# Patient Record
Sex: Female | Born: 1935 | Race: White | Hispanic: No | Smoking: Former smoker
Health system: Southern US, Community
[De-identification: ages and names within clinical notes are randomized; demographics above are authoritative.]

## PROBLEM LIST (undated history)

## (undated) DIAGNOSIS — I10 Essential (primary) hypertension: Secondary | ICD-10-CM

## (undated) DIAGNOSIS — F329 Major depressive disorder, single episode, unspecified: Secondary | ICD-10-CM

## (undated) DIAGNOSIS — K219 Gastro-esophageal reflux disease without esophagitis: Secondary | ICD-10-CM

## (undated) DIAGNOSIS — F419 Anxiety disorder, unspecified: Secondary | ICD-10-CM

## (undated) DIAGNOSIS — K5790 Diverticulosis of intestine, part unspecified, without perforation or abscess without bleeding: Secondary | ICD-10-CM

## (undated) DIAGNOSIS — G4730 Sleep apnea, unspecified: Secondary | ICD-10-CM

## (undated) DIAGNOSIS — E079 Disorder of thyroid, unspecified: Secondary | ICD-10-CM

## (undated) DIAGNOSIS — M1990 Unspecified osteoarthritis, unspecified site: Secondary | ICD-10-CM

## (undated) DIAGNOSIS — C801 Malignant (primary) neoplasm, unspecified: Secondary | ICD-10-CM

## (undated) DIAGNOSIS — F32A Depression, unspecified: Secondary | ICD-10-CM

## (undated) DIAGNOSIS — R0602 Shortness of breath: Secondary | ICD-10-CM

## (undated) HISTORY — DX: Unspecified osteoarthritis, unspecified site: M19.90

## (undated) HISTORY — PX: JOINT REPLACEMENT: SHX530

## (undated) HISTORY — PX: ABDOMINAL HYSTERECTOMY: SHX81

## (undated) HISTORY — PX: TUBAL LIGATION: SHX77

## (undated) HISTORY — DX: Disorder of thyroid, unspecified: E07.9

## (undated) HISTORY — DX: Essential (primary) hypertension: I10

---

## 1974-09-25 HISTORY — PX: OTHER SURGICAL HISTORY: SHX169

## 1981-09-25 HISTORY — PX: THYROID SURGERY: SHX805

## 1988-09-25 HISTORY — PX: OTHER SURGICAL HISTORY: SHX169

## 1990-09-25 HISTORY — PX: OTHER SURGICAL HISTORY: SHX169

## 1995-09-26 HISTORY — PX: OTHER SURGICAL HISTORY: SHX169

## 1999-09-26 HISTORY — PX: BREAST SURGERY: SHX581

## 2000-08-13 ENCOUNTER — Encounter: Admission: RE | Admit: 2000-08-13 | Discharge: 2000-11-11 | Payer: Self-pay | Admitting: *Deleted

## 2002-09-25 HISTORY — PX: OTHER SURGICAL HISTORY: SHX169

## 2003-01-29 ENCOUNTER — Encounter: Payer: Self-pay | Admitting: Orthopedic Surgery

## 2003-02-05 ENCOUNTER — Observation Stay (HOSPITAL_COMMUNITY): Admission: RE | Admit: 2003-02-05 | Discharge: 2003-02-06 | Payer: Self-pay | Admitting: Orthopedic Surgery

## 2004-04-21 ENCOUNTER — Encounter: Payer: Self-pay | Admitting: Orthopedic Surgery

## 2004-05-06 ENCOUNTER — Encounter: Payer: Self-pay | Admitting: Orthopedic Surgery

## 2004-05-06 ENCOUNTER — Inpatient Hospital Stay (HOSPITAL_COMMUNITY): Admission: RE | Admit: 2004-05-06 | Discharge: 2004-05-12 | Payer: Self-pay | Admitting: Orthopedic Surgery

## 2004-05-12 ENCOUNTER — Inpatient Hospital Stay: Admission: AD | Admit: 2004-05-12 | Discharge: 2004-05-27 | Payer: Self-pay | Admitting: Internal Medicine

## 2004-08-04 ENCOUNTER — Ambulatory Visit: Payer: Self-pay | Admitting: Orthopedic Surgery

## 2004-11-07 ENCOUNTER — Ambulatory Visit: Payer: Self-pay | Admitting: Orthopedic Surgery

## 2005-01-23 ENCOUNTER — Ambulatory Visit: Payer: Self-pay | Admitting: Orthopedic Surgery

## 2005-05-08 ENCOUNTER — Ambulatory Visit: Payer: Self-pay | Admitting: Orthopedic Surgery

## 2006-04-18 ENCOUNTER — Ambulatory Visit: Payer: Self-pay | Admitting: Orthopedic Surgery

## 2006-07-31 ENCOUNTER — Ambulatory Visit: Payer: Self-pay | Admitting: Gastroenterology

## 2006-08-22 ENCOUNTER — Ambulatory Visit: Payer: Self-pay | Admitting: Gastroenterology

## 2006-08-22 ENCOUNTER — Ambulatory Visit (HOSPITAL_COMMUNITY): Admission: RE | Admit: 2006-08-22 | Discharge: 2006-08-22 | Payer: Self-pay | Admitting: Gastroenterology

## 2006-09-13 ENCOUNTER — Ambulatory Visit: Payer: Self-pay | Admitting: Gastroenterology

## 2007-03-18 ENCOUNTER — Ambulatory Visit: Payer: Self-pay | Admitting: Orthopedic Surgery

## 2007-05-08 ENCOUNTER — Ambulatory Visit: Payer: Self-pay | Admitting: Orthopedic Surgery

## 2007-06-24 ENCOUNTER — Ambulatory Visit: Payer: Self-pay | Admitting: Orthopedic Surgery

## 2007-06-25 ENCOUNTER — Encounter: Payer: Self-pay | Admitting: Orthopedic Surgery

## 2008-05-28 ENCOUNTER — Ambulatory Visit: Payer: Self-pay | Admitting: Orthopedic Surgery

## 2008-05-28 DIAGNOSIS — M25569 Pain in unspecified knee: Secondary | ICD-10-CM | POA: Insufficient documentation

## 2008-05-28 DIAGNOSIS — M4120 Other idiopathic scoliosis, site unspecified: Secondary | ICD-10-CM | POA: Insufficient documentation

## 2008-05-28 DIAGNOSIS — M545 Low back pain, unspecified: Secondary | ICD-10-CM | POA: Insufficient documentation

## 2008-05-28 DIAGNOSIS — Z96659 Presence of unspecified artificial knee joint: Secondary | ICD-10-CM | POA: Insufficient documentation

## 2008-06-11 ENCOUNTER — Encounter: Payer: Self-pay | Admitting: Orthopedic Surgery

## 2008-07-04 ENCOUNTER — Encounter: Payer: Self-pay | Admitting: Orthopedic Surgery

## 2008-07-13 ENCOUNTER — Ambulatory Visit: Payer: Self-pay | Admitting: Orthopedic Surgery

## 2008-07-13 DIAGNOSIS — M5430 Sciatica, unspecified side: Secondary | ICD-10-CM | POA: Insufficient documentation

## 2008-07-27 ENCOUNTER — Ambulatory Visit: Payer: Self-pay | Admitting: Orthopedic Surgery

## 2008-10-07 ENCOUNTER — Ambulatory Visit: Payer: Self-pay | Admitting: Orthopedic Surgery

## 2008-10-09 ENCOUNTER — Telehealth: Payer: Self-pay | Admitting: Orthopedic Surgery

## 2008-10-13 ENCOUNTER — Ambulatory Visit (HOSPITAL_COMMUNITY): Admission: RE | Admit: 2008-10-13 | Discharge: 2008-10-13 | Payer: Self-pay | Admitting: Orthopedic Surgery

## 2008-10-19 ENCOUNTER — Ambulatory Visit: Payer: Self-pay | Admitting: Orthopedic Surgery

## 2008-10-19 DIAGNOSIS — M5137 Other intervertebral disc degeneration, lumbosacral region: Secondary | ICD-10-CM | POA: Insufficient documentation

## 2009-04-05 ENCOUNTER — Ambulatory Visit: Payer: Self-pay | Admitting: Orthopedic Surgery

## 2010-01-10 ENCOUNTER — Ambulatory Visit: Payer: Self-pay | Admitting: Cardiology

## 2010-04-29 ENCOUNTER — Encounter: Payer: Self-pay | Admitting: Orthopedic Surgery

## 2010-05-11 ENCOUNTER — Ambulatory Visit: Payer: Self-pay | Admitting: Orthopedic Surgery

## 2010-05-11 DIAGNOSIS — IMO0002 Reserved for concepts with insufficient information to code with codable children: Secondary | ICD-10-CM | POA: Insufficient documentation

## 2010-05-11 DIAGNOSIS — M1710 Unilateral primary osteoarthritis, unspecified knee: Secondary | ICD-10-CM

## 2010-10-11 ENCOUNTER — Ambulatory Visit
Admission: RE | Admit: 2010-10-11 | Discharge: 2010-10-11 | Payer: Self-pay | Source: Home / Self Care | Attending: Orthopedic Surgery | Admitting: Orthopedic Surgery

## 2010-10-25 NOTE — Letter (Signed)
Summary: Medical record request [facility]   Medical record request [facility]   Imported By: Cammie Sickle 05/17/2010 12:01:04  _____________________________________________________________________  External Attachment:    Type:   Image     Comment:   External Document

## 2010-10-25 NOTE — Assessment & Plan Note (Signed)
Summary: 1 yr xr tka/blue med/medicaid/bsf   Visit Type:  Follow-up  CC:  right knee TKA.  History of Present Illness: This is a 77 year old female who had a RIGHT total knee done in August of 2005 and presents back for her annual followup.  She had a [employer] knee put in  Xrays today.  Medications: updated in meds list. She is also taking One a day vitamins, Calcium , D-3, and Loratadine 10mg .  She complains of pain in her LEFT knee with some some discomfort swelling and tightness.  She feels is swollen.  She's having some giving way episodes and requires the use of a cane.  She would like an injection in the LEFT knee  The RIGHT knee gets a little sore at times but has been functioning well for 6 years.  Allergies: No Known Drug Allergies  Past History:  Past Medical History: Last updated: 07/13/2008 arthritis htn thyroid  Past Surgical History: Last updated: 07/13/2008 tubal ligation broken rt ankle 1976 broken radius ulna 1990 thyroid surgery 1983 rt foot bone spur removed 1992 arthroscopic surgery rt knee 1997 left knee 2004 breast surgery 2001 left/ cancer  Physical Exam  Additional Exam:  this is a well developed well-nourished female in no acute distress she is awake alert nor at x3 her mood and affect are normal.  She ambulates with a cane with a slight waddling gait.  She has increased lumbar lordosis.  Her RIGHT knee has approximately 110 of flexion with full extension.  The knee is stable in the anteroposterior plane as well as the medial lateral plane.  She has good strength in the quadriceps.  Her skin incision is normal with no tenderness.  She has minimal peripheral edema normal temperature and her RIGHT leg and her sensation is normal  The LEFT leg may have a small joint effusion flexion is 105 there is full extension with medial and lateral joint line tenderness.  The ACL and PCL are stable collateral ligaments are normal.  There is normal strength  skin is normal.  Temperature LEFT leg normal.  No edema.   Impression & Recommendations:  Problem # 1:  TOTAL KNEE REPLACEMENT, RIGHT, HX OF (ICD-V43.65) Assessment Comment Only  RIGHT knee  The  alignment was normal, PTF reduced without tilt or subluxation, no evidence of loosening.  IMPRESSION: normal appearance of the implant   Orders: Knee x-ray,  3 views (73710)  Problem # 2:  OSTEOARTHRITIS, KNEE, LEFT (ICD-715.96) Assessment: Deteriorated  LEFT knee injection Verbal consent was obtained. The knee was prepped with alcohol and ethyl chloride. 1 cc of depomedrol 40mg /cc and 4 cc of lidocaine 1% was injected. there were no complications.  Other Orders: Est. Patient Level IV (62694) Joint Aspirate / Injection, Large (20610) Depo- Medrol 40mg  (J1030)  Patient Instructions: 1)  Please schedule a follow-up appointment in 1 year. 2)  xray  Appended Document: 1 yr xr tka/blue med/medicaid/bsf review of systems.  The patient tells me that she's been having severe swelling episodes and went to see the gynecologist and he and he took some blood work and we'll work that up for her.  She is also in the 10th year post lumpectomy for breast cancer and has been cancer free and we'll see her surgeon regarding followup for that.  As far as her back goes she's had minimal to no discomfort in her lumbar spine.

## 2010-10-27 NOTE — Assessment & Plan Note (Signed)
Summary: left knee pain/no xrays [email or web address]   Visit Type:  Follow-up Primary Provider:  Dr. Garner Nash  CC:  left knee pain.  History of Present Illness: I saw [name] in the office today for a followup visit.  She is a 77 years old woman with the complaint of:  Left knee pain, hip and leg pain.  Last injection was in August 2011.  MRI L spine for review from [facility] 10/13/08, we ordered.  Had pain from her left hip to her ankle, used ice, heat, and PCP gave her cortisone injection in hip and this helps.  Has some back pain.  No back surgery in the past, no ESI's.  Takes Ibuprofen 800mg  two times a day and this helps, did take Tylenol Arthritis whe she had flare up did not help.  Pain level today is 0.  Her knee looks great.  excellent range of motion no tenderness pain or swelling has a negative straight leg raise.  Assessment probable bursitis since the injection helped but al involvement with pain along the lateral leg radiating from the hip  Patient will followup as scheduled for annual knee film on the RIGHT knee   Allergies: No Known Drug Allergies   Impression & Recommendations:  Problem # 1:  OSTEOARTHRITIS, KNEE, LEFT (ICD-715.96)  Orders: Est. Patient Level II (60454)  Problem # 2:  SCIATICA (ICD-724.3)  Orders: Est. Patient Level II (09811)  Patient Instructions: 1)  keep appt for august [tka xrays]   Orders Added: 1)  Est. Patient Level II [91478]

## 2011-02-10 NOTE — Op Note (Signed)
[name], [name]                        ACCOUNT NO.:  [id number]   MEDICAL RECORD NO.:  [id number]                   PATIENT TYPE:  OBV   LOCATION:  0443                                 FACILITY:  [facility]   PHYSICIAN:  Marlowe Kays, M.D.               DATE OF BIRTH:  1930/02/08   DATE OF PROCEDURE:  02/05/2003  DATE OF DISCHARGE:                                 OPERATIVE REPORT   PREOPERATIVE DIAGNOSES:  1. Torn lateral and possible torn medial menisci, left knee.  2. Degenerative arthritis, left knee.   POSTOPERATIVE DIAGNOSES:  1. Torn medial and lateral menisci.  2. Osteoarthritis, left knee.   OPERATION:  Left knee arthroscopy with 1) partial and medial lateral  meniscectomy, 2) shaving of the medial femoral condyle, lateral femoral  condyle and lateral tibial plateau.   SURGEON:  Marlowe Kays, M.D.   ASSISTANT:  Nurse.   ANESTHESIA:  General.   PATHOLOGY AND JUSTIFICATION FOR PROCEDURE:  She has had some chronic left  knee pain with MRI demonstrating possible torn lateral meniscus and medial  meniscus, also there is a popliteal cyst present.  She was also felt to  possibly have lateral subluxation of the patella but on subluxation films,  it was tracking normally. At surgery, I found a complex tear of the  posterior curve and horn of the medial meniscus, badly lacerated and  macerated lateral meniscus involving almost its entirety and grade 2-3  chondromalacia out of most of the medial femoral condyle, lateral femoral  condyle and grade 3/4 lateral tibial plateau. She also had some grade 2-3  wear of the patella.   DESCRIPTION OF PROCEDURE:  Satisfactory general anesthesia, Ace wrap to  right leg, pneumatic tourniquet, thigh stabilizer to the left leg which was  prepped with Duraprep from stabilizer to ankle, draped with Duraprep and  draped in a sterile field. I started out with the usual anterior medial and  lateral portals at the joint line and the  superior and medial portal for  inflow but because of problems getting good flow, I ended up with an  accessory superolateral portal as well. First through an anterolateral  portal, I evaluated the medial compartment of the knee joint, she had  partial detachment of a good bit of articular cartilage which was pictured  and smoothed down with a 3.5 shaver. The posterior horn tear of the medial  meniscus was noted and trimmed back with baskets and then shaved down until  smooth with a 3.5 shaver, final pictures being taken. I was unable to get my  scope up into the suprapatellar area from this portal and consequently  reversed portals. Her ACL was intact, she had a badly macerated lateral  meniscus involving the both the anterior, mid and posterior portions. A good  bit of this was resected with scissors and then remaining meniscus trimmed  back with baskets and shaved down until smooth with  a 3.5 shaver, final  pictures being taken. Also I shaved down the lateral femoral condyle and  also debrided up the lateral tibial plateau as well. I likewise tried to get  up in the lateral gutter and suprapatellar area from this portal as well and  was unable to do so, so I used the superomedial portal and used a  superolateral inflow portal to visualize the patella which had some wear but  nothing shaveable. The knee joint was then irrigated until clear and all  fluid possible removed. The two anterior portals were closed with 4-0 nylon.  20 mL of 0.5% Marcaine with adrenaline was then instilled through the in  flap rasp which was removed. The three portals were closed with 4-0 nylon  and I then closed the final portal with 4-0 nylon as well. Betadine Adaptic  dry sterile dressing were applied, tourniquet was released. The patient  tolerated the procedure well and was taken to the recovery room in  satisfactory condition with no known complications.                                                Marlowe Kays, M.D.    JA/MEDQ  D:  02/05/2003  T:  02/05/2003  Job:  [id number]

## 2011-02-10 NOTE — Consult Note (Signed)
[name], [name]            ACCOUNT NO.:  [id number]   MEDICAL RECORD NO.:  [id number]          PATIENT TYPE:  AMB   LOCATION:                                FACILITY:  [facility]   PHYSICIAN:  Kassie Mends, M.D.      DATE OF BIRTH:  1930/07/17   DATE OF CONSULTATION:  07/31/2006  DATE OF DISCHARGE:                                   CONSULTATION   REQUESTING PHYSICIAN:  Dr. Reuel Boom.   REASON FOR CONSULTATION:  Abdominal bloating.   HISTORY OF PRESENT ILLNESS:  [name] is 77 year old Caucasian female  referred through the courtesy of Dr. Reuel Boom with history of significant  abdominal bloating.  She also complains of intermittent diarrhea.  She had  been seen by Dr. Reuel Boom for this previously.  She underwent a CT scan of  abdomen with contrast on July 02, 2006.  She was found to have a small  hiatal hernia.  She was seen to have thickening of the gastric cardia.  She  was also found to have diverticulosis without evidence of diverticulitis,  cysts involving both ovaries and a small spiculated density in the left  breast status post left breast lumpectomy.  Given abnormal findings on CT,  she then underwent EGD by Dr. Reuel Boom on July 06, 2006; she was found to  have a hiatal hernia in the distal esophagus and otherwise normal exam.  She  also underwent pelvic ultrasound for follow-up on the ovarian cyst.  She was  found to have complex cystic lesion in the right ovary.  Overall appears  suggest cluster cyst or one dominant cyst with small septations.  She is  scheduled to have follow-up with Dr. Ralph Dowdy this month regarding this  finding.  She also underwent mammogram which was normal.  She had a CLO-test  which was negative.   She tells me she is taking Prilosec 20 mg daily for heartburn and  indigestion which does seem to work well for the last 9 months.  However,  she is complaining of diarrhea several times a week.  She says diarrhea is  usually worse postprandially.  She  can have severe agency to defecate.  She  also complains of upper abdominal pain.  She has had increased abdominal  girth.  She states she feels that she is 85-months pregnant.  She can have  two to three loose stools a day.  She denies any rectal bleeding, melena or  mucus in her stools.  Denies any problems with constipation.  She denies any  severe abdominal pain; it is mostly a uncomfortable sensation.  She has  severe urgency to defecate, has had episodes of fecal incontinence.  She has  had multiple episodes at least a dozen times where she has been awakened  from sleeping and at that time could not get to the bathroom before she was  incontinent of stool.   PAST MEDICAL HISTORY:  1. Vertigo.  2. Hypertension  3. Osteoarthritis.  4. She has history of hyperlipidemia.  5. Depression.  6. Hypokalemia.  7. Osteopenia.  8. Plantar fasciitis.  9. 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  Status post radiation and tamoxifen.  10.She has had a right knee replacement in 2005.  11.Left knee arthroscopy in 2004.  12.Thyroid nodule removed in 1985.  13.Right foot surgery in 1992.  14.Bell's palsy.  15.Bilateral cataracts in 2005.  16.Tubal ligation in 1970.  17.Right ankle fracture in 1976.  18.Left wrist fracture in 1990.  19.She tells me she had a normal colonoscopy in 2003.   CURRENT MEDICATIONS:  1. Diovan 160/12.5 mg daily.  2. KCl 20 mEq daily.  3. Meclizine 12.5 mg daily.  4. Atenolol 50 mg daily.  5. Sertraline HCl 100 mg daily.  6. Prilosec 20 mg daily.  7. Ibuprofen 200 mg 2 in the morning and 2 at bedtime.  8. Glucosamine 1 g daily.  9. Lorazepam 0.5 mg q.8 h. p.r.n.  10.Actifed p.r.n.   ALLERGIES:  No known drug allergies.   FAMILY HISTORY:  There is no known family history of colorectal carcinoma,  liver or chronic GI problems.  Mother deceased at age 52 secondary to CVA.  Father deceased at age 66 due to old age.  She has  one sister deceased with  metastatic breast carcinoma, one brother deceased secondary to heart  problems and one brother deceased secondary to lung carcinoma, four healthy  living siblings.   SOCIAL HISTORY:  [name] is a widow.  She lives alone.  She has 5 grown  healthy children.  She is retired from a service station.  She has a 20-pack-  year history of tobacco use, quitting 15 years ago.  Denies alcohol or drug  use.   REVIEW OF SYSTEMS:  CONSTITUTIONAL:  Her weight is up 20 pounds.  She does  complain of some hot flashes with diaphoresis.  She denies any fever or  chills.  She does complain of some fatigue.  CARDIOVASCULAR:  Denies any  chest pain or palpitations.  PULMONARY:  Denies any shortness of breath,  dyspnea, cough or hemoptysis.  GASTROINTESTINAL:  See HPI.  GENITOURINARY:  Denies any dysuria, hematuria, increased urinary frequency.   PHYSICAL EXAMINATION:  VITAL SIGNS:  Weight 210 pounds, height 60 inches,  temperature 97.9.  Blood pressure 118/84 and pulse 72.  GENERAL:  [name] is a well-developed, well-nourished elderly female  in no acute distress.  She is accompanied by her daughter today.  HEENT:  Sclerae clear and nonicteric.  Conjunctivae pink.  Oropharynx pink and moist  without lesions.  She has upper and lower dentures intact. NECK:  Supple  without any mass or thyromegaly.  CHEST:  Regular rate and rhythm, normal S1  and S2 without murmurs, rubs, clicks or gallops. LUNGS:  Clear to  auscultation bilaterally.  ABDOMEN:  Protuberant.  It is quite distended.  There is positive bowel  sounds x4.  No bruits auscultated.  Abdomen is soft, nontender without any  palpable mass or hepatosplenomegaly.  No rebound tenderness or guarding.  EXTREMITIES:  Without clubbing.  She does have 1+ lower extremity edema  bilaterally.   IMPRESSION:  [name] is a 77 year old Caucasian female with increased abdominal girth and abdominal bloating.  She has also  noticed episodic  diarrhea.  The diarrhea is quite.  She will have 3 large volume loose watery  stools usually postprandially, but she also has episodes where she is  awakened from sleeping on at least a dozen occasions.  She complains of  severe urgency and fecal incontinence.  She denies any severe abdominal  pain.  Most of her pain is bloating and uncomfortableness.  She has had a  normal EGD.  She has had a CT scan which has showed a right ovarian cyst  which she is going to be seen by Dr. Ralph Dowdy for further evaluation.  She has  a previous history of breast carcinoma.  Given these warning signs and the  fact that her colonoscopy was over 4 years ago, I feel she needs further  evaluation with colonoscopy.  I discussed this with her and her daughter at  great length; entire office visit was 1 hour.  I think given her age been  given her age it is very unlikely but not impossible that she would develop  IBS with no previous history of this.   PLAN:  1. Colonoscopy with Dr. Cira Servant in the near future.  I have discussed this      procedure including risks and benefits which include but are not      limited to bleeding, infection, perforation, drug reaction.  Prior to      leaving, she discussed with [place] that she did not wish to have      colonoscopy done here at [facility].  She would prefer to go      to [facility].  We explained that we at this time are      not doing procedures at [facility].  She is wanting to      have colonoscopy there which is fine.  2. We will be glad to see her pending colonoscopy if she chooses to do      this.  3. She has a follow-up with gynecology consult with Dr. Ralph Dowdy this month      regarding ovarian cyst.  4. Further recommendations pending colonoscopy.   We would like to thank Dr. Reuel Boom for allowing [place] to participate in the care  of [name].      Nicholas Lose, N.P.      Kassie Mends, M.D.   Electronically Signed    KC/MEDQ  D:  08/01/2006  T:  08/01/2006  Job:  [id number]   cc:   Donzetta Sprung  Fax: [phone]

## 2011-02-10 NOTE — Op Note (Signed)
[name], [name]            ACCOUNT NO.:  [id number]   MEDICAL RECORD NO.:  [id number]          PATIENT TYPE:  AMB   LOCATION:  DAY                           FACILITY:  [facility]   PHYSICIAN:  Kassie Mends, M.D.      DATE OF BIRTH:  [birth year]   DATE OF PROCEDURE:  08/22/2006  DATE OF DISCHARGE:                               OPERATIVE REPORT   REFERRING PHYSICIAN:  Donzetta Sprung   PROCEDURE:  Colonoscopy with cold forceps biopsy.   INDICATION FOR EXAM:  [name] is a 77 year old female with  intermittent watery diarrhea and fecal incontinence.  She has a past  medical history of breast cancer with radiation therapy.  She regularly  consumes milk, ice cream, and cheese.  She does have a gallbladder.  She  denies any fever or rectal bleeding.  Her symptoms have been improved  with Activa.   FINDINGS:  1. Normal colon without evidence of polyps, masses, inflammatory      changes, or vascular ectasias.  Random biopsies obtained via cold      forceps to evaluate for microscopic colitis.  2. Normal retroflexed view of the rectum.   RECOMMENDATIONS:  1. Continue Activa.  She has a follow-up appointment to see [name]      in three weeks.  I will call her with the results of her biopsies.  2. She should be on a high-fiber diet.  She is given a hand-out on      high-fiber diet.  3. She should avoid aspirin or anti-inflammatory drugs for the next      three days.   MEDICATIONS:  1. Demerol 125 mg IV.  2. Versed 10 mg IV.   PROCEDURAL TECHNIQUE:  Physical exam was performed, and informed consent  was obtained per the patient after explaining the risks, benefits and  alternatives to the procedure.  Patient was connected to the monitor and  placed in the left lateral decubitus position.  Continuous oxygen was  provided by nasal cannula, and IV medicine administered through an  indwelling cannula.  After administration of sedation, a rectal exam was  performed.  The patient has  poor rectal tone.  The patient's rectum was  intubated, and scope was advanced under direct visualization  to the cecum.  The scope was subsequently removed slowly by carefully  examining the colon texture, anatomy, and integrity of the mucosa on the  way out.  The patient was recovered in endoscopy suite and discharged  home in satisfactory condition.      Kassie Mends, M.D.  Electronically Signed     SM/MEDQ  D:  08/22/2006  T:  08/22/2006  Job:  [id number]   cc:   Donzetta Sprung  Fax: [phone]

## 2011-02-10 NOTE — Group Therapy Note (Signed)
NAME:  [name], [name]                      ACCOUNT NO.:  [id number]   MEDICAL RECORD NO.:  [id number]                   PATIENT TYPE:  ORB   LOCATION:  S154                                 FACILITY:  [facility]   PHYSICIAN:  Hanley Hays. Dechurch, M.D.           DATE OF BIRTH:  07/12/1930   DATE OF PROCEDURE:  DATE OF DISCHARGE:                                   PROGRESS NOTE   HISTORY OF PRESENT ILLNESS:  A 77 year old Caucasian female from [place],  [place] who is status post elective total knee replacement, on May 10, 2004, by Dr. Romeo Apple.  She had an uncomplicated hospital stay.  She did  have some postoperative delirium secondary to drugs but resolved completely  without sequelae.   PAST MEDICAL HISTORY:  1. Hypertension.  2. Breast cancer, status post lumpectomy and radiation therapy on Tamoxifen     (four out of five years).  3. History of depression, stable on Zoloft.  4. Postoperative anemia, hemoglobin 8.5, now on iron.  5. History of recurrent vertigo/labyrinthitis, none in two years.  6. History of gastroesophageal reflux.  7. History of benign nodule of thyroid.  8. Obesity.  9. History of right radius and ulna fracture.  10.      Right ankle surgery.  11.      Right bone spur.   MEDICATIONS:  1. Colace 100 b.i.d.  2. Lovenox 30 b.i.d.  3. Feosol 45 b.i.d.  4. Ativan 0.5 q.8h. p.r.n.  5. Meclizine 12.5 mg q.8h. p.r.n.  6. Protonix 40 mg daily.  7. Potassium 20 mEq b.i.d.  8. Zoloft 100 mg daily.  9. Tamoxifen 20 mg at h.s.  10.      Lortab one p.o. q.6h. p.r.n. pain.  11.      Tenormin 25 mg daily.  12.      Hydrochlorothiazide 12.5 daily.  13.      Avapro 150 daily.   SOCIAL HISTORY:  The patient has a 50 pack year history of smoking, quitting  ten years ago.  No alcohol abuse.  She currently lives with in [place]  independently.  She has been widowed x 20 years.  She has five children, all  live within an hour radius.   REVIEW OF SYSTEMS:  She  is very active with independent with ADLs only  limited by her arthritis.  Pretty much unremarkable except for weight gain  which she has noted to have been worse after discontinuation of her thyroid  supplement in 1988.   FAMILY MEDICAL HISTORY:  Pertinent for heart disease in her mother and  arthritis.   PHYSICAL EXAMINATION:  GENERAL:  Reveals a well-developed, well-nourished,  pleasant lady in no distress who is alert and appropriate with an  unremarkable neurologic exam.  VITAL SIGNS:  Her T-max was 100, currently 98.6, pulse 82, blood pressure is  117/55.  NECK:  Supple.  No JVD, adenopathy, or thyromegaly.  HEENT:  Oropharynx  is moist without lesion.  LUNGS:  Clear to auscultation anterior and posterior.  HEART:  Regular rate and rhythm.  No murmur, gallop or rub is noted.  BREAST:  Deferred.  ABDOMEN:  Obese, soft, and nontender.  EXTREMITIES:  Without clubbing cyanosis.  The right leg is diffusely  swollen.  There is no significant erythema or tenderness.  The incision is  clean and intact.  There is a little bruising on the inferior aspect of the  right leg but no other significant findings.  SKIN:  Reveals no breakdown or lesions.  She has a patchy irritant rash on  the left thigh, left wrist, left abdominal wall which looks like a contact  dermatitis and is resolving, according to the patient.  NEUROLOGIC:  Intact.   ASSESSMENT/PLAN:  80. A 77 year old status post elective total knee repair secondary to severe     osteoarthritis who has done well postoperatively.  Does need to continue     with therapy.  Advised on utilizing her pain medication readily until     pain is resolved.  2. History of hypertension, stable on the current regimen.  No changes.  3. Postoperative anemia.  We will followup a CBC in 1-2 weeks and continue     her iron therapy.   DISPOSITION:  The patient will plan to be discharged to home once she has  reached the end point of her inpatient  therapy.  She is very optimistic and  should do quite well barring any unforeseen circumstances.  Continue the  Lovenox until she is fully ambulatory or four weeks.  As far as her knee is  concerned, there is no evidence of infection or need for further  intervention.  The patient was reassured.      ___________________________________________                                            Hanley Hays. Josefine Class, M.D.   FED/MEDQ  D:  05/14/2004  T:  05/14/2004  Job:  [id number]   cc:   [facility]

## 2011-02-10 NOTE — Procedures (Signed)
NAME:  [name], [name]                      ACCOUNT NO.:  [id number]   MEDICAL RECORD NO.:  [id number]                   PATIENT TYPE:  AMB   LOCATION:  DAY                                  FACILITY:  [facility]   PHYSICIAN:  Edward L. Juanetta Gosling, M.D.             DATE OF BIRTH:  [birth year]   DATE OF PROCEDURE:  DATE OF DISCHARGE:                                EKG INTERPRETATION   TIME AND DATE OF PROCEDURE:  At 10:07 on May 04, 2004.   The rhythm is sinus rhythm with a rate 70.  There are what appear to be PACs  with aberrant conduction.  Slow R wave progression across the precordium and  has had a previous anterior infarction.  Clinical correlation is suggested.  There is little positive voltage inferiorly which could indicate a previous  inferior myocardial infarction and clinical correlation is suggested.  Abnormal electrocardiogram.      ___________________________________________                                            Oneal Deputy. Juanetta Gosling, M.D.   ELH/MEDQ  D:  05/04/2004  T:  05/04/2004  Job:  [id number]

## 2011-02-10 NOTE — H&P (Signed)
NAMESIOMARA, BURKEL NO.:  [id number]   MEDICAL RECORD NO.:  [id number]                  PATIENT TYPE:   LOCATION:                                       FACILITY:   PHYSICIAN:  Vickki Hearing, M.D.           DATE OF BIRTH:  Oct 06, 1929   DATE OF ADMISSION:  DATE OF DISCHARGE:                                HISTORY & PHYSICAL   CHIEF COMPLAINT:  Right knee pain.   HISTORY:  This is a 77 year old female with bilateral knee arthritis who  presents with right greater than left knee pain and an arthroscopy in 1997  by Dr. __________ and then an arthroscopy in 2004 on the left knee by Dr.  Simonne Come.  First knee arthroscopy was on the right knee.   PAST SURGICAL HISTORY:  The patient has a long surgical history including a  thyroidectomy, tubal ligation, surgery on her right ankle, right radius and  ulnar fracture.  She has had breast surgery on the left, bone spur removed  from the top of the right foot.   MEDICATIONS:  She takes potassium, Zoloft 100 mg, Diovan and antacid when  needed, tamoxifen, atenolol, lorazepam, ibuprofen, and aspirin.   FAMILY HISTORY:  Heart disease, arthritis and cancer.   FAMILY PHYSICIAN:  Donzetta Sprung, M.D.   SOCIAL HISTORY:  Widowed, lives alone.  No smoking, no drinking.  Has her  education completed through 12 grades.   PHYSICAL EXAMINATION:  VITAL SIGNS:  Weight is 192, pulse 82, respiratory  rate 20.  GENERAL:  She is well-developed, nourished.  Grooming and hygiene are  normal.  Body habitus large.  There is no swelling, mild varicosities are  seen.  She has normal pulses.  EXTREMITIES:  Warm without edema or tenderness.  Knee motion is only 90  degrees in both knees.  Lateral joint line pain on the right knee with  obvious deformity.  Her knee is stable.  Muscle strength and tone are  normal.  Extensor mechanism is intact and extremities have normal alignment,  range of motion, strength, and stability.  LYMPH NODES:  Neck normal.  NEUROLOGIC: Gait and station antalgic.  Coordination by finger-to-nose is  normal.  Deep tendon reflexes normal.  Sensation normal.  She is alert and  oriented x3.  No depression, anxiety or agitation.  SKIN:  No rashes or scars.   LABORATORY DATA:  Radiographs show valgus arthritis of the right knee.  Left  knee joint spaces are preserved but there is diffuse narrowing.   DIAGNOSIS:  Osteoarthritis, right knee.   PLAN:  Recommend right total knee replacement.  Overall risks and  complications explained.  The patient agrees to go ahead with surgery for  total knee replacement on the right knee.     ___________________________________________  Vickki Hearing, M.D.   SEH/MEDQ  D:  04/23/2004  T:  04/23/2004  Job:  [id number]

## 2011-02-10 NOTE — Op Note (Signed)
NAME:  [name], [name]                      ACCOUNT NO.:  [id number]   MEDICAL RECORD NO.:  [id number]                   PATIENT TYPE:  AMB   LOCATION:  DAY                                  FACILITY:  [facility]   PHYSICIAN:  Vickki Hearing, M.D.           DATE OF BIRTH:  November 09, 1929   DATE OF PROCEDURE:  05/06/2004  DATE OF DISCHARGE:                                 OPERATIVE REPORT   INDICATIONS FOR PROCEDURE:  Knee pain.   HISTORY:  A 77 year old female with persistent and increasing right knee  pain associated with osteoarthritis.   PREOPERATIVE DIAGNOSIS:  Osteoarthritis, right knee.   POSTOPERATIVE DIAGNOSIS:  Osteoarthritis, right knee.   PROCEDURE:  Right total knee arthroplasty.   COMPONENTS:  [employer]ker Scorpio posterior stabilized knee, 8 mm patella  component, 7 tibial component, 7 femoral component, 15-mm polyethylene  insert.   SURGEON:  Vickki Hearing, M.D.   FINDINGS:  Degenerative cartilaginous surfaces of tibia, femur, and patella.   ESTIMATED BLOOD LOSS:  Zero.   TOURNIQUET TIME:  1 hour 30 minutes.   DESCRIPTION OF PROCEDURE:  The patient was identified in the preoperative  holding area.  The medical record was reviewed, including consent and  history.  My initials were placed over the surgical site as indicated by the  patient prior to any sedatives given.  Preoperative antibiotics were given.  The patient was taken to the operating room for spinal anesthetic.   After spinal anesthetic, a Foley catheter was placed.  The right knee was  prepped and draped using sterile technique.  A mandatory time out was taken.  Everyone concurred on the procedure as right total knee on [name].  Schaefer.  Antibiotics were confirmed to be given one hour prior to skin  incision.   The right lower extremity was exsanguinated with a 4-inch Esmarch, and the  tourniquet was inflated to 300 mmHg.   An incision was made over the right knee centered over the  patella.  The  dissection was carried to the subcutaneous tissue and to the extensor  mechanism.  The joint was entered via medial arthrotomy.  A complete  synovectomy was performed.  The cruciates and menisci were removed.  The  medial subperiosteal sleeve was elevated to the midportion of the tibia.  The knee was flexed to 90 degrees.   The tibial guides were set to neutral alignment with a 0-degree posterior  slope.  A 10-mm resection was taken from the medial side.  The tibial  baseplate measured a size 7.  The femoral canal was entered with a three-  eights inch drill bit and decompressed with a suction and fluted guide rod.  A 10-mm distal resection was made, and this was followed up with a +2  resection.  The femur was measured to a size 7 using the Whitesides line and  epicondyle axis to control rotation.  A four-in-one cutting block was used  to make four femoral cuts with 3 degrees of external rotation, and the box  cut was followed.   Trial reduction was performed.  The cuts were acceptable.  A iliotibial band  cross incision was made, as described by [place], and progressive varus was  performed until the ITB was released.  The patella was then remeasured for  thickness.  It measured a size 22.  It was cut to a 14.  The trial was  placed, and the tracking was checked.  No lateral release was needed.   The knee was irrigated.  The bone was dried.  The prosthesis was cemented  into place with a size 7 femur, size 7 tibia, and a size 7 patella.   The trial 15 and 12 inserts were placed.  The 15 foot fit best.   A Hemovac drain was placed.  The knee was closed in layers with #1 Bralon  and 2-0 Vicryl.  A subcutaneous pain pump was placed.  A CryoCuff was placed  over a sterile dressing.  The patient was taken to the recovery room in  stable condition.  Normal postoperative total knee protocol.      ___________________________________________                                             Vickki Hearing, M.D.   SEH/MEDQ  D:  05/06/2004  T:  05/06/2004  Job:  [id number]

## 2011-02-10 NOTE — Discharge Summary (Signed)
NAME:  [name], [name]                      ACCOUNT NO.:  [id number]   MEDICAL RECORD NO.:  [id number]                   PATIENT TYPE:  INP   LOCATION:  A339                                 FACILITY:  [facility]   PHYSICIAN:  Vickki Hearing, M.D.           DATE OF BIRTH:  [birth year]   DATE OF ADMISSION:  05/06/2004  DATE OF DISCHARGE:  05/10/2004                                 DISCHARGE SUMMARY   ADMISSION DIAGNOSES:  Osteoarthritis right knee.   DISCHARGE DIAGNOSES:  Osteoarthritis right knee.   PROCEDURE:  Right total knee replacement.   IMPLANTS USED:  We used a size 7 femur, size 7 patella, 8 mm thickness. Used  a size 7 tibia. Used a 15 mm polyethylene insert. The prosthesis was  cemented and it was an IT sales professional knee system from the [employer]  total knee system. It was done under spinal.   HOSPITAL COURSE:  This patient did well after surgery. She was started on a  PCA, oral pain medicine, and an IQ pain pump. She progressed well in  therapy. Range of motion was 0 to 65 degrees. At the time of discharge, her  CPM had increased to 70. She was walking with standby assistance  approximately 20 feet. Her surgical incision looked very good. She had no  signs of DVT. She was awake and alert. Her last hemoglobin  was 8.5 on the  15th. This was stable. It was also 8.6 on the 14th. She is on iron. Her  chemistry 7 showed her potassium was 3.2. The only problems that we  encountered during the postoperative period was hypokalemia with potassium  of 2.9 on the 14th and low blood pressure's, which was treated with fluid  replacement and holding of blood pressure medications.   CONDITION ON DISCHARGE:  Improved.   DISPOSITION:  To skilled nursing facility.   WOUND INSTRUCTIONS:  On May 16, 2004, the staples can be taken out of the  right knee. Physical and occupational therapy should continue. The patient  is full weight bearing.   DISCHARGE MEDICATIONS:  1. Colace  100 mg b.i.d.  2. Lovenox 30 mg subcutaneous b.i.d. Stop date is June 06, 2004.  3. Feosol 45 mg b.i.d.  4. Ativan 0.5 mg q. 8 hours as needed.  5. Milk of Magnesia 30 cc p.o. q. daily as needed.  6. Meclozine 12.5 mg p.o. q. 8.  7. Protonix 40 mg p.o. q.d. WC.  8. Potassium 20 meq p.o. b.i.d.  9. Zoloft 100 mg p.o. daily.  10.      Tamoxifen 20 mg q.h.s.  11.      Lortab 1 tablet p.o. q. 6 p.r.n. for pain.  12.      Tenormin 50 mg 1/2 tablet daily.  13.      Hydrochlorothiazide 12.5 mg daily.  14.      Avapro 300 mg 1/2 tablet daily.     ___________________________________________  Vickki Hearing, M.D.   SEH/MEDQ  D:  05/10/2004  T:  05/10/2004  Job:  [id number]

## 2011-02-10 NOTE — Group Therapy Note (Signed)
NAME:  BRENLEY, PRIORE                      ACCOUNT NO.:  [id number]   MEDICAL RECORD NO.:  [id number]                   PATIENT TYPE:  ORB   LOCATION:  S154                                 FACILITY:  [facility]   PHYSICIAN:  Hanley Hays. Dechurch, M.D.           DATE OF BIRTH:  July 17, 1930   DATE OF PROCEDURE:  DATE OF DISCHARGE:                                   PROGRESS NOTE   Ms. Koelling's urine culture grew 80,000 colonies of Klebsiella pneumoniae.  Her urinalysis did not reveal any significant pyuria.  However, given her  low grade fevers and recent Foley catheter combined with new hip and single  organism, we will go ahead and treat her with Bactrim DS b.i.d. for 5 days.  She has no urinary symptoms at this time, though some frequency, which has  been chronic.  In any event, she is otherwise doing well.  No other changes  in her regimen at this time.      ___________________________________________                                            Hanley Hays Josefine Class, M.D.   FED/MEDQ  D:  05/17/2004  T:  05/17/2004  Job:  [id number]

## 2011-05-03 ENCOUNTER — Ambulatory Visit: Payer: Self-pay | Admitting: Orthopedic Surgery

## 2011-05-18 ENCOUNTER — Encounter: Payer: Self-pay | Admitting: Orthopedic Surgery

## 2011-05-18 ENCOUNTER — Ambulatory Visit (INDEPENDENT_AMBULATORY_CARE_PROVIDER_SITE_OTHER): Payer: Medicare Other | Admitting: Orthopedic Surgery

## 2011-05-18 DIAGNOSIS — Z96659 Presence of unspecified artificial knee joint: Secondary | ICD-10-CM

## 2011-05-18 NOTE — Progress Notes (Signed)
Chief complaint: Annual followup status post knee replacement HPI:(1) 7 year followup on RIGHT total knee arthroplasty  ROS:(1) No complaints regarding her knee     Physical Exam(6) GENERAL: normal development   CDV: pulses are normal   Skin: normal  Psychiatric: awake, alert and oriented  Neuro: normal sensation  MSK Ambulation is with a cane. 1 ROM = 115 2 motor 5/5 3 stable AP and ML    Imaging: Separately identifiable. X-ray report.  Total knee replacement annual x-ray. Right knee  All 3 components are properly aligned. Overall knee alignment is normal. No signs of loosening.  Impression no complicating findings and his postoperative knee replacement film    Assessment:  Stable TKA    Plan: 1 year annual x-ray

## 2011-10-31 ENCOUNTER — Ambulatory Visit (INDEPENDENT_AMBULATORY_CARE_PROVIDER_SITE_OTHER): Payer: Medicare Other | Admitting: Orthopedic Surgery

## 2011-10-31 ENCOUNTER — Encounter: Payer: Self-pay | Admitting: Orthopedic Surgery

## 2011-10-31 VITALS — BP 104/78 | Ht 59.0 in | Wt 200.0 lb

## 2011-10-31 DIAGNOSIS — IMO0002 Reserved for concepts with insufficient information to code with codable children: Secondary | ICD-10-CM

## 2011-10-31 DIAGNOSIS — M1710 Unilateral primary osteoarthritis, unspecified knee: Secondary | ICD-10-CM

## 2011-10-31 NOTE — Progress Notes (Signed)
Patient ID: [name], female   DOB: 04/20/1930, 79 y.o.   MRN: [id number]  LEFT knee pain.  The patient has continued LEFT knee pain, which is causing her some disability at this time. However, she just lost her grandson and will not schedule surgery at this time.  She does use a cane. She has some vertigo.  Review of systems negative.  Ambulation today without assistive device. Her knee is in varus alignment. She has medial joint line tenderness, 120 of flexion. The knee is otherwise stable. Neurovascular exam is normal. Muscle tone and strength are normal in the LEFT lower extremity.  The last x-ray was in 2009.  She is considering surgery in March. She'll come back at that time for an x-ray in preparation for, surgery. She'll also have preop visit at that time.   Knee  Injection Procedure Note  Pre-operative Diagnosis: left knee oa  Post-operative Diagnosis: same  Indications: pain  Anesthesia: ethyl chloride   Procedure Details   Verbal consent was obtained for the procedure. Time out was completed.The joint was prepped with alcohol, followed by  Ethyl chloride spray and A 20 gauge needle was inserted into the knee via lateral approach; 4ml 1% lidocaine and 1 ml of depomedrol  was then injected into the joint . The needle was removed and the area cleansed and dressed.  Complications:  None; patient tolerated the procedure well.

## 2011-10-31 NOTE — Patient Instructions (Signed)
You have received a steroid shot. 15% of patients experience increased pain at the injection site with in the next 24 hours. This is best treated with ice and tylenol extra strength 2 tabs every 8 hours. If you are still having pain please call the office.    

## 2011-12-19 ENCOUNTER — Ambulatory Visit: Payer: Medicare Other | Admitting: Orthopedic Surgery

## 2012-01-10 ENCOUNTER — Encounter: Payer: Self-pay | Admitting: Orthopedic Surgery

## 2012-01-10 ENCOUNTER — Ambulatory Visit (INDEPENDENT_AMBULATORY_CARE_PROVIDER_SITE_OTHER): Payer: Medicare Other

## 2012-01-10 ENCOUNTER — Other Ambulatory Visit: Payer: Self-pay | Admitting: Orthopedic Surgery

## 2012-01-10 ENCOUNTER — Ambulatory Visit (INDEPENDENT_AMBULATORY_CARE_PROVIDER_SITE_OTHER): Payer: Medicare Other | Admitting: Orthopedic Surgery

## 2012-01-10 VITALS — Ht 59.0 in | Wt 200.0 lb

## 2012-01-10 DIAGNOSIS — M1710 Unilateral primary osteoarthritis, unspecified knee: Secondary | ICD-10-CM

## 2012-01-10 DIAGNOSIS — R52 Pain, unspecified: Secondary | ICD-10-CM

## 2012-01-10 DIAGNOSIS — IMO0002 Reserved for concepts with insufficient information to code with codable children: Secondary | ICD-10-CM

## 2012-01-10 NOTE — Progress Notes (Signed)
Patient ID: [name], female   DOB: April 08, 1930, 78 y.o.   MRN: [id number]   Chief Complaint  Patient presents with  . Follow-up    Recheck left knee with xrays for preop.   The patient has pain in her LEFT knee, catching and. The pain has become unbearable for her. She's had progressive changes in her activities of daily living with difficulty squatting bending, kneeling, eating out of a chair. She is now ready for knee replacement. He understands the risks and benefits of the knee replacement after successfully undergoing RIGHT total knee arthroplasty.    Past Medical History  Diagnosis Date  . Arthritis   . HTN (hypertension)   . Thyroid dysfunction     Past Surgical History  Procedure Date  . Tubal ligation   . Broken right ankle 1976  . Broken radius ulna 1990  . Thyroid surgery 1983  . Rt foot bone spur removed 1992  . Arthroscopic surgery rt knee 1997  . Left knee 2004  . Breast surgery 2001    LEFT/ CANCER   . Joint replacement right knee 2005 [name]    History   Social History  . Marital Status: Widowed    Spouse Name: N/A    Number of Children: N/A  . Years of Education: N/A   Occupational History  . Not on file.   Social History Main Topics  . Smoking status: Never Smoker   . Smokeless tobacco: Not on file  . Alcohol Use: No  . Drug Use: No  . Sexually Active: Not on file   Other Topics Concern  . Not on file   Social History Narrative  . No narrative on file    Review of Systems  Constitutional: Negative.   HENT: Negative.   Eyes: Negative.   Respiratory: Negative.   Cardiovascular: Negative.   Gastrointestinal: Negative.   Genitourinary: Negative.   Musculoskeletal: Positive for back pain.  Skin: Negative.   Neurological: Negative.   Endo/Heme/Allergies: Negative.   Psychiatric/Behavioral: Negative.     Physical Exam(12) GENERAL: normal development  [place] normal. Hygiene, normal. Body habitus, endomorphic.  CDV:  pulses are normal   Skin: normal  Lymph: nodes were not palpable/normal  Psychiatric: awake, alert and oriented  Neuro: normal sensation  Upper extremity exam  Inspection and palpation revealed no abnormalities in the upper extremities.  Range of motion is full without contracture.  Motor exam is normal with grade 5 strength.  The joints are fully reduced without subluxation.  There is no atrophy or tremor and muscle tone is normal.  All joints are stable.  The LEFT knee flexes 95. She has valgus deformity. Lateral joint line tenderness. Small joint effusion. Strength and muscle tone are normal. Collateral ligaments are stable. No flexion contracture was detected. RIGHT knee flexion, 105. Normal strength, stability, and appearance  X rays see report Valgus osteoarthritis, LEFT knee  Assessment: oa left knee     Plan: left tka

## 2012-01-10 NOTE — Patient Instructions (Signed)
Total Knee Replacement In total knee replacement, the damaged knee is replaced with an artificial knee joint (prosthesis). The purpose of this surgery is to reduce pain and improve your range of motion. Regaining a near normal range of motion of your knee in the first 3 to 6 weeks after surgery is critical. Generally, these artificial joints last a minimum of 10 years. By that time, about 1 in 10 patients will need another surgery to repair the loose prosthesis. LET YOUR CAREGIVER KNOW ABOUT:    Allergies to food or medicine.   Medicines taken, including vitamins, herbs, eyedrops, over-the-counter medicines, and creams.   Use of steroids (by mouth or creams).   Previous problems with anesthetics or numbing medicines.   History of bleeding problems or blood clots.   Previous surgery.   Other health problems, including diabetes and kidney problems.   Possibility of pregnancy, if this applies.  RISKS AND COMPLICATIONS    Knee pain.   Loss of range of motion of the knee (stiffness) or instability.   Loosening of the prosthesis.   Infection.  BEFORE THE PROCEDURE    If you smoke, quit.   You may need a replacement or addition of blood (transfusion) during this procedure. You may want to donate your own blood for storage several weeks before the procedure. This way, your own blood can be stored and given to you if needed. Talk to your surgeon about this option.   Do not eat or drink anything for as long as directed by your caregiver before the procedure.   You may bathe and brush your teeth before the procedure. Do not swallow the water when brushing your teeth.   Scrub the area to be operated on for 5 minutes in the morning before the procedure. Use special soap if you are directed to do so by your caregiver.   Take your regular medicines with a small sip of water unless otherwise instructed. Your caregiver will let you know if there are medicines that need to be stopped and for  how long.   You should be present 60 minutes before your procedure or as directed by your caregiver.  PROCEDURE   Before surgery, an intravenous (IV) access for giving fluids will be started. You will be given medicines and/or gas to make you sleep (anesthetic). You may be given medicines in your back with a needle to make you numb from the waist down. Your surgeon will take out any damaged cartilage and bone. He or she will then put in new metal, plastic, or ceramic joint surfaces to restore alignment and function to your knee. AFTER THE PROCEDURE   You will be taken to the recovery area where a nurse will watch and check your progress. You may have a long, narrow tube (catheter) in your bladder after surgery. The catheter helps you empty your bladder (pass your urine). You may have drainage tubes coming out from under the dressing. These tubes attach to a device that removes blood or fluids that gather after surgery. Once you are awake, stable, and taking fluids well, you will be returned to your room. You will receive physical therapy as prescribed by your caregiver. If you do not have help at home, you may need to go to an extended care facility for a few weeks. If you are sent home with a continuous passive motion (CPM) machine, use it as instructed. Document Released: 12/18/2000 Document Revised: 08/31/2011 Document Reviewed: 07/14/2009 [facility] Patient Information 2012 [place], [place].Preparing  for Knee Replacement Recovery from knee replacement surgery can be made easier and more comfortable by taking steps to be prepared before surgery. This includes:  Arranging for others to help you.   Preparing your home.   Making sure your body is prepared by doing a pre-operative exam and being as healthy as you can.   Doing exercises before your surgery as told by your caregiver.  Also, you can ease any concerns about your financial responsibilities by calling your insurance company after you decide  to have surgery. In addition to your surgery and hospital stay, you will want to ask about your coverage for medical equipment, rehab facilities, and home care. ARRANGING FOR HELP   You will be getting stronger and more mobile every day. However, in the first couple weeks after surgery, it is unlikely you will be able to do all your daily activities as easily as before your surgery. You will tire easily and still have limited movement in your leg. Follow these guidelines to best arrange for the help you may need after your surgery:  Arrange for someone to drive you home from the hospital. Your surgeon will be able to tell you how many days you can expect to be in the hospital.   Cancel all work, care-giving, and volunteer responsibilities for at least 4 to 6 weeks after your surgery.   If you live alone, arrange for someone to care for your home and pets for the first 4 to 6 weeks.   Select someone with whom you feel comfortable to be with you day and night for the first week. This person will help you with your exercises and personal care, like bathing and using the toilet.   Arrange for drivers to bring you to and from your doctor and therapy appointments, as well as to the grocery store and other places you need to go, for at least 4 to 6 weeks.   Select 2 or 3 rehab facilities where you would be comfortable recovering just in case you are not able to go directly home to recover.  PREPARING YOUR HOME  Remove all clutter from your floors.   To see if you will be able to move in these spaces with a wheeled walker, hold your hands out about 6 inches from your sides. Then walk from your bed to the bathroom. Walk from your resting spot to your kitchen and bathroom. If you do not hit anything with your hands, you probably have enough room.   Remove throw rugs.   Move the items you use often to shelves and drawers that are at countertop height.  Move items in your bathroom, kitchen, and bedroom.     Prepare a few meals that you can freeze and reheat later.   Do not plan on recovering in bed.  It is better for your health to sit upright. You may wish to use a recliner with a small table nearby. Keep the items you use most frequently on that table. These may include the TV remote, a cordless phone, a book or laptop computer, water glass, and any other items of your choice.   Consider adding grab bars in the shower and near the toilet.   While you are in the hospital, you will learn about equipment helpful for your recovery. Some of the equipment includes raised toilet seats, tub benches, and shower benches. Often, your hospital care team will help you decide what you need and can direct you about where  to buy these items. You may not need all of these items, and they are not often returnable, so it is not recommended that you buy them before going to the hospital.  PREPARING YOUR BODY  Complete a pre-operative exam. This will ensure that your body is healthy enough to safely complete this surgery. Be certain to bring a complete list of all your medicines and supplements (herbs and vitamins). You may be advised to have additional tests to ensure your safety.   Complete elective dental care and routine cleanings before the surgery. Germs from anywhere in your body, including those in your mouth, can travel to your new joint and infect it.  It is important not to have any dental work performed for at least 3 months after your surgery. After surgery, be sure to tell your dentist about your joint replacement.   Maintain a healthy diet. Unless advised by your surgeon, do not drastically change your diet before your surgery.   Quit smoking. Get help from your caregiver if you need it.   The day before your surgery, follow your surgeon's directions for showering, eating and drinking. These directions are for your safety.  Marland Kitchen

## 2012-01-11 ENCOUNTER — Other Ambulatory Visit: Payer: Self-pay | Admitting: *Deleted

## 2012-01-11 ENCOUNTER — Telehealth: Payer: Self-pay | Admitting: Orthopedic Surgery

## 2012-01-11 NOTE — Telephone Encounter (Signed)
Contacted insurer [employer], ph# [phone], regarding in-patient surgery scheduled 01/22/12 at [facility]; CPT [phone], ICD9 codes 715.96, 715.16.  Per Meryl Crutch, advised to fax clinicals to fax# [phone], to attention of nurse reviewer.  Done. Included patient's Member ID# [id number], which is reference # until further response received from nurse.

## 2012-01-16 NOTE — Telephone Encounter (Signed)
01/16/12 Called back to [facility] to check on status of auth request faxed on 01/11/12. Left voice message at [phone] at care management pre-authorization department.

## 2012-01-17 ENCOUNTER — Encounter (HOSPITAL_COMMUNITY): Payer: Self-pay | Admitting: Pharmacy Technician

## 2012-01-17 ENCOUNTER — Encounter (HOSPITAL_COMMUNITY): Payer: Self-pay

## 2012-01-17 ENCOUNTER — Encounter (HOSPITAL_COMMUNITY)
Admission: RE | Admit: 2012-01-17 | Discharge: 2012-01-17 | Disposition: A | Discharge status: Home / Self Care | Payer: Medicare Other | Source: Ambulatory Visit | Attending: Orthopedic Surgery | Admitting: Orthopedic Surgery

## 2012-01-17 HISTORY — DX: Major depressive disorder, single episode, unspecified: F32.9

## 2012-01-17 HISTORY — DX: Sleep apnea, unspecified: G47.30

## 2012-01-17 HISTORY — DX: Anxiety disorder, unspecified: F41.9

## 2012-01-17 HISTORY — DX: Shortness of breath: R06.02

## 2012-01-17 HISTORY — DX: Depression, unspecified: F32.A

## 2012-01-17 HISTORY — DX: Diverticulosis of intestine, part unspecified, without perforation or abscess without bleeding: K57.90

## 2012-01-17 HISTORY — DX: Gastro-esophageal reflux disease without esophagitis: K21.9

## 2012-01-17 HISTORY — DX: Malignant (primary) neoplasm, unspecified: C80.1

## 2012-01-17 LAB — DIFFERENTIAL
Basophils Absolute: 0 10*3/uL (ref 0.0–0.1)
Basophils Relative: 0 % (ref 0–1)
Monocytes Relative: 11 % (ref 3–12)
Neutro Abs: 3.2 10*3/uL (ref 1.7–7.7)
Neutrophils Relative %: 51 % (ref 43–77)

## 2012-01-17 LAB — CBC
Hemoglobin: 12.8 g/dL (ref 12.0–15.0)
MCHC: 33.2 g/dL (ref 30.0–36.0)
Platelets: 251 10*3/uL (ref 150–400)
RDW: 12.6 % (ref 11.5–15.5)

## 2012-01-17 LAB — BASIC METABOLIC PANEL
BUN: 13 mg/dL (ref 6–23)
Calcium: 9.7 mg/dL (ref 8.4–10.5)
GFR calc Af Amer: >90 mL/min (ref >90)
GFR calc non Af Amer: 83 mL/min — ABNORMAL LOW (ref >90)
Potassium: 4.1 mEq/L (ref 3.5–5.1)

## 2012-01-17 LAB — ABO/RH: ABO/RH(D): A POS

## 2012-01-17 LAB — PROTIME-INR
INR: 0.98 (ref 0.00–1.49)
Prothrombin Time: 13.2 seconds (ref 11.6–15.2)

## 2012-01-17 LAB — PREPARE RBC (CROSSMATCH)

## 2012-01-17 NOTE — Telephone Encounter (Signed)
01/17/12 Received authorization, per Jonathon Bellows, nurse reviewer at [facility]:  Auth# [id number], for date of surgery 01/22/12 and "good for 7 days."

## 2012-01-17 NOTE — Patient Instructions (Signed)
20 [name]  01/17/2012   Your procedure is scheduled on: April 29  Report to [facility] at 0900 AM.  Call this number if you have problems the morning of surgery: [phone]   Remember:   Do not eat food:After Midnight.  May have clear liquids:until Midnight .  Clear liquids include soda, tea, black coffee, apple or grape juice, broth.  Take these medicines the morning of surgery with A SIP OF WATER: atenolol, ativan, meclizine, prilosec, zoloft, diovan   Do not wear jewelry, make-up or nail polish.  Do not wear lotions, powders, or perfumes. You may wear deodorant.  Do not shave 48 hours prior to surgery.  Do not bring valuables to the hospital.  Contacts, dentures or bridgework may not be worn into surgery.  Leave suitcase in the car. After surgery it may be brought to your room.  For patients admitted to the hospital, checkout time is 11:00 AM the day of discharge.   Patients discharged the day of surgery will not be allowed to drive home.  Name and phone number of your driver:  Special Instructions: CHG Shower Use Special Wash: 1/2 bottle night before surgery and 1/2 bottle morning of surgery.   Please read over the following fact sheets that you were given: Pain Booklet, Total Joint Packet, MRSA Information, Surgical Site Infection Prevention, Anesthesia Post-op Instructions and Care and Recovery After Surgery   PATIENT INSTRUCTIONS POST-ANESTHESIA  IMMEDIATELY FOLLOWING SURGERY:  Do not drive or operate machinery for the first twenty four hours after surgery.  Do not make any important decisions for twenty four hours after surgery or while taking narcotic pain medications or sedatives.  If you develop intractable nausea and vomiting or a severe headache please notify your doctor immediately.  FOLLOW-UP:  Please make an appointment with your surgeon as instructed. You do not need to follow up with anesthesia unless specifically instructed to do so.  WOUND CARE  INSTRUCTIONS (if applicable):  Keep a dry clean dressing on the anesthesia/puncture wound site if there is drainage.  Once the wound has quit draining you may leave it open to air.  Generally you should leave the bandage intact for twenty four hours unless there is drainage.  If the epidural site drains for more than 36-48 hours please call the anesthesia department.  QUESTIONS?:  Please feel free to call your physician or the hospital operator if you have any questions, and they will be happy to assist you.     [facility] Anesthesia Department [address] [place] [phone]    Total Knee Replacement In total knee replacement, the damaged knee is replaced with an artificial knee joint (prosthesis). The purpose of this surgery is to reduce pain and improve your range of motion. Regaining a near normal range of motion of your knee in the first 3 to 6 weeks after surgery is critical. Generally, these artificial joints last a minimum of 10 years. By that time, about 1 in 10 patients will need another surgery to repair the loose prosthesis. LET YOUR CAREGIVER KNOW ABOUT:   Allergies to food or medicine.   Medicines taken, including vitamins, herbs, eyedrops, over-the-counter medicines, and creams.   Use of steroids (by mouth or creams).   Previous problems with anesthetics or numbing medicines.   History of bleeding problems or blood clots.   Previous surgery.   Other health problems, including diabetes and kidney problems.   Possibility of pregnancy, if this applies.  RISKS AND  COMPLICATIONS   Knee pain.   Loss of range of motion of the knee (stiffness) or instability.   Loosening of the prosthesis.   Infection.  BEFORE THE PROCEDURE   If you smoke, quit.   You may need a replacement or addition of blood (transfusion) during this procedure. You may want to donate your own blood for storage several weeks before the procedure. This way, your own blood can be  stored and given to you if needed. Talk to your surgeon about this option.   Do not eat or drink anything for as long as directed by your caregiver before the procedure.   You may bathe and brush your teeth before the procedure. Do not swallow the water when brushing your teeth.   Scrub the area to be operated on for 5 minutes in the morning before the procedure. Use special soap if you are directed to do so by your caregiver.   Take your regular medicines with a small sip of water unless otherwise instructed. Your caregiver will let you know if there are medicines that need to be stopped and for how long.   You should be present 60 minutes before your procedure or as directed by your caregiver.  PROCEDURE  Before surgery, an intravenous (IV) access for giving fluids will be started. You will be given medicines and/or gas to make you sleep (anesthetic). You may be given medicines in your back with a needle to make you numb from the waist down. Your surgeon will take out any damaged cartilage and bone. He or she will then put in new metal, plastic, or ceramic joint surfaces to restore alignment and function to your knee. AFTER THE PROCEDURE  You will be taken to the recovery area where a nurse will watch and check your progress. You may have a long, narrow tube (catheter) in your bladder after surgery. The catheter helps you empty your bladder (pass your urine). You may have drainage tubes coming out from under the dressing. These tubes attach to a device that removes blood or fluids that gather after surgery. Once you are awake, stable, and taking fluids well, you will be returned to your room. You will receive physical therapy as prescribed by your caregiver. If you do not have help at home, you may need to go to an extended care facility for a few weeks. If you are sent home with a continuous passive motion (CPM) machine, use it as instructed. Document Released: 12/18/2000 Document Revised:  08/31/2011 Document Reviewed: 07/14/2009 [facility] Patient Information 2012 [place], [place].

## 2012-01-17 NOTE — Progress Notes (Signed)
01/17/12 1138  OBSTRUCTIVE SLEEP APNEA  Have you ever been diagnosed with sleep apnea through a sleep study? No  Do you snore loudly (loud enough to be heard through closed doors)?  0  Do you often feel tired, fatigued, or sleepy during the daytime? 1  Has anyone observed you stop breathing during your sleep? 0  Do you have, or are you being treated for high blood pressure? 1  BMI more than 35 kg/m2? 1  Age over 76 years old? 1  Neck circumference greater than 40 cm/18 inches? 0  Gender: 0  Obstructive Sleep Apnea Score 4   Score 4 or greater  Results sent to PCP

## 2012-01-20 NOTE — H&P (Signed)
  Chief Complaint   Patient presents with   .  Follow-up     Recheck left knee with xrays for preop.   The patient has pain in her LEFT knee, catching and. The pain has become unbearable for her. She's had progressive changes in her activities of daily living with difficulty squatting bending, kneeling, eating out of a chair. She is now ready for knee replacement. He understands the risks and benefits of the knee replacement after successfully undergoing RIGHT total knee arthroplasty.  Past Medical History   Diagnosis  Date   .  Arthritis    .  HTN (hypertension)    .  Thyroid dysfunction     Past Surgical History   Procedure  Date   .  Tubal ligation    .  Broken right ankle  1976   .  Broken radius ulna  1990   .  Thyroid surgery  1983   .  Rt foot bone spur removed  1992   .  Arthroscopic surgery rt knee  1997   .  Left knee  2004   .  Breast surgery  2001     LEFT/ CANCER   .  Joint replacement  right knee 2005 Carrieanne Kleen    History    Social History   .  Marital Status:  Widowed     Spouse Name:  N/A     Number of Children:  N/A   .  Years of Education:  N/A    Occupational History   .  Not on file.    Social History Main Topics   .  Smoking status:  Never Smoker   .  Smokeless tobacco:  Not on file   .  Alcohol Use:  No   .  Drug Use:  No   .  Sexually Active:  Not on file    Other Topics  Concern   .  Not on file    Social History Narrative   .  No narrative on file   Review of Systems  Constitutional: Negative.  HENT: Negative.  Eyes: Negative.  Respiratory: Negative.  Cardiovascular: Negative.  Gastrointestinal: Negative.  Genitourinary: Negative.  Musculoskeletal: Positive for back pain.  Skin: Negative.  Neurological: Negative.  Endo/Heme/Allergies: Negative.  Psychiatric/Behavioral: Negative.  Physical Exam GENERAL: normal development [place] normal. Hygiene, normal. Body habitus, endomorphic.  CDV: pulses are normal  Skin: normal  Lymph:  nodes were not palpable/normal  Psychiatric: awake, alert and oriented  Neuro: normal sensation   Upper extremity exam  Inspection and palpation revealed no abnormalities in the upper extremities. Range of motion is full without contracture.  Motor exam is normal with grade 5 strength.  The joints are fully reduced without subluxation.  There is no atrophy or tremor and muscle tone is normal. All joints are stable.   The LEFT knee flexes 95. She has valgus deformity. Lateral joint line tenderness. Small joint effusion. Strength and muscle tone are normal. Collateral ligaments are stable. No flexion contracture was detected.   RIGHT knee flexion, 105. Normal strength, stability, and appearance   X rays see report Valgus osteoarthritis, LEFT knee   Assessment: oa left knee  Plan: left tka

## 2012-01-22 ENCOUNTER — Encounter (HOSPITAL_COMMUNITY): Payer: Self-pay | Admitting: Anesthesiology

## 2012-01-22 ENCOUNTER — Encounter (HOSPITAL_COMMUNITY)
Admission: RE | Disposition: A | Discharge status: Skilled Nursing Facility | Payer: Self-pay | Source: Ambulatory Visit | Attending: Orthopedic Surgery

## 2012-01-22 ENCOUNTER — Encounter (HOSPITAL_COMMUNITY): Payer: Self-pay | Admitting: *Deleted

## 2012-01-22 ENCOUNTER — Inpatient Hospital Stay (HOSPITAL_COMMUNITY): Payer: Medicare Other

## 2012-01-22 ENCOUNTER — Inpatient Hospital Stay (HOSPITAL_COMMUNITY): Payer: Medicare Other | Admitting: Anesthesiology

## 2012-01-22 ENCOUNTER — Inpatient Hospital Stay (HOSPITAL_COMMUNITY)
Admission: RE | Admit: 2012-01-22 | Discharge: 2012-01-25 | DRG: 470 | Disposition: A | Discharge status: Skilled Nursing Facility | Payer: Medicare Other | Source: Ambulatory Visit | Attending: Orthopedic Surgery | Admitting: Orthopedic Surgery

## 2012-01-22 DIAGNOSIS — IMO0002 Reserved for concepts with insufficient information to code with codable children: Secondary | ICD-10-CM

## 2012-01-22 DIAGNOSIS — Z96659 Presence of unspecified artificial knee joint: Secondary | ICD-10-CM

## 2012-01-22 DIAGNOSIS — E079 Disorder of thyroid, unspecified: Secondary | ICD-10-CM | POA: Diagnosis present

## 2012-01-22 DIAGNOSIS — I10 Essential (primary) hypertension: Secondary | ICD-10-CM | POA: Diagnosis present

## 2012-01-22 DIAGNOSIS — M179 Osteoarthritis of knee, unspecified: Secondary | ICD-10-CM

## 2012-01-22 DIAGNOSIS — M1710 Unilateral primary osteoarthritis, unspecified knee: Principal | ICD-10-CM | POA: Diagnosis present

## 2012-01-22 HISTORY — PX: TOTAL KNEE ARTHROPLASTY: SHX125

## 2012-01-22 SURGERY — ARTHROPLASTY, KNEE, TOTAL
Anesthesia: Spinal | Site: Knee | Laterality: Left | Wound class: Clean

## 2012-01-22 MED ORDER — PANTOPRAZOLE SODIUM 40 MG PO TBEC
40.0000 mg | DELAYED_RELEASE_TABLET | Freq: Every day | ORAL | Status: DC
  Administered 2012-01-22 – 2012-01-25 (×4): 40 mg via ORAL
  Filled 2012-01-22 (×4): qty 1

## 2012-01-22 MED ORDER — BUPIVACAINE-EPINEPHRINE PF 0.5-1:200000 % IJ SOLN
INTRAMUSCULAR | Status: AC
  Filled 2012-01-22: qty 20

## 2012-01-22 MED ORDER — LORATADINE 10 MG PO TABS
10.0000 mg | ORAL_TABLET | Freq: Every day | ORAL | Status: DC
  Administered 2012-01-22 – 2012-01-25 (×4): 10 mg via ORAL
  Filled 2012-01-22 (×4): qty 1

## 2012-01-22 MED ORDER — METHOCARBAMOL 100 MG/ML IJ SOLN
500.0000 mg | Freq: Four times a day (QID) | INTRAVENOUS | Status: DC
  Administered 2012-01-22 – 2012-01-25 (×11): 500 mg via INTRAVENOUS
  Filled 2012-01-22 (×16): qty 5

## 2012-01-22 MED ORDER — SODIUM CHLORIDE 0.9 % IV SOLN
INTRAVENOUS | Status: DC
  Administered 2012-01-22 – 2012-01-23 (×4): via INTRAVENOUS

## 2012-01-22 MED ORDER — FENTANYL CITRATE 0.05 MG/ML IJ SOLN: 25.0000 ug | INTRAMUSCULAR | Status: DC | PRN

## 2012-01-22 MED ORDER — BUPIVACAINE HCL (PF) 0.25 % IJ SOLN
INTRAMUSCULAR | Status: AC
  Filled 2012-01-22: qty 60

## 2012-01-22 MED ORDER — ACETAMINOPHEN 650 MG RE SUPP: 650.0000 mg | Freq: Four times a day (QID) | RECTAL | Status: DC | PRN

## 2012-01-22 MED ORDER — ACETAMINOPHEN 10 MG/ML IV SOLN
1000.0000 mg | Freq: Once | INTRAVENOUS | Status: AC
  Administered 2012-01-22: 1000 mg via INTRAVENOUS

## 2012-01-22 MED ORDER — ONDANSETRON HCL 4 MG/2ML IJ SOLN: 4.0000 mg | Freq: Four times a day (QID) | INTRAMUSCULAR | Status: DC | PRN

## 2012-01-22 MED ORDER — METOCLOPRAMIDE HCL 10 MG PO TABS: 5.0000 mg | ORAL_TABLET | Freq: Three times a day (TID) | ORAL | Status: DC | PRN

## 2012-01-22 MED ORDER — FENTANYL CITRATE 0.05 MG/ML IJ SOLN
INTRAMUSCULAR | Status: AC
  Filled 2012-01-22: qty 2

## 2012-01-22 MED ORDER — METHOCARBAMOL 100 MG/ML IJ SOLN
500.0000 mg | Freq: Once | INTRAMUSCULAR | Status: DC
  Administered 2012-01-22: 500 mg via INTRAVENOUS
  Filled 2012-01-22: qty 5

## 2012-01-22 MED ORDER — TRAMADOL HCL 50 MG PO TABS
50.0000 mg | ORAL_TABLET | Freq: Four times a day (QID) | ORAL | Status: DC
  Administered 2012-01-22 – 2012-01-25 (×6): 50 mg via ORAL
  Filled 2012-01-22 (×2): qty 1

## 2012-01-22 MED ORDER — VALSARTAN-HYDROCHLOROTHIAZIDE 160-12.5 MG PO TABS: 1.0000 | ORAL_TABLET | Freq: Every morning | ORAL | Status: DC

## 2012-01-22 MED ORDER — PHENOL 1.4 % MT LIQD: 1.0000 | OROMUCOSAL | Status: DC | PRN

## 2012-01-22 MED ORDER — SERTRALINE HCL 50 MG PO TABS
100.0000 mg | ORAL_TABLET | Freq: Every morning | ORAL | Status: DC
  Administered 2012-01-23 – 2012-01-25 (×3): 100 mg via ORAL
  Filled 2012-01-22: qty 2
  Filled 2012-01-22: qty 1
  Filled 2012-01-22: qty 2

## 2012-01-22 MED ORDER — IRBESARTAN 150 MG PO TABS
150.0000 mg | ORAL_TABLET | Freq: Every day | ORAL | Status: DC
  Administered 2012-01-23 – 2012-01-25 (×3): 150 mg via ORAL
  Filled 2012-01-22 (×3): qty 1

## 2012-01-22 MED ORDER — MIDAZOLAM HCL 2 MG/2ML IJ SOLN
INTRAMUSCULAR | Status: AC
  Filled 2012-01-22: qty 2

## 2012-01-22 MED ORDER — ONDANSETRON HCL 4 MG/2ML IJ SOLN: 4.0000 mg | Freq: Once | INTRAMUSCULAR | Status: DC | PRN

## 2012-01-22 MED ORDER — DIPHENHYDRAMINE HCL 12.5 MG/5ML PO ELIX: 12.5000 mg | ORAL_SOLUTION | ORAL | Status: DC | PRN

## 2012-01-22 MED ORDER — HYDROCHLOROTHIAZIDE 12.5 MG PO CAPS
12.5000 mg | ORAL_CAPSULE | Freq: Every day | ORAL | Status: DC
  Administered 2012-01-23 – 2012-01-25 (×3): 12.5 mg via ORAL
  Filled 2012-01-22 (×3): qty 1

## 2012-01-22 MED ORDER — PROPOFOL 10 MG/ML IV BOLUS
INTRAVENOUS | Status: DC | PRN
  Administered 2012-01-22 (×2): 10 mg via INTRAVENOUS
  Administered 2012-01-22 (×2): 20 mg via INTRAVENOUS
  Administered 2012-01-22: 10 mg via INTRAVENOUS

## 2012-01-22 MED ORDER — EPHEDRINE SULFATE 50 MG/ML IJ SOLN
INTRAMUSCULAR | Status: DC | PRN
  Administered 2012-01-22 (×3): 10 mg via INTRAVENOUS

## 2012-01-22 MED ORDER — VITAMIN D 1000 UNITS PO TABS
2000.0000 [IU] | ORAL_TABLET | Freq: Every morning | ORAL | Status: DC
  Administered 2012-01-23 – 2012-01-24 (×2): 2000 [IU] via ORAL
  Filled 2012-01-22: qty 1
  Filled 2012-01-22 (×2): qty 2

## 2012-01-22 MED ORDER — MENTHOL 3 MG MT LOZG: 1.0000 | LOZENGE | OROMUCOSAL | Status: DC | PRN

## 2012-01-22 MED ORDER — LACTATED RINGERS IV SOLN
INTRAVENOUS | Status: DC
  Administered 2012-01-22: 1000 mL via INTRAVENOUS
  Administered 2012-01-22: 12:00:00 via INTRAVENOUS

## 2012-01-22 MED ORDER — DOCUSATE SODIUM 100 MG PO CAPS
100.0000 mg | ORAL_CAPSULE | Freq: Two times a day (BID) | ORAL | Status: DC
  Administered 2012-01-22 – 2012-01-25 (×7): 100 mg via ORAL
  Filled 2012-01-22 (×7): qty 1

## 2012-01-22 MED ORDER — ONDANSETRON HCL 4 MG/2ML IJ SOLN
INTRAMUSCULAR | Status: AC
  Administered 2012-01-22: 4 mg via INTRAVENOUS
  Filled 2012-01-22: qty 2

## 2012-01-22 MED ORDER — BUPIVACAINE ON-Q PAIN PUMP (FOR ORDER SET NO CHG)
INJECTION | Status: DC
  Filled 2012-01-22: qty 1

## 2012-01-22 MED ORDER — MECLIZINE HCL 12.5 MG PO TABS
12.5000 mg | ORAL_TABLET | Freq: Every morning | ORAL | Status: DC
  Administered 2012-01-23 – 2012-01-25 (×3): 12.5 mg via ORAL
  Filled 2012-01-22 (×3): qty 1

## 2012-01-22 MED ORDER — TRAMADOL HCL 50 MG PO TABS
ORAL_TABLET | ORAL | Status: AC
  Administered 2012-01-22: 50 mg via ORAL
  Filled 2012-01-22: qty 1

## 2012-01-22 MED ORDER — TRAMADOL HCL 50 MG PO TABS
50.0000 mg | ORAL_TABLET | Freq: Four times a day (QID) | ORAL | Status: DC
  Administered 2012-01-22 – 2012-01-24 (×8): 50 mg via ORAL
  Filled 2012-01-22 (×9): qty 1

## 2012-01-22 MED ORDER — POTASSIUM CHLORIDE CRYS ER 20 MEQ PO TBCR
20.0000 meq | EXTENDED_RELEASE_TABLET | Freq: Every morning | ORAL | Status: DC
Start: 2012-01-23 — End: 2012-01-23

## 2012-01-22 MED ORDER — MIDAZOLAM HCL 2 MG/2ML IJ SOLN
1.0000 mg | INTRAMUSCULAR | Status: DC | PRN
  Administered 2012-01-22: 2 mg via INTRAVENOUS

## 2012-01-22 MED ORDER — CHLORHEXIDINE GLUCONATE 4 % EX LIQD
60.0000 mL | Freq: Once | CUTANEOUS | Status: DC
  Filled 2012-01-22: qty 60

## 2012-01-22 MED ORDER — SENNOSIDES-DOCUSATE SODIUM 8.6-50 MG PO TABS
1.0000 | ORAL_TABLET | Freq: Every evening | ORAL | Status: DC | PRN
  Administered 2012-01-23: 1 via ORAL
  Filled 2012-01-22: qty 1

## 2012-01-22 MED ORDER — SODIUM CHLORIDE 0.9 % IR SOLN
Status: DC | PRN
  Administered 2012-01-22: 1000 mL

## 2012-01-22 MED ORDER — CEFAZOLIN SODIUM-DEXTROSE 2-3 GM-% IV SOLR
2.0000 g | Freq: Four times a day (QID) | INTRAVENOUS | Status: AC
  Administered 2012-01-22 – 2012-01-23 (×3): 2 g via INTRAVENOUS
  Filled 2012-01-22 (×3): qty 50

## 2012-01-22 MED ORDER — BISACODYL 5 MG PO TBEC: 5.0000 mg | DELAYED_RELEASE_TABLET | Freq: Every day | ORAL | Status: DC | PRN

## 2012-01-22 MED ORDER — ACETAMINOPHEN 10 MG/ML IV SOLN
1000.0000 mg | Freq: Four times a day (QID) | INTRAVENOUS | Status: AC
  Administered 2012-01-22 – 2012-01-23 (×3): 1000 mg via INTRAVENOUS
  Filled 2012-01-22 (×3): qty 100

## 2012-01-22 MED ORDER — ONDANSETRON HCL 4 MG PO TABS: 4.0000 mg | ORAL_TABLET | Freq: Four times a day (QID) | ORAL | Status: DC | PRN

## 2012-01-22 MED ORDER — FENTANYL CITRATE 0.05 MG/ML IJ SOLN
INTRAMUSCULAR | Status: DC | PRN
  Administered 2012-01-22: 50 ug via INTRAVENOUS

## 2012-01-22 MED ORDER — PROPOFOL 10 MG/ML IV EMUL
INTRAVENOUS | Status: AC
  Filled 2012-01-22: qty 20

## 2012-01-22 MED ORDER — SENNA 8.6 MG PO TABS
1.0000 | ORAL_TABLET | Freq: Two times a day (BID) | ORAL | Status: DC
  Administered 2012-01-22 – 2012-01-25 (×6): 8.6 mg via ORAL
  Filled 2012-01-22 (×6): qty 1

## 2012-01-22 MED ORDER — EPHEDRINE SULFATE 50 MG/ML IJ SOLN
INTRAMUSCULAR | Status: AC
  Filled 2012-01-22: qty 1

## 2012-01-22 MED ORDER — LIDOCAINE HCL (PF) 1 % IJ SOLN
INTRAMUSCULAR | Status: AC
  Filled 2012-01-22: qty 5

## 2012-01-22 MED ORDER — CEFAZOLIN SODIUM-DEXTROSE 2-3 GM-% IV SOLR
INTRAVENOUS | Status: AC
  Filled 2012-01-22: qty 50

## 2012-01-22 MED ORDER — CELECOXIB 100 MG PO CAPS
200.0000 mg | ORAL_CAPSULE | Freq: Every day | ORAL | Status: DC
  Administered 2012-01-23 – 2012-01-25 (×3): 200 mg via ORAL
  Filled 2012-01-22 (×4): qty 2

## 2012-01-22 MED ORDER — MIDAZOLAM HCL 5 MG/5ML IJ SOLN
INTRAMUSCULAR | Status: DC | PRN
  Administered 2012-01-22 (×2): 2 mg via INTRAVENOUS

## 2012-01-22 MED ORDER — CEFAZOLIN SODIUM-DEXTROSE 2-3 GM-% IV SOLR: 2.0000 g | INTRAVENOUS | Status: DC

## 2012-01-22 MED ORDER — BUPIVACAINE HCL (PF) 0.25 % IJ SOLN
INTRAMUSCULAR | Status: AC
  Filled 2012-01-22: qty 210

## 2012-01-22 MED ORDER — BUPIVACAINE IN DEXTROSE 0.75-8.25 % IT SOLN
INTRATHECAL | Status: AC
  Filled 2012-01-22: qty 2

## 2012-01-22 MED ORDER — ACETAMINOPHEN 10 MG/ML IV SOLN
INTRAVENOUS | Status: AC
  Filled 2012-01-22: qty 100

## 2012-01-22 MED ORDER — SODIUM CHLORIDE 0.9 % IR SOLN
Status: DC | PRN
  Administered 2012-01-22: 3000 mL

## 2012-01-22 MED ORDER — MIDAZOLAM HCL 2 MG/2ML IJ SOLN
INTRAMUSCULAR | Status: AC
  Administered 2012-01-22: 2 mg via INTRAVENOUS
  Filled 2012-01-22: qty 2

## 2012-01-22 MED ORDER — ATENOLOL 25 MG PO TABS
50.0000 mg | ORAL_TABLET | Freq: Every evening | ORAL | Status: DC
  Administered 2012-01-22 – 2012-01-24 (×3): 50 mg via ORAL
  Filled 2012-01-22: qty 2
  Filled 2012-01-22 (×3): qty 1

## 2012-01-22 MED ORDER — ADULT MULTIVITAMIN W/MINERALS CH
1.0000 | ORAL_TABLET | Freq: Every morning | ORAL | Status: DC
  Administered 2012-01-23 – 2012-01-25 (×3): 1 via ORAL
  Filled 2012-01-22 (×3): qty 1

## 2012-01-22 MED ORDER — CELECOXIB 100 MG PO CAPS
400.0000 mg | ORAL_CAPSULE | Freq: Once | ORAL | Status: AC
  Administered 2012-01-22: 400 mg via ORAL

## 2012-01-22 MED ORDER — LORAZEPAM 0.5 MG PO TABS: 0.5000 mg | ORAL_TABLET | Freq: Three times a day (TID) | ORAL | Status: DC | PRN

## 2012-01-22 MED ORDER — PROPOFOL 10 MG/ML IV EMUL
INTRAVENOUS | Status: DC | PRN
  Administered 2012-01-22: 20 ug/kg/min via INTRAVENOUS
  Administered 2012-01-22: 11:00:00 via INTRAVENOUS

## 2012-01-22 MED ORDER — CELECOXIB 100 MG PO CAPS
ORAL_CAPSULE | ORAL | Status: AC
  Administered 2012-01-22: 400 mg via ORAL
  Filled 2012-01-22: qty 4

## 2012-01-22 MED ORDER — ACETAMINOPHEN 325 MG PO TABS: 650.0000 mg | ORAL_TABLET | Freq: Four times a day (QID) | ORAL | Status: DC | PRN

## 2012-01-22 MED ORDER — BUPIVACAINE-EPINEPHRINE (PF) 0.5% -1:200000 IJ SOLN
INTRAMUSCULAR | Status: DC | PRN
  Administered 2012-01-22: 60 mL

## 2012-01-22 MED ORDER — OCUVITE PO TABS
1.0000 | ORAL_TABLET | Freq: Every morning | ORAL | Status: DC
  Administered 2012-01-23 – 2012-01-25 (×3): 1 via ORAL
  Filled 2012-01-22 (×4): qty 1

## 2012-01-22 MED ORDER — MAGNESIUM CITRATE PO SOLN: 1.0000 | Freq: Once | ORAL | Status: AC | PRN

## 2012-01-22 MED ORDER — METHOCARBAMOL 500 MG PO TABS
ORAL_TABLET | ORAL | Status: AC
  Filled 2012-01-22: qty 1

## 2012-01-22 MED ORDER — BUPIVACAINE 0.25 % ON-Q PUMP SINGLE CATH 300ML
INJECTION | Status: DC | PRN
  Administered 2012-01-22: 270 mL

## 2012-01-22 MED ORDER — ALUM & MAG HYDROXIDE-SIMETH 200-200-20 MG/5ML PO SUSP: 30.0000 mL | ORAL | Status: DC | PRN

## 2012-01-22 MED ORDER — CEFAZOLIN SODIUM 1-5 GM-% IV SOLN
INTRAVENOUS | Status: DC | PRN
  Administered 2012-01-22: 2 g via INTRAVENOUS

## 2012-01-22 MED ORDER — ONDANSETRON HCL 4 MG/2ML IJ SOLN
4.0000 mg | Freq: Once | INTRAMUSCULAR | Status: AC
  Administered 2012-01-22: 4 mg via INTRAVENOUS

## 2012-01-22 MED ORDER — METOCLOPRAMIDE HCL 5 MG/ML IJ SOLN: 5.0000 mg | Freq: Three times a day (TID) | INTRAMUSCULAR | Status: DC | PRN

## 2012-01-22 SURGICAL SUPPLY — 71 items
BAG HAMPER (MISCELLANEOUS) ×2 IMPLANT
BANDAGE ELASTIC 4 VELCRO NS (GAUZE/BANDAGES/DRESSINGS) ×2 IMPLANT
BANDAGE ELASTIC 6 VELCRO NS (GAUZE/BANDAGES/DRESSINGS) ×4 IMPLANT
BANDAGE ESMARK 6X9 LF (GAUZE/BANDAGES/DRESSINGS) ×1 IMPLANT
BIT DRILL 3.2X128 (BIT) IMPLANT
BLADE HEX COATED 2.75 (ELECTRODE) ×2 IMPLANT
BLADE SAG 18X100X1.27 (BLADE) ×2 IMPLANT
BLADE SAGITTAL 25.0X1.27X90 (BLADE) ×2 IMPLANT
BLADE SAW SAG 90X13X1.27 (BLADE) ×2 IMPLANT
BNDG ESMARK 6X9 LF (GAUZE/BANDAGES/DRESSINGS) ×2
BOWL SMART MIX CTS (DISPOSABLE) IMPLANT
CATH KIT ON Q 2.5IN SLV (PAIN MANAGEMENT) ×2 IMPLANT
CEMENT HV SMART SET (Cement) ×4 IMPLANT
CLOTH BEACON ORANGE TIMEOUT ST (SAFETY) ×2 IMPLANT
COOLER CRYO CUFF IC AND MOTOR (MISCELLANEOUS) IMPLANT
COVER PROBE W GEL 5X96 (DRAPES) ×2 IMPLANT
COVER SURGICAL LIGHT HANDLE (MISCELLANEOUS) ×4 IMPLANT
CUFF CRYO KNEE LG 20X31 COOLER (ORTHOPEDIC SUPPLIES) IMPLANT
CUFF CRYO KNEE18X23 MED (MISCELLANEOUS) IMPLANT
CUFF TOURNIQUET SINGLE 34IN LL (TOURNIQUET CUFF) IMPLANT
CUFF TOURNIQUET SINGLE 44IN (TOURNIQUET CUFF) ×2 IMPLANT
DECANTER SPIKE VIAL GLASS SM (MISCELLANEOUS) ×22 IMPLANT
DRAIN TROCAR  MED 1/8 (DRAIN) ×2 IMPLANT
DRAPE BACK TABLE (DRAPES) ×2 IMPLANT
DRAPE EXTREMITY T 121X128X90 (DRAPE) ×2 IMPLANT
DRAPE U-SHAPE 47X51 STRL (DRAPES) ×2 IMPLANT
DRSG MEPILEX BORDER 4X12 (GAUZE/BANDAGES/DRESSINGS) ×2 IMPLANT
DURAPREP 26ML APPLICATOR (WOUND CARE) ×4 IMPLANT
ELECT REM PT RETURN 9FT ADLT (ELECTROSURGICAL) ×2
ELECTRODE REM PT RTRN 9FT ADLT (ELECTROSURGICAL) ×1 IMPLANT
FACESHIELD LNG OPTICON STERILE (SAFETY) ×2 IMPLANT
GLOVE ECLIPSE 6.5 STRL STRAW (GLOVE) ×4 IMPLANT
GLOVE INDICATOR 7.0 STRL GRN (GLOVE) ×8 IMPLANT
GLOVE INDICATOR 8.0 STRL GRN (GLOVE) ×2 IMPLANT
GLOVE OPTIFIT SS 8.0 STRL (GLOVE) ×2 IMPLANT
GLOVE SKINSENSE NS SZ8.0 LF (GLOVE) ×2
GLOVE SKINSENSE STRL SZ8.0 LF (GLOVE) ×2 IMPLANT
GLOVE SS BIOGEL STRL SZ 8 (GLOVE) ×2 IMPLANT
GLOVE SS N UNI LF 8.5 STRL (GLOVE) ×2 IMPLANT
GLOVE SUPERSENSE BIOGEL SZ 8 (GLOVE) ×2
GOWN STRL REIN XL XLG (GOWN DISPOSABLE) ×8 IMPLANT
HANDPIECE INTERPULSE COAX TIP (DISPOSABLE) ×1
HOOD W/PEELAWAY (MISCELLANEOUS) ×8 IMPLANT
INST SET MAJOR BONE (KITS) ×2 IMPLANT
IV NS IRRIG 3000ML ARTHROMATIC (IV SOLUTION) ×2 IMPLANT
KIT BLADEGUARD II DBL (SET/KITS/TRAYS/PACK) ×2 IMPLANT
KIT ROOM TURNOVER APOR (KITS) ×2 IMPLANT
MANIFOLD NEPTUNE II (INSTRUMENTS) ×2 IMPLANT
MARKER SKIN DUAL TIP RULER LAB (MISCELLANEOUS) ×2 IMPLANT
NEEDLE HYPO 21X1.5 SAFETY (NEEDLE) ×2 IMPLANT
NS IRRIG 1000ML POUR BTL (IV SOLUTION) ×2 IMPLANT
PACK TOTAL JOINT (CUSTOM PROCEDURE TRAY) ×2 IMPLANT
PAD ARMBOARD 7.5X6 YLW CONV (MISCELLANEOUS) ×2 IMPLANT
PAD DANNIFLEX CPM (ORTHOPEDIC SUPPLIES) ×2 IMPLANT
PIN TROCAR 3 INCH (PIN) ×2 IMPLANT
PUMP ON Q 270MLX5ML/HR (PAIN MANAGEMENT) ×2 IMPLANT
SET BASIN LINEN APH (SET/KITS/TRAYS/PACK) ×2 IMPLANT
SET HNDPC FAN SPRY TIP SCT (DISPOSABLE) ×1 IMPLANT
SPONGE GAUZE 4X4 12PLY (GAUZE/BANDAGES/DRESSINGS) IMPLANT
SPONGE LAP 18X18 X RAY DECT (DISPOSABLE) ×2 IMPLANT
STAPLER VISISTAT 35W (STAPLE) ×2 IMPLANT
SUT BRALON NAB BRD #1 30IN (SUTURE) ×4 IMPLANT
SUT MON AB 0 CT1 (SUTURE) ×4 IMPLANT
SUT MON AB 2-0 CT1 36 (SUTURE) IMPLANT
SYR 30ML LL (SYRINGE) ×2 IMPLANT
SYR BULB IRRIGATION 50ML (SYRINGE) ×2 IMPLANT
TOWEL OR 17X26 4PK STRL BLUE (TOWEL DISPOSABLE) ×2 IMPLANT
TOWER CARTRIDGE SMART MIX (DISPOSABLE) ×2 IMPLANT
TRAY FOLEY CATH 14FR (SET/KITS/TRAYS/PACK) ×2 IMPLANT
WATER STERILE IRR 1000ML POUR (IV SOLUTION) ×8 IMPLANT
YANKAUER SUCT 12FT TUBE ARGYLE (SUCTIONS) ×2 IMPLANT

## 2012-01-22 NOTE — Anesthesia Postprocedure Evaluation (Addendum)
  Anesthesia Post-op Note  Patient: [name]  Procedure(s) Performed: Procedure(s) (LRB): TOTAL KNEE ARTHROPLASTY (Left)  Patient Location: PACU  Anesthesia Type: Spinal  Level of Consciousness: awake, alert  and oriented  Airway and Oxygen Therapy: Patient Spontanous Breathing and Patient connected to nasal cannula oxygen  Post-op Pain: none  Post-op Assessment: Post-op Vital signs reviewed, Patient's Cardiovascular Status Stable, Respiratory Function Stable, Patent Airway and No signs of Nausea or vomiting  Post-op Vital Signs: Reviewed and stable  Complications: No apparent anesthesia complications  01/23/12  Patient doing well.  VSS  No apparent anesthesia complications.

## 2012-01-22 NOTE — Progress Notes (Signed)
CPM placed on left leg with settings at 0-60.

## 2012-01-22 NOTE — Progress Notes (Signed)
CPM removed from pt after seeing Dr Romeo Apple. To be started in am.

## 2012-01-22 NOTE — Brief Op Note (Signed)
01/22/2012  12:53 PM  PATIENT:  [name]  78 y.o. female  PRE-OPERATIVE DIAGNOSIS:  osteoarthritis left knee  POST-OPERATIVE DIAGNOSIS:  osteoarthritis left knee  PROCEDURE:  Procedure(s) (LRB): TOTAL KNEE ARTHROPLASTY (Left)  SURGEON:  Surgeon(s) and Role:    * Vickki Hearing, MD - Primary  PHYSICIAN ASSISTANT:   ASSISTANTS: wayne mcfatter and betty ashley    ANESTHESIA:   spinal  EBL:  Total I/O In: 1000 [I.V.:1000] Out: 280 [Urine:230; Blood:50]  BLOOD ADMINISTERED:none  DRAINS: (1) Hemovact drain(s) in the joint with  Suction Open   LOCAL MEDICATIONS USED:  MARCAINE   , Amount: 60 ml and OTHER with epi  SPECIMEN:  No Specimen  DISPOSITION OF SPECIMEN:  N/A  COUNTS:  YES  TOURNIQUET:   Total Tourniquet Time Documented: Thigh (Left) - 95 minutes  DICTATION: .Reubin Milan Dictation  PLAN OF CARE: Admit to inpatient   PATIENT DISPOSITION:  PACU - hemodynamically stable.   Delay start of Pharmacological VTE agent (>24hrs) due to surgical blood loss or risk of bleeding: yes

## 2012-01-22 NOTE — Interval H&P Note (Signed)
History and Physical Interval Note:  01/22/2012 9:57 AM  [name]  has presented today for surgery, with the diagnosis of osteoarthritis left knee  The various methods of treatment have been discussed with the patient and family. After consideration of risks, benefits and other options for treatment, the patient has consented to  Procedure(s) (LRB): TOTAL KNEE ARTHROPLASTY (Left) as a surgical intervention .  The patients' history has been reviewed, patient examined, no change in status, stable for surgery.  I have reviewed the patients' chart and labs.  Questions were answered to the patient's satisfaction.     Fuller Canada

## 2012-01-22 NOTE — Anesthesia Preprocedure Evaluation (Signed)
Anesthesia Evaluation  Patient identified by MRN, date of birth, ID band Patient awake    Reviewed: Allergy & Precautions, H&P , NPO status , Patient's Chart, lab work & pertinent test results  Airway Mallampati: III      Dental  (+) Edentulous Upper and Edentulous Lower   Pulmonary shortness of breath and with exertion, asthma , sleep apnea ,  breath sounds clear to auscultation        Cardiovascular hypertension, Rhythm:Regular Rate:Normal     Neuro/Psych PSYCHIATRIC DISORDERS Anxiety Depression  Neuromuscular disease    GI/Hepatic GERD-  Medicated and Controlled,  Endo/Other  Hypothyroidism Morbid obesity  Renal/GU      Musculoskeletal   Abdominal   Peds  Hematology   Anesthesia Other Findings   Reproductive/Obstetrics                           Anesthesia Physical Anesthesia Plan  ASA: III  Anesthesia Plan: Spinal   Post-op Pain Management:    Induction:   Airway Management Planned: Nasal Cannula  Additional Equipment:   Intra-op Plan:   Post-operative Plan:   Informed Consent: I have reviewed the patients History and Physical, chart, labs and discussed the procedure including the risks, benefits and alternatives for the proposed anesthesia with the patient or authorized representative who has indicated his/her understanding and acceptance.     Plan Discussed with:   Anesthesia Plan Comments:         Anesthesia Quick Evaluation

## 2012-01-22 NOTE — Progress Notes (Signed)
Both heels off bed using eggcrate strips.  Foot pump applied bilateraly.

## 2012-01-22 NOTE — Op Note (Signed)
Preop diagnosis osteoarthritis left knee Postop diagnosis same Procedure left total knee arthroplasty Surgeon Romeo Apple Assisted by wayne mcfattter and betty ashley  Anesthesia spinal Findings valgus moderate oa severe all 3 compartments involved  Tourniquet time  90 minutes, pressure 300 mm of mercury  Indications for procedure disabling knee pain, failure to control pain with nonoperative measures  Details of procedure:  In the preop area the patient's left  knee was marked and countersigned by the surgeon, the chart was updated, consent was signed  The patient was taken to the operating room for spinal anesthetic followed by administering 2 g of Ancef based on weight of >80 kg  A Foley catheter was inserted sterilely, then the operative extremity  was prepped and draped sterilely  The timeout was completed  The limb was then exsanguinated with a six-inch Esmarch with the knee in flexion and the tourniquet was elevated to 300 mm of mercury. A midline incision was made, the subcutaneous tissues were divided down to the extensor mechanism. A medial arthrotomy was performed, the patella was everted,  the fat pad was resected. The medial and  lateral menisci were resected. The medial soft tissue sleeve was elevated to the mid coronal plane. The anterior cruciate ligament and PCL were resected. Osteophytes were removed. The distal femur anterior surface was skeletonized with sharp dissection.  A three-eighths inch drill bit was used to enter the femoral canal which was suctioned and irrigated until the fluid was clear;  an intramedullary rod was placed in the femur with a 5 , left , 10mm  setting, the block was pinned in place; then a 10mm distal femoral resection was performed. The cut was checked for flatness.  The sizing guide was then placed on the femur, the femur measured a size 2; the block was pinned in external rotation 3 using the epicondyles as reference; a  4-in-1 cutting block was  placed and the 4 cuts were made with retractors protecting the collateral ligaments. The posterior osteophytes were removed with a curved osteotome. Residual PCL tissue was resected; residual meniscal tissue was resected.  The external tibial alignment instrument was set for tibial resection. The guide was   placed referencing the medial side which was the worn side and the stylus was set at 2 mm resection. Anterior slope was built in to match the patients anatomy; a neutral varus valgus cut was set using the medial third of the tibial tubercle as reference along with the medial portion of the lateral tibial spine. Theguide was stabilized with pins.  A saw was used to resect the anterior tibia. The tibia was sized to a size 2.5 base plate.  We then placed spacer blocks using a  10 and a 12.5 spacer block, which did not fit ;  the knee was balanced  in extension and was balanced in flexion with the 10 spacer block   The box cut was then done using the box cutting guide. We then turned our attention to the patella.  The patella measured 23 mm we set the guide to leave 16 mm of patella.  After resection of the patella,  It was remeasured, and measured 15 mm. The size was 32. We then drilled the 3 peg holes.  We then did a trial reduction. The trial reduction was excellent with full extension, balanced in extension, balance in flexion. Passive flexion equalled 110, patella normal tracking.   We then punched the tibia per technique.   The bone was then irrigated  and dried while cement was mixed on the back table. The implants were checked for accuracy and then cemented in place; excess cement was removed; the cement was allowed to cure. The wound was then irrigated with copious amounts of saline, the posterior capsule was injected with 30 cc of Marcaine with epinephrine followed by 30 cc in the soft tissue. The 10mm insert was placed. Range of motion matched trial reduction  The capsule was closed with #1  Bralon in interrupted and running fashion and then the joint was injected with 30 cc of Marcaine with epinephrine.  The subcutaneous pain pump was placed.  The subcutaneous tissues were closed with  0 Monocryl and 2-0 Monocryl in running fashion  Staples were used to reapproximate the skin edges.  Sterile dressings were applied. A radiograph was obtained. The patient was then taken to the recovery room in stable condition.  Routine postop plan for knee replacement.

## 2012-01-22 NOTE — Anesthesia Procedure Notes (Signed)
Spinal  Patient location during procedure: OR Start time: 01/22/2012 10:45 AM Staffing CRNA/Resident: Glynn Octave E Other anesthesia staff: Laurene Footman Preanesthetic Checklist Completed: patient identified, site marked, surgical consent, pre-op evaluation, timeout performed, IV checked, risks and benefits discussed and monitors and equipment checked Spinal Block Patient position: left lateral decubitus Prep: Betadine Patient monitoring: heart rate, cardiac monitor, continuous pulse ox and blood pressure Approach: left paramedian Location: L3-4 Injection technique: single-shot Needle Needle type: Spinocan  Needle gauge: 22 G Needle length: 9 cm Assessment Sensory level: T8 Additional Notes  ATTEMPTS:2 TRAY ZO:[id number] TRAY EXPIRATION DATE:07/2012 CRNA attempted SAB paramedian and midline approach without success.  Dr, Jayme Cloud called in, SAB placed with , 22G quincke.  Level T8

## 2012-01-22 NOTE — Transfer of Care (Signed)
Immediate Anesthesia Transfer of Care Note  Patient: [name]  Procedure(s) Performed: Procedure(s) (LRB): TOTAL KNEE ARTHROPLASTY (Left)  Patient Location: PACU  Anesthesia Type: Spinal  Level of Consciousness: awake, alert  and oriented  Airway & Oxygen Therapy: Patient Spontanous Breathing and Patient connected to nasal cannula oxygen  Post-op Assessment: Report given to PACU RN  Post vital signs: Reviewed and stable  Complications: No apparent anesthesia complications

## 2012-01-23 LAB — BASIC METABOLIC PANEL
CO2: 27 mEq/L (ref 19–32)
Chloride: 98 mEq/L (ref 96–112)
Creatinine, Ser: 0.55 mg/dL (ref 0.50–1.10)
Glucose, Bld: 135 mg/dL — ABNORMAL HIGH (ref 70–99)

## 2012-01-23 LAB — CBC
MCH: 32.2 pg (ref 26.0–34.0)
MCHC: 33.7 g/dL (ref 30.0–36.0)
MCV: 95.7 fL (ref 78.0–100.0)
Platelets: 227 10*3/uL (ref 150–400)

## 2012-01-23 MED ORDER — HYDROMORPHONE HCL PF 1 MG/ML IJ SOLN
0.5000 mg | INTRAMUSCULAR | Status: DC | PRN
  Administered 2012-01-23: 0.5 mg via INTRAVENOUS
  Filled 2012-01-23: qty 1

## 2012-01-23 MED ORDER — ASPIRIN 81 MG PO TBEC: 325.0000 mg | DELAYED_RELEASE_TABLET | Freq: Two times a day (BID) | ORAL | Status: DC

## 2012-01-23 MED ORDER — HYDROCODONE-ACETAMINOPHEN 5-325 MG PO TABS
1.0000 | ORAL_TABLET | ORAL | Status: DC
  Administered 2012-01-23 – 2012-01-25 (×11): 1 via ORAL
  Filled 2012-01-23 (×11): qty 1

## 2012-01-23 MED ORDER — POTASSIUM CHLORIDE CRYS ER 20 MEQ PO TBCR
40.0000 meq | EXTENDED_RELEASE_TABLET | Freq: Two times a day (BID) | ORAL | Status: DC
  Administered 2012-01-23 – 2012-01-25 (×5): 40 meq via ORAL
  Filled 2012-01-23 (×5): qty 2

## 2012-01-23 MED ORDER — ASPIRIN EC 325 MG PO TBEC
325.0000 mg | DELAYED_RELEASE_TABLET | Freq: Two times a day (BID) | ORAL | Status: DC
  Administered 2012-01-23 – 2012-01-25 (×5): 325 mg via ORAL
  Filled 2012-01-23 (×5): qty 1

## 2012-01-23 NOTE — Progress Notes (Signed)
Clinical Social Work Department CLINICAL SOCIAL WORK PLACEMENT NOTE 01/23/2012  Patient:  [name], [name]  Account Number:  [id number] Admit date:  01/22/2012  Clinical Social Worker:  Robin Searing  Date/time:  01/23/2012 11:46 AM  Clinical Social Work is seeking post-discharge placement for this patient at the following level of care:   SKILLED NURSING   (*CSW will update this form in Epic as items are completed)   01/23/2012  Patient/family provided with [facility] Department of Clinical Social Work's list of facilities offering this level of care within the geographic area requested by the patient (or if unable, by the patient's family).  01/23/2012  Patient/family informed of their freedom to choose among providers that offer the needed level of care, that participate in Medicare, Medicaid or managed care program needed by the patient, have an available bed and are willing to accept the patient.  01/23/2012  Patient/family informed of [facility]' ownership interest in [facility], as well as of the fact that they are under no obligation to receive care at this facility.  PASARR submitted to EDS on 01/23/2012 PASARR number received from EDS on   FL2 transmitted to all facilities in geographic area requested by pt/family on  01/23/2012 FL2 transmitted to all facilities within larger geographic area on   Patient informed that his/her managed care company has contracts with or will negotiate with  certain facilities, including the following:   [facility] Medicare-     Patient/family informed of bed offers received:   Patient chooses bed at  Physician recommends and patient chooses bed at    Patient to be transferred to  on   Patient to be transferred to facility by   The following physician request were entered in Epic:   Additional Comments: Will begin Insurance auth for SNF with PT eval completed-  Reece Levy, MSW, [place] [phone]

## 2012-01-23 NOTE — Progress Notes (Signed)
Utilization Review Complete  

## 2012-01-23 NOTE — Addendum Note (Signed)
Addendum  created 01/23/12 1626 by Moshe Salisbury, CRNA   Modules edited:Notes Section

## 2012-01-23 NOTE — Progress Notes (Signed)
Clinical Social Work Department BRIEF PSYCHOSOCIAL ASSESSMENT 01/23/2012  Patient:  [name], [name]     Account Number:  [id number]     Admit date:  01/22/2012  Clinical Social Worker:  Robin Searing  Date/Time:  01/23/2012 11:40 AM  Referred by:  Physician  Date Referred:  01/23/2012 Referred for  SNF Placement   Other Referral:   Interview type:  Patient Other interview type:   Daughter, Sedalia Muta- [phone], contacted via phone    PSYCHOSOCIAL DATA Living Status:  ALONE Admitted from facility:   Level of care:   Primary support name:  Diane Primary support relationship to patient:  FAMILY Degree of support available:   minimal    CURRENT CONCERNS Current Concerns  Post-Acute Placement   Other Concerns:    SOCIAL WORK ASSESSMENT / PLAN Patient requesting SNF at d/c- has been to [facility] in past. Discussed [facility] Medicare authorization and copay with her and daughter   Assessment/plan status:  Other - See comment Other assessment/ plan:   Will complete FL2 and Pasarr for SNF search   Information/referral to community resources:    PATIENT'S/FAMILY'S RESPONSE TO PLAN OF CARE: Daughter and patient agreeable to plans-     Reece Levy, MSW, [employer] [phone]

## 2012-01-23 NOTE — Progress Notes (Signed)
Subjective: 1 Day Post-Op Procedure(s) (LRB): TOTAL KNEE ARTHROPLASTY (Left) Patient reports pain as severe.    Objective: Vital signs in last 24 hours: Temp:  [97.3 F (36.3 C)-98.4 F (36.9 C)] 98.1 F (36.7 C) (04/30 0541) Pulse Rate:  [75-87] 79  (04/30 0541) Resp:  [11-22] 20  (04/30 0541) BP: (105-155)/(33-79) 119/69 mmHg (04/30 0541) SpO2:  [90 %-100 %] 90 % (04/30 0541) Weight:  [199 lb 15.8 oz (90.714 kg)] 199 lb 15.8 oz (90.714 kg) (04/29 1500)  Intake/Output from previous day: 04/29 0701 - 04/30 0700 In: 1250 [P.O.:50; I.V.:1100; IV Piggyback:100] Out: 2260 [Urine:1630; Drains:580; Blood:50] Intake/Output this shift:     Basename 01/23/12 0548  HGB 10.4*    Basename 01/23/12 0548  WBC 8.9  RBC 3.23*  HCT 30.9*  PLT 227    Basename 01/23/12 0548  NA 134*  K 3.3*  CL 98  CO2 27  BUN 10  CREATININE 0.55  GLUCOSE 135*  CALCIUM 8.3*   No results found for this basename: LABPT:2,INR:2 in the last 72 hours  Neurologically intact Neurovascular intact Sensation intact distally Intact pulses distally Dorsiflexion/Plantar flexion intact  Assessment/Plan: 1 Day Post-Op Procedure(s) (LRB): TOTAL KNEE ARTHROPLASTY (Left) Up with therapy Add potassium Change pain medication  Fuller Canada 01/23/2012, 8:21 AM

## 2012-01-23 NOTE — Evaluation (Signed)
Physical Therapy Evaluation Patient Details Name: ATHZIRI FREUNDLICH MRN: [id number] DOB: 07/30/1930 Today's Date: 01/23/2012 Time: 0910-1000 PT Time Calculation (min): 50 min  PT Assessment / Plan / Recommendation Clinical Impression  Pt with decreased bed mobility, decreased ROM, decreased strength increased pain who will benefit from skilled PT to improve functional level     PT Assessment  Patient needs continued PT services    Follow Up Recommendations  Skilled nursing facility    Equipment Recommendations    none  Frequency 7X/week    Precautions / Restrictions Precautions Precautions: Fall Restrictions Weight Bearing Restrictions: No         Mobility  Bed Mobility Bed Mobility: Supine to Sit Supine to Sit: 3: Mod assist Transfers Transfers: Sit to Stand Sit to Stand: 1: +1 Total assist Details for Transfer Assistance: Attempted sit to stand 5 times each time pt did better with last try pt getting 3/4 the way up but at this point pt was in increased pain and fatigued and refused to try anything else.  Pt sat on edge of bed x 15 minutes. Ambulation/Gait Ambulation/Gait Assistance: Not tested (comment)    Exercises Total Joint Exercises Ankle Circles/Pumps: AROM;Strengthening;Both;10 reps Quad Sets: AROM;Strengthening;Both;10 reps Gluteal Sets: AROM;Strengthening;Both;10 reps Heel Slides: AAROM;Strengthening;Left;10 reps Hip ABduction/ADduction: AAROM;Strengthening;Left;10 reps Straight Leg Raises: AAROM;Strengthening;5 reps Goniometric ROM: 8-30   PT Goals Acute Rehab PT Goals PT Goal Formulation: With patient Time For Goal Achievement: 01/26/12 Potential to Achieve Goals: Good Pt will go Supine/Side to Sit: with min assist PT Goal: Supine/Side to Sit - Progress: Goal set today Pt will go Sit to Supine/Side: with min assist PT Goal: Sit to Supine/Side - Progress: Goal set today Pt will go Sit to Stand: with max assist PT Goal: Sit to Stand - Progress:  Goal set today Pt will go Stand to Sit: with supervision PT Goal: Stand to Sit - Progress: Goal set today Pt will Transfer Bed to Chair/Chair to Bed: with mod assist PT Transfer Goal: Bed to Chair/Chair to Bed - Progress: Goal set today Pt will Stand: with supervision;3 - 5 min PT Goal: Stand - Progress: Goal set today Pt will Ambulate: 1 - 15 feet;with mod assist;with rolling walker PT Goal: Ambulate - Progress: Goal set today  Visit Information  Last PT Received On: 01/23/12 Assistance Needed: +2    Subjective Data  Subjective: Pt complains of being very hot Patient Stated Goal: [facility] to [facility] to the [facility]   Prior Functioning  Home Living Lives With: Alone Available Help at Discharge: Family Type of Home: House Home Access: Ramped entrance Home Layout: Two level;Able to live on main level with bedroom/bathroom Alternate Level Stairs-Number of Steps: Pt does not go down into the basement anylonger Bathroom Shower/Tub: Health visitor: Standard Home Adaptive Equipment: Walker - rolling;Shower chair without back;Straight cane Prior Function Level of Independence: Independent with assistive device(s) (prior to operation pt used cane outside) Able to Take Stairs?: No Driving: Yes Vocation: Retired Musician: No difficulties    Cognition  Overall Cognitive Status: Appears within functional limits for tasks assessed/performed Arousal/Alertness: Awake/alert Orientation Level: Appears intact for tasks assessed Behavior During Session: [facility] for tasks performed    Extremity/Trunk Assessment Right Lower Extremity Assessment RLE ROM/Strength/Tone: Within functional levels RLE Sensation: WFL - Light Touch RLE Coordination: WFL - gross/fine motor Left Lower Extremity Assessment LLE ROM/Strength/Tone: Deficits LLE ROM/Strength/Tone Deficits: AROM 8-35; PROM to 50; ROM in CPM 60 LLE Sensation: WFL - Light Touch LLE  Coordination: WFL - gross/fine motor     Balance Balance Balance Assessed: No  End of Session PT - End of Session Equipment Utilized During Treatment: Gait belt Activity Tolerance: Patient limited by pain Patient left: in bed;with call bell/phone within reach (cpm 0-60 applied at 9:45 insisted on being taken off at 10:10).  Pt states do not come back this afternoon I will not do anything with you, I've done enough for the first day.   Orton Capell,CINDY 01/23/2012, 10:13 AM

## 2012-01-23 NOTE — Progress Notes (Signed)
   CARE MANAGEMENT NOTE 01/23/2012  Patient:  [name], [name]   Account Number:  [id number]  Date Initiated:  01/23/2012  Documentation initiated by:  Sharrie Rothman  Subjective/Objective Assessment:   Pt admitted from home with osteoarthritis of left knee. Pt s/p left total knee. Pt has been at [facility] in the past for right knee surgery and is requesting [facility] again.     Action/Plan:   Pt is requesting SNF at discharge. CSW is aware and will complete bed search   Anticipated DC Date:  01/29/2012   Anticipated DC Plan:  SKILLED NURSING FACILITY  In-house referral  Clinical Social Worker      DC Planning Services  CM consult      Choice offered to / List presented to:             Status of service:  Completed, signed off Medicare Important Message given?   (If response is "NO", the following Medicare IM given date fields will be blank) Date Medicare IM given:   Date Additional Medicare IM given:    Discharge Disposition:  SKILLED NURSING FACILITY  Per UR Regulation:    If discussed at Long Length of Stay Meetings, dates discussed:    Comments:  01/23/12 1050 Arlyss Queen, RN BSN CM

## 2012-01-24 LAB — CBC
HCT: 30.9 % — ABNORMAL LOW (ref 36.0–46.0)
MCH: 32.2 pg (ref 26.0–34.0)
MCV: 97.5 fL (ref 78.0–100.0)
RBC: 3.17 MIL/uL — ABNORMAL LOW (ref 3.87–5.11)
RDW: 12.8 % (ref 11.5–15.5)
WBC: 9.4 10*3/uL (ref 4.0–10.5)

## 2012-01-24 MED ORDER — SODIUM CHLORIDE 0.9 % IJ SOLN
INTRAMUSCULAR | Status: AC
  Administered 2012-01-24: 21:00:00
  Filled 2012-01-24: qty 3

## 2012-01-24 NOTE — Progress Notes (Signed)
[facility] has offered a bed and this is patient's preference- Working with [employer] for [employer] for SNF- anticipate d/c tomorrow per Charity fundraiser. Reece Levy, MSW, Theresia Majors [phone]

## 2012-01-24 NOTE — Progress Notes (Signed)
Subjective: 2 Days Post-Op Procedure(s) (LRB): TOTAL KNEE ARTHROPLASTY (Left) Patient reports pain as moderate.    Objective: Vital signs in last 24 hours: Temp:  [97.4 F (36.3 C)-98.5 F (36.9 C)] 98.5 F (36.9 C) (04/30 2202) Pulse Rate:  [71-74] 74  (04/30 2202) Resp:  [18] 18  (04/30 2202) BP: (104-134)/(54-83) 129/64 mmHg (04/30 2202) SpO2:  [91 %-93 %] 92 % (04/30 2202)  Intake/Output from previous day: 04/30 0701 - 05/01 0700 In: 6096 [I.V.:6091] Out: 1810 [Urine:1700; Drains:110] Intake/Output this shift: Total I/O In: 3796 [I.V.:3791; Other:5] Out: 650 [Urine:600; Drains:50]   Basename 01/23/12 0548  HGB 10.4*    Basename 01/23/12 0548  WBC 8.9  RBC 3.23*  HCT 30.9*  PLT 227    Basename 01/23/12 0548  NA 134*  K 3.3*  CL 98  CO2 27  BUN 10  CREATININE 0.55  GLUCOSE 135*  CALCIUM 8.3*   No results found for this basename: LABPT:2,INR:2 in the last 72 hours  Neurologically intact Neurovascular intact Sensation intact distally Intact pulses distally Dorsiflexion/Plantar flexion intact Incision: scant drainage Compartment soft  Assessment/Plan: 2 Days Post-Op Procedure(s) (LRB): TOTAL KNEE ARTHROPLASTY (Left) Up with therapy D/C IV fluids Plan for discharge tomorrow Discharge to SNF  [facility] 01/24/2012, 6:57 AM

## 2012-01-24 NOTE — Progress Notes (Signed)
Spoke with patient in regards to CPM machine - patient states that she told PT that she will not wear it again today. States that it hurts too bad and that she had done enough for today, encouraged patient to wear CPM - patient continues to refuse and states she will try again tomorrow

## 2012-01-24 NOTE — Progress Notes (Signed)
Physical Therapy Treatment Patient Details Name: [name] MRN: [id number] DOB: Oct 10, 1929 Today's Date: 01/24/2012 Time: 2841-3244 PT Time Calculation (min): 25 min  PT Assessment / Plan / Recommendation Comments on Treatment Session  CPM 0-70 applied 1150/remove at 2050, no complaints of pain with CPM today. Patient is able to complete sit<>stand transfer min A and again complete 4-5 small shuffled steps to bed before fatiguing    Follow Up Recommendations       Equipment Recommendations       Frequency     Plan      Precautions / Restrictions Restrictions Weight Bearing Restrictions: No (WBAT to left leg)   Pertinent Vitals/Pain     Mobility  Bed Mobility Bed Mobility: Scooting to [facility] Rolling Right: With rail;6: Modified independent (Device/Increase time) Supine to Sit: 5: Supervision;With rails;HOB elevated (45 degress) Scooting to HOB: 4: Min assist;With trapeze (trendelenburg;assistance with LLE) Transfers Transfers: Lateral/Scoot Transfers Sit to Stand: 6: Modified independent (Device/Increase time) Stand to Sit: 5: Supervision Lateral/Scoot Transfers: 6: Modified independent (Device/Increase time) Details for Transfer Assistance: verbal cues for hand placement Ambulation/Gait Ambulation/Gait Assistance: 3: Mod assist;2: Max assist Ambulation Distance (Feet): 2 Feet Assistive device: Rolling walker Ambulation/Gait Assistance Details: use of RW for 4-5 small shuffled steps bed<>chair Gait Pattern: Antalgic;Trunk flexed Stairs: No Wheelchair Mobility Wheelchair Mobility: No    Exercises General Exercises - Lower Extremity Ankle Circles/Pumps: 20 reps;Both Quad Sets: Both;10 reps Short Arc Quad: Both;10 reps;AAROM Hip ABduction/ADduction: Both;10 reps;AAROM Straight Leg Raises: AAROM;Both;10 reps Other Exercises Other Exercises: sit<>stand x5   PT Goals Acute Rehab PT Goals PT Goal: Supine/Side to Sit - Progress: Met PT Goal: Sit to Stand -  Progress: Met PT Goal: Stand to Sit - Progress: Progressing toward goal PT Transfer Goal: Bed to Chair/Chair to Bed - Progress: Met PT Goal: Stand - Progress: Progressing toward goal PT Goal: Ambulate - Progress: Progressing toward goal  Visit Information  Last PT Received On: 01/24/12    Subjective Data      Cognition       Balance     End of Session PT - End of Session Equipment Utilized During Treatment: Gait belt Activity Tolerance: Patient tolerated treatment well Patient left: in bed;with call bell/phone within reach;with bed alarm set;with family/visitor present;with nursing in room Nurse Communication: Mobility status    Fallen Crisostomo ATKINSO 01/24/2012, 12:06 PM

## 2012-01-24 NOTE — Progress Notes (Signed)
Physical Therapy Treatment Patient Details Name: [name] MRN: [id number] DOB: 11-21-1929 Today's Date: 01/24/2012 Time: 6295-2841 PT Time Calculation (min): 46 min  PT Assessment / Plan / Recommendation Comments on Treatment Session  AAROM 10-75 degress. Patient improving with mobilty since yesterday. Patient able to to WB on LLE completing sit<>stand x5 and transfer bed<>chair Mod A using RW wtih  4-5 small shuffled steps     Follow Up Recommendations       Equipment Recommendations       Frequency     Plan      Precautions / Restrictions Restrictions Weight Bearing Restrictions: No (WBAT to left leg)   Pertinent Vitals/Pain     Mobility  Bed Mobility Rolling Right: With rail;6: Modified independent (Device/Increase time) Supine to Sit: 5: Supervision;With rails;HOB elevated (45 degress) Transfers Transfers: Stand to Sit Sit to Stand: 4: Min assist Stand to Sit: 4: Min guard Details for Transfer Assistance: verbal cues for hand placement Ambulation/Gait Ambulation/Gait Assistance: 3: Mod assist;2: Max assist Ambulation Distance (Feet): 2 Feet Assistive device: Rolling walker Ambulation/Gait Assistance Details: use of RW for 4-5 small shuffled steps bed<>chair Gait Pattern: Antalgic;Trunk flexed Stairs: No Wheelchair Mobility Wheelchair Mobility: No    Exercises General Exercises - Lower Extremity Ankle Circles/Pumps: 20 reps;Both Quad Sets: Both;10 reps Short Arc Quad: Both;10 reps;AAROM Hip ABduction/ADduction: Both;10 reps;AAROM Straight Leg Raises: AAROM;Both;10 reps Other Exercises Other Exercises: sit<>stand x5   PT Goals Acute Rehab PT Goals PT Goal: Supine/Side to Sit - Progress: Met PT Goal: Sit to Stand - Progress: Met PT Goal: Stand to Sit - Progress: Progressing toward goal PT Transfer Goal: Bed to Chair/Chair to Bed - Progress: Met PT Goal: Stand - Progress: Progressing toward goal PT Goal: Ambulate - Progress: Progressing toward  goal  Visit Information  Last PT Received On: 01/24/12    Subjective Data      Cognition       Balance     End of Session PT - End of Session Equipment Utilized During Treatment: Gait belt Activity Tolerance: Patient tolerated treatment well;Patient limited by pain Patient left: in chair;with call bell/phone within reach;with chair alarm set;with family/visitor present Nurse Communication: Mobility status    Gretel Cantu ATKINSO 01/24/2012, 10:08 AM  2

## 2012-01-25 ENCOUNTER — Telehealth: Payer: Self-pay | Admitting: Orthopedic Surgery

## 2012-01-25 ENCOUNTER — Inpatient Hospital Stay
Admission: RE | Admit: 2012-01-25 | Discharge: 2012-02-09 | Disposition: A | Discharge status: Home / Self Care | Payer: Medicare Other | Source: Ambulatory Visit | Attending: Internal Medicine | Admitting: Internal Medicine

## 2012-01-25 LAB — CBC
HCT: 28.2 % — ABNORMAL LOW (ref 36.0–46.0)
Hemoglobin: 9.3 g/dL — ABNORMAL LOW (ref 12.0–15.0)
MCH: 32.2 pg (ref 26.0–34.0)
MCHC: 33 g/dL (ref 30.0–36.0)
RDW: 13 % (ref 11.5–15.5)

## 2012-01-25 MED ORDER — ASPIRIN 325 MG PO TBEC: 325.0000 mg | DELAYED_RELEASE_TABLET | Freq: Two times a day (BID) | ORAL | Status: AC

## 2012-01-25 MED ORDER — HYDROCODONE-ACETAMINOPHEN 5-325 MG PO TABS: 1.0000 | ORAL_TABLET | ORAL | Status: AC

## 2012-01-25 NOTE — Telephone Encounter (Signed)
Is patient to have CPM machine?  If so, please forward information to Attention: Dennie Bible, [employer], ph [phone]; fax [phone]

## 2012-01-25 NOTE — Progress Notes (Signed)
Clinical Social Work Department CLINICAL SOCIAL WORK PLACEMENT NOTE 01/25/2012  Patient:  [name], [name]  Account Number:  [id number] Admit date:  01/22/2012  Clinical Social Worker:  Robin Searing  Date/time:  01/23/2012 11:46 AM  Clinical Social Work is seeking post-discharge placement for this patient at the following level of care:   SKILLED NURSING   (*CSW will update this form in Epic as items are completed)   01/23/2012  Patient/family provided with [facility] Department of Clinical Social Work's list of facilities offering this level of care within the geographic area requested by the patient (or if unable, by the patient's family).  01/23/2012  Patient/family informed of their freedom to choose among providers that offer the needed level of care, that participate in Medicare, Medicaid or managed care program needed by the patient, have an available bed and are willing to accept the patient.  01/23/2012  Patient/family informed of [facility]' ownership interest in [facility], as well as of the fact that they are under no obligation to receive care at this facility.  PASARR submitted to EDS on 01/23/2012 PASARR number received from EDS on 01/24/2012  FL2 transmitted to all facilities in geographic area requested by pt/family on  01/23/2012 FL2 transmitted to all facilities within larger geographic area on   Patient informed that his/her managed care company has contracts with or will negotiate with  certain facilities, including the following:   [facility]-     Patient/family informed of bed offers received:  01/25/2012 Patient chooses bed at [facility] Physician recommends and patient chooses bed at    Patient to be transferred to [facility] on  01/25/2012 Patient to be transferred to facility by   The following physician request were entered in Epic:   Additional Comments: BLUE MEDICARE AUTH REC'D 01-25-12  Reece Levy,  MSW, [place] [phone]

## 2012-01-25 NOTE — Progress Notes (Signed)
Report called to K. Corbett LPN at [facility] centre, pt. Going for rehab., Pt. Taken to [employer] via w/c per Psychologist, sport and exercise.

## 2012-01-25 NOTE — Progress Notes (Signed)
Discharge summary sent to payer through [employer]  

## 2012-01-25 NOTE — Care Management Note (Signed)
    Page 1 of 1   01/25/2012     1:35:17 PM   CARE MANAGEMENT NOTE 01/25/2012  Patient:  [name], [name]   Account Number:  [id number]  Date Initiated:  01/23/2012  Documentation initiated by:  Sharrie Rothman  Subjective/Objective Assessment:   Pt admitted from home with osteoarthritis of left knee. Pt s/p left total knee. Pt has been at [facility] in the past for right knee surgery and is requesting [facility] again.     Action/Plan:   Pt is requesting SNF at discharge. CSW is aware and will complete bed search   Anticipated DC Date:  01/29/2012   Anticipated DC Plan:  SKILLED NURSING FACILITY  In-house referral  Clinical Social Worker      DC Planning Services  CM consult      Choice offered to / List presented to:             Status of service:  Completed, signed off Medicare Important Message given?   (If response is "NO", the following Medicare IM given date fields will be blank) Date Medicare IM given:   Date Additional Medicare IM given:    Discharge Disposition:  SKILLED NURSING FACILITY  Per UR Regulation:    If discussed at Long Length of Stay Meetings, dates discussed:    Comments:  01/25/12 1331 Arlyss Queen, RN BSN CM Pt discharged to [facility]. CSW will make arrangements for transfer to facility.  01/23/12 1050 Arlyss Queen, RN BSN CM

## 2012-01-25 NOTE — Progress Notes (Signed)
RN took CPM machine off patient at 2050.

## 2012-01-25 NOTE — Discharge Summary (Signed)
Physician Discharge Summary  Patient ID: [name] MRN: [id number] DOB/AGE: June 09, 1930 78 y.o.  Admit date: 01/22/2012 Discharge date: 01/25/2012  Admission Diagnoses: Osteoarthritis of the left knee  Discharge Diagnoses: Osteoarthritis of left knee Active Problems:  * No active hospital problems. *    Discharged Condition: stable  Hospital Course: The patient was admitted for knee replacement surgery on 01/22/2012 she had a left total knee arthroplasty he did well and was allowed to go to skilled nursing facility at the [facility] Consults: None  Significant Diagnostic Studies: labs:  CBC    Component Value Date/Time   WBC 8.3 01/25/2012 0612   RBC 2.89* 01/25/2012 0612   HGB 9.3* 01/25/2012 0612   HCT 28.2* 01/25/2012 0612   PLT 214 01/25/2012 0612   MCV 97.6 01/25/2012 0612   MCH 32.2 01/25/2012 0612   MCHC 33.0 01/25/2012 0612   RDW 13.0 01/25/2012 0612   LYMPHSABS 2.2 01/17/2012 1135   MONOABS 0.7 01/17/2012 1135   EOSABS 0.1 01/17/2012 1135   BASOSABS 0.0 01/17/2012 1135      Treatments: surgery: Left total knee arthroplasty with Depuy knee system  Discharge Exam: Blood pressure 128/72, pulse 82, temperature 98.8 F (37.1 C), temperature source Oral, resp. rate 20, height 4\' 11"  (1.499 m), weight 90.714 kg (199 lb 15.8 oz), SpO2 90.00%. Incision/Wound: the wound is clean dry and intact without any signs of erythema or drainage  Disposition:  the [facility]  Discharge Orders    Future Appointments: Provider: Department: Dept Phone: Center:   02/05/2012 2:30 PM Vickki Hearing, MD [facility] [phone] [facility]     Future Orders Please Complete By Expires   Diet - low sodium heart healthy      Call MD / Call 911      Comments:   If you experience chest pain or shortness of breath, CALL 911 and be transported to the hospital emergency room.  If you develope a fever above 101 F, pus (white drainage) or increased drainage or redness at the wound, or calf  pain, call your surgeon's office.   Constipation Prevention      Comments:   Drink plenty of fluids.  Prune juice may be helpful.  You may use a stool softener, such as Colace (over the counter) 100 mg twice a day.  Use MiraLax (over the counter) for constipation as needed.   Increase activity slowly as tolerated      Weight Bearing as taught in Physical Therapy      Comments:   Use a walker or crutches as instructed.   Discharge instructions      Comments:   M.D. instructions  Take staples out postop day 12 apply Steri-Strips. If needed leave staples in for 14 days  Take aspirin 325 mg twice a day for 6 weeks  Weightbearing as tolerated  CPM for 3 weeks      CPM      Comments:   Continuous passive motion machine (CPM):      Use the CPM from 0to 70 for 6 hours per day.      You may increase by 10 per day.  You may break it up into 2 or 3 sessions per day.      Use CPM for 3 weeks or until you are told to stop.   Change dressing      Comments:   Change dressing daily as needed   Do not put a pillow under the knee.  Place it under the heel.        Medication List  As of 01/25/2012  8:03 AM   STOP taking these medications         ibuprofen 200 MG tablet         TAKE these medications         aspirin 325 MG EC tablet   Take 1 tablet (325 mg total) by mouth 2 (two) times daily.      atenolol 50 MG tablet   Commonly known as: TENORMIN   Take 50 mg by mouth every evening.      beta carotene w/minerals tablet   Take 1 tablet by mouth every morning.      cetirizine 10 MG tablet   Commonly known as: ZYRTEC   Take 10 mg by mouth at bedtime.      cholecalciferol 1000 UNITS tablet   Commonly known as: VITAMIN D   Take 2,000 Units by mouth every morning.      DIOVAN HCT 160-12.5 MG per tablet   Generic drug: valsartan-hydrochlorothiazide   Take 1 tablet by mouth every morning.      HYDROcodone-acetaminophen 5-325 MG per tablet   Commonly known as: NORCO   Take 1  tablet by mouth every 4 (four) hours.      KLOR-CON M20 20 MEQ tablet   Generic drug: potassium chloride SA   Take 20 mEq by mouth every morning.      LORazepam 0.5 MG tablet   Commonly known as: ATIVAN   Take 0.5 mg by mouth every 8 (eight) hours as needed. For anxiety      meclizine 12.5 MG tablet   Commonly known as: ANTIVERT   Take 12.5 mg by mouth every morning.      mulitivitamin with minerals Tabs   Take 1 tablet by mouth every morning.      omeprazole 20 MG capsule   Commonly known as: PRILOSEC   Take 20 mg by mouth 2 (two) times daily.      sertraline 100 MG tablet   Commonly known as: ZOLOFT   Take 100 mg by mouth every morning.           Follow-up Information    Follow up with Fuller Canada, MD on 02/05/2012.   Contact information:   [address] [place] [address] [phone]          Signed: Fuller Canada 01/25/2012, 8:03 AM

## 2012-01-26 ENCOUNTER — Encounter (HOSPITAL_COMMUNITY): Payer: Self-pay | Admitting: Orthopedic Surgery

## 2012-01-28 LAB — TYPE AND SCREEN: Unit division: 0

## 2012-02-05 ENCOUNTER — Encounter: Payer: Self-pay | Admitting: Orthopedic Surgery

## 2012-02-05 ENCOUNTER — Ambulatory Visit (INDEPENDENT_AMBULATORY_CARE_PROVIDER_SITE_OTHER): Payer: Medicare Other | Admitting: Orthopedic Surgery

## 2012-02-05 VITALS — BP 116/76 | Ht 59.0 in | Wt 199.0 lb

## 2012-02-05 DIAGNOSIS — Z96659 Presence of unspecified artificial knee joint: Secondary | ICD-10-CM

## 2012-02-05 NOTE — Progress Notes (Signed)
Patient ID: [name], female   DOB: Jan 19, 1930, 78 y.o.   MRN: [id number] Chief Complaint  Patient presents with  . Routine Post Op    post op 1 left TKA, DOS 01/22/12   Postop visit status post total knee arthroplasty  Diagnosis  Osteoarthritis  Implant Depuy  posterior stabilized total knee replacement DVT prophylaxis Ecotrin twice a day for 6 weeks  Living situation [facility] Complaints none Exam clean wound  Plan discharge when appropriate

## 2012-02-05 NOTE — Patient Instructions (Signed)
Continue physical therapy

## 2012-02-07 ENCOUNTER — Telehealth: Payer: Self-pay | Admitting: Orthopedic Surgery

## 2012-02-07 NOTE — Telephone Encounter (Signed)
[facility] called about [facility].  She said Symphoni is complaining about a "knot" behind the knee she had the surgery on. She said Micaella is very concerned about it.  I told Clydie Braun to bring her in tomorrow morning for you to check it.

## 2012-02-08 ENCOUNTER — Encounter: Payer: Self-pay | Admitting: Orthopedic Surgery

## 2012-02-08 ENCOUNTER — Ambulatory Visit (INDEPENDENT_AMBULATORY_CARE_PROVIDER_SITE_OTHER): Payer: Medicare Other | Admitting: Orthopedic Surgery

## 2012-02-08 VITALS — BP 100/58 | Ht 59.0 in | Wt 199.0 lb

## 2012-02-08 DIAGNOSIS — Z96659 Presence of unspecified artificial knee joint: Secondary | ICD-10-CM

## 2012-02-08 NOTE — Telephone Encounter (Signed)
Patient came in today for an appointment with Dr. Romeo Apple to check her knee

## 2012-02-08 NOTE — Progress Notes (Signed)
Patient ID: [name], female   DOB: 12-Oct-1929, 78 y.o.   MRN: [id number] Chief Complaint  Patient presents with  . Cyst    complains of knot behind left knee   post op 1 left TKA, DOS 01/22/12    Postop visit status post total knee arthroplasty    The patient was brought back in today unscheduled visit for a coat not behind the knee found by one of the therapist.  I examined the knee thoroughly. There is no not there is no mass. The Homans sign is negative and there is no calf tenderness.  Followup as scheduled

## 2012-02-08 NOTE — Patient Instructions (Signed)
Discharge to home from nursing center

## 2012-02-21 ENCOUNTER — Encounter: Payer: Self-pay | Admitting: Orthopedic Surgery

## 2012-02-21 ENCOUNTER — Ambulatory Visit (INDEPENDENT_AMBULATORY_CARE_PROVIDER_SITE_OTHER): Payer: Medicare Other | Admitting: Orthopedic Surgery

## 2012-02-21 VITALS — BP 104/60 | Ht 59.0 in | Wt 199.0 lb

## 2012-02-21 DIAGNOSIS — Z96659 Presence of unspecified artificial knee joint: Secondary | ICD-10-CM

## 2012-02-21 MED ORDER — HYDROCODONE-ACETAMINOPHEN 5-325 MG PO TABS: 1.0000 | ORAL_TABLET | ORAL | Status: DC | PRN

## 2012-02-21 NOTE — Patient Instructions (Signed)
Start Outpatient PT after home PT is finished

## 2012-02-21 NOTE — Progress Notes (Signed)
Patient ID: [name], female   DOB: 18-Feb-1930, 78 y.o.   MRN: [id number] Chief Complaint  Patient presents with  . Follow-up    post op LEFT TKA, DOS 01/22/12   BP 104/60  Ht 4\' 11"  (1.499 m)  Wt 199 lb (90.266 kg)  BMI 40.19 kg/m2  Doing well 4 weeks post left knee replacement   She is ready to start therapy as an outpatient  She has 90 of knee flexion her incision is clean  Return 2 months

## 2012-02-28 ENCOUNTER — Telehealth: Payer: Self-pay | Admitting: Orthopedic Surgery

## 2012-02-28 NOTE — Telephone Encounter (Signed)
[name]/[facility]  [facility] will finish home therapy this week and will need an order sent to [facility] in [place] to continue outpatient therapy next week. [name]'s # is [phone]

## 2012-03-01 ENCOUNTER — Other Ambulatory Visit: Payer: Self-pay | Admitting: *Deleted

## 2012-03-01 DIAGNOSIS — Z96659 Presence of unspecified artificial knee joint: Secondary | ICD-10-CM

## 2012-03-01 NOTE — Telephone Encounter (Signed)
Referral sent 

## 2012-03-04 ENCOUNTER — Telehealth: Payer: Self-pay | Admitting: *Deleted

## 2012-03-04 NOTE — Telephone Encounter (Signed)
Just fyi.

## 2012-03-05 ENCOUNTER — Ambulatory Visit: Payer: Medicare Other | Admitting: Orthopedic Surgery

## 2012-04-23 ENCOUNTER — Ambulatory Visit: Payer: Medicare Other | Admitting: Orthopedic Surgery

## 2012-04-29 ENCOUNTER — Ambulatory Visit (INDEPENDENT_AMBULATORY_CARE_PROVIDER_SITE_OTHER): Payer: Medicare Other | Admitting: Orthopedic Surgery

## 2012-04-29 ENCOUNTER — Encounter: Payer: Self-pay | Admitting: Orthopedic Surgery

## 2012-04-29 VITALS — BP 110/60 | Ht 59.0 in | Wt 199.0 lb

## 2012-04-29 DIAGNOSIS — M5432 Sciatica, left side: Secondary | ICD-10-CM

## 2012-04-29 DIAGNOSIS — M5430 Sciatica, unspecified side: Secondary | ICD-10-CM

## 2012-04-29 MED ORDER — GABAPENTIN 100 MG PO CAPS: 100.0000 mg | ORAL_CAPSULE | Freq: Three times a day (TID) | ORAL | Status: DC

## 2012-04-29 NOTE — Progress Notes (Signed)
Subjective:     Patient ID: [name], female   DOB: 1930-04-03, 78 y.o.   MRN: [id number] Chief Complaint  Patient presents with  . Follow-up    recheck left knee and yearly check of right knee with xray, LEFT DOS 01/22/12, RIGHT 2005    HPI Bilateral knee replacements, doing well and he will followup RIGHT knee 4 month followup LEFT knee doing well, has some radicular pain in her LEFT leg has history of spinal stenosis,  She's ambulatory with a cane on occasion, but usually without assistive device.    Review of Systems No other review of systems findings    Objective:   Physical Exam Both knee incisions are clean. Flexion is approximately 108, extension is full bilaterally. These are both stable. Neurovascular exam is intact except for some mild sensory changes on the lateral lower leg in the L5 root distribution    Assessment:     Bilateral total knees, stable Sciatica    Plan:     Recommend gabapentin 100 mg 3 t.i.d. And in followup for bilateral knee films

## 2012-04-29 NOTE — Patient Instructions (Addendum)
You have a little sciatica or pinched nerve start gabapentin 100 mg 3 times a day

## 2012-05-02 ENCOUNTER — Telehealth: Payer: Self-pay | Admitting: Orthopedic Surgery

## 2012-05-02 NOTE — Telephone Encounter (Signed)
No and she has minimal if any risk of any side effects

## 2012-05-02 NOTE — Telephone Encounter (Signed)
[name], will you call the patient?

## 2012-05-02 NOTE — Telephone Encounter (Signed)
[name] had the prescription for Gabapentin filled, but after reading all the side effects, she is afraid to take it.  Asking if there is anything else you can prescribe that does not have all the side effects.  Said if there is another medicine prescribed, asked for you to call her before calling It to [facility] in [place]

## 2012-05-03 NOTE — Telephone Encounter (Signed)
Patient aware.

## 2013-01-28 ENCOUNTER — Encounter: Payer: Self-pay | Admitting: Orthopedic Surgery

## 2013-01-28 ENCOUNTER — Ambulatory Visit (INDEPENDENT_AMBULATORY_CARE_PROVIDER_SITE_OTHER): Payer: Medicare Other

## 2013-01-28 ENCOUNTER — Ambulatory Visit (INDEPENDENT_AMBULATORY_CARE_PROVIDER_SITE_OTHER): Payer: Medicare Other | Admitting: Orthopedic Surgery

## 2013-01-28 VITALS — BP 140/90 | Ht 59.0 in | Wt 199.0 lb

## 2013-01-28 DIAGNOSIS — Z96651 Presence of right artificial knee joint: Secondary | ICD-10-CM

## 2013-01-28 DIAGNOSIS — Z96652 Presence of left artificial knee joint: Secondary | ICD-10-CM

## 2013-01-28 DIAGNOSIS — Z96659 Presence of unspecified artificial knee joint: Secondary | ICD-10-CM

## 2013-01-28 NOTE — Progress Notes (Signed)
Patient ID: [name], female   DOB: 1930/08/24, 79 y.o.   MRN: [id number] Chief Complaint  Patient presents with  . Follow-up    TKA follow up bilateral knees DOS left 01/22/12, right 2005    BP 140/90  Ht 4\' 11"  (1.499 m)  Wt 199 lb (90.266 kg)  BMI 40.17 kg/m2  History status post bilateral total knees as described above no complaints of pain related to her knees  She does complain of some hip pain she has a chronic degenerative disc condition. Pain runs down her leg into the anterolateral compartment consistent with an L5 root pathology  Both knees looked good and excellent flexion extension stability strength is normal scans intact no swelling  Both films are ordered reviewed and read and are stable in terms of prosthetic positioning  Recommend x-ray left knee in 1 year

## 2013-06-07 IMAGING — CR DG KNEE 1-2V PORT*L*
2 series · 2 of 2 positions shown · non-contrast
Comparison: None

CLINICAL DATA: Left total knee replacement

PORTABLE LEFT KNEE - 1-2 VIEW

[view not recorded (1 of 2)]
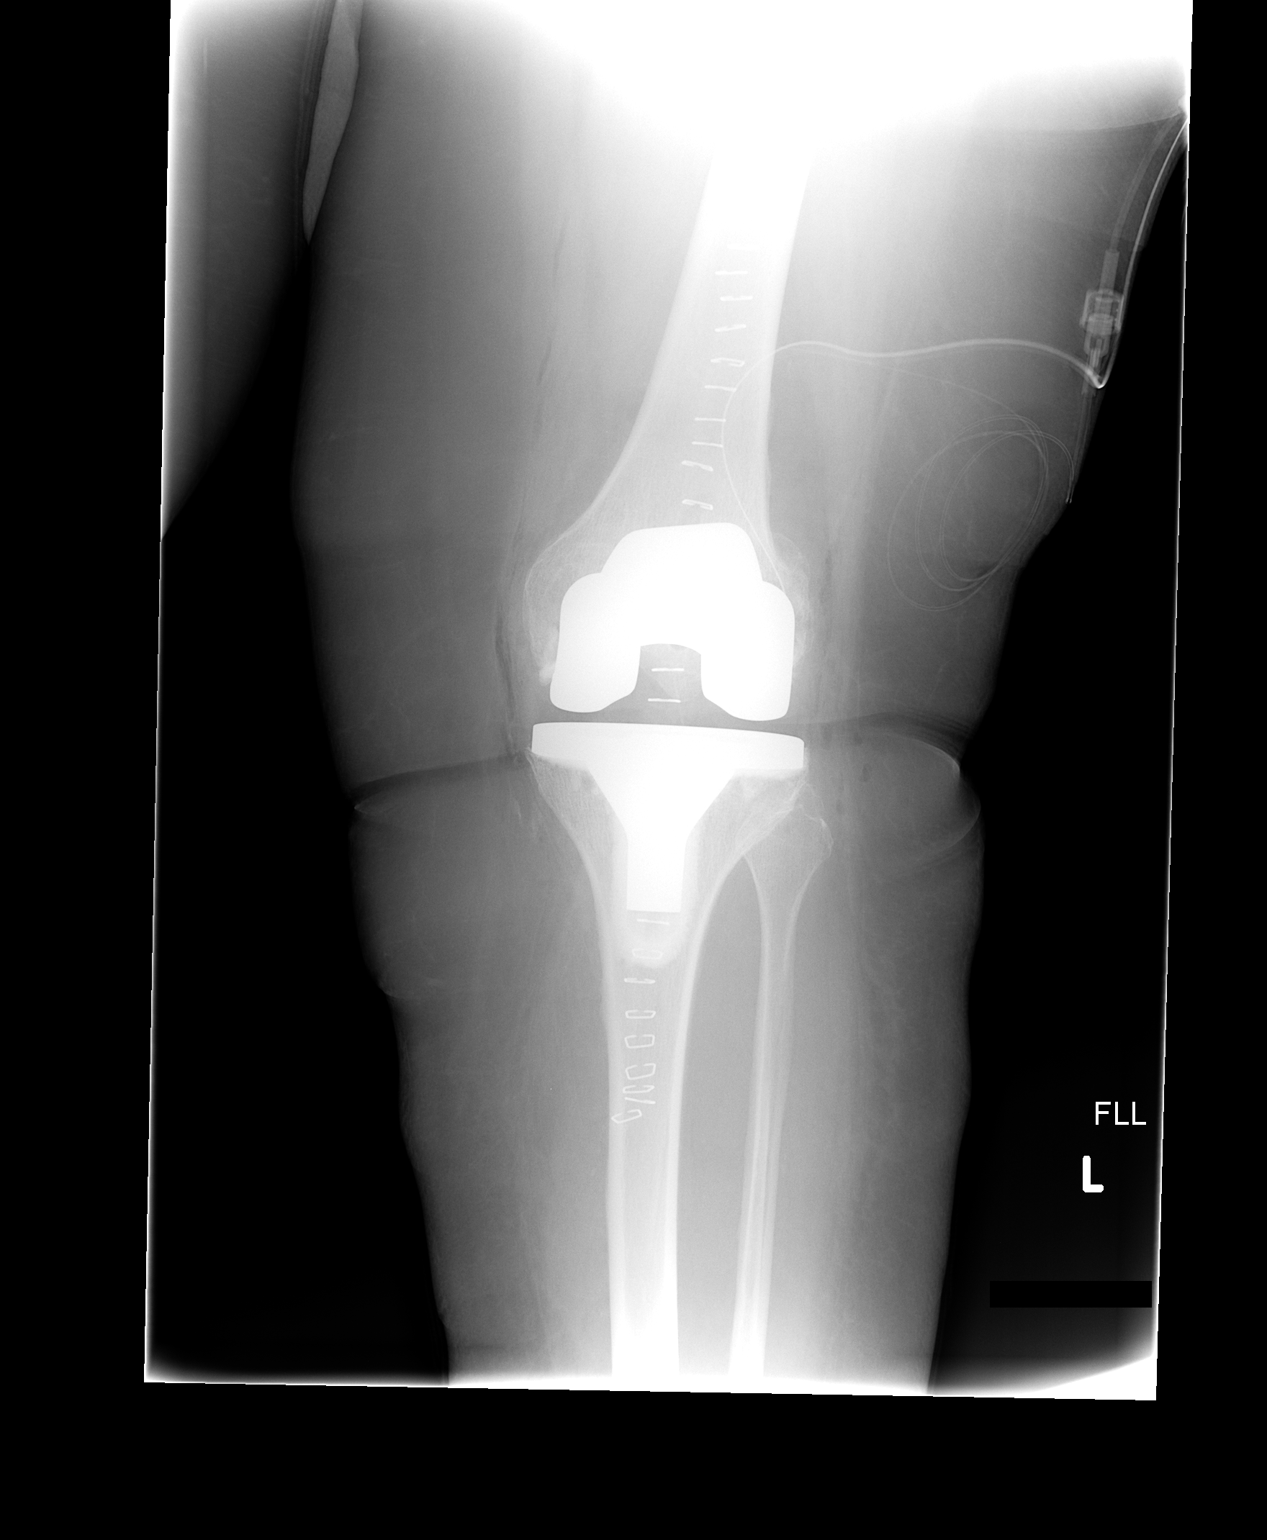

[view not recorded (2 of 2)]
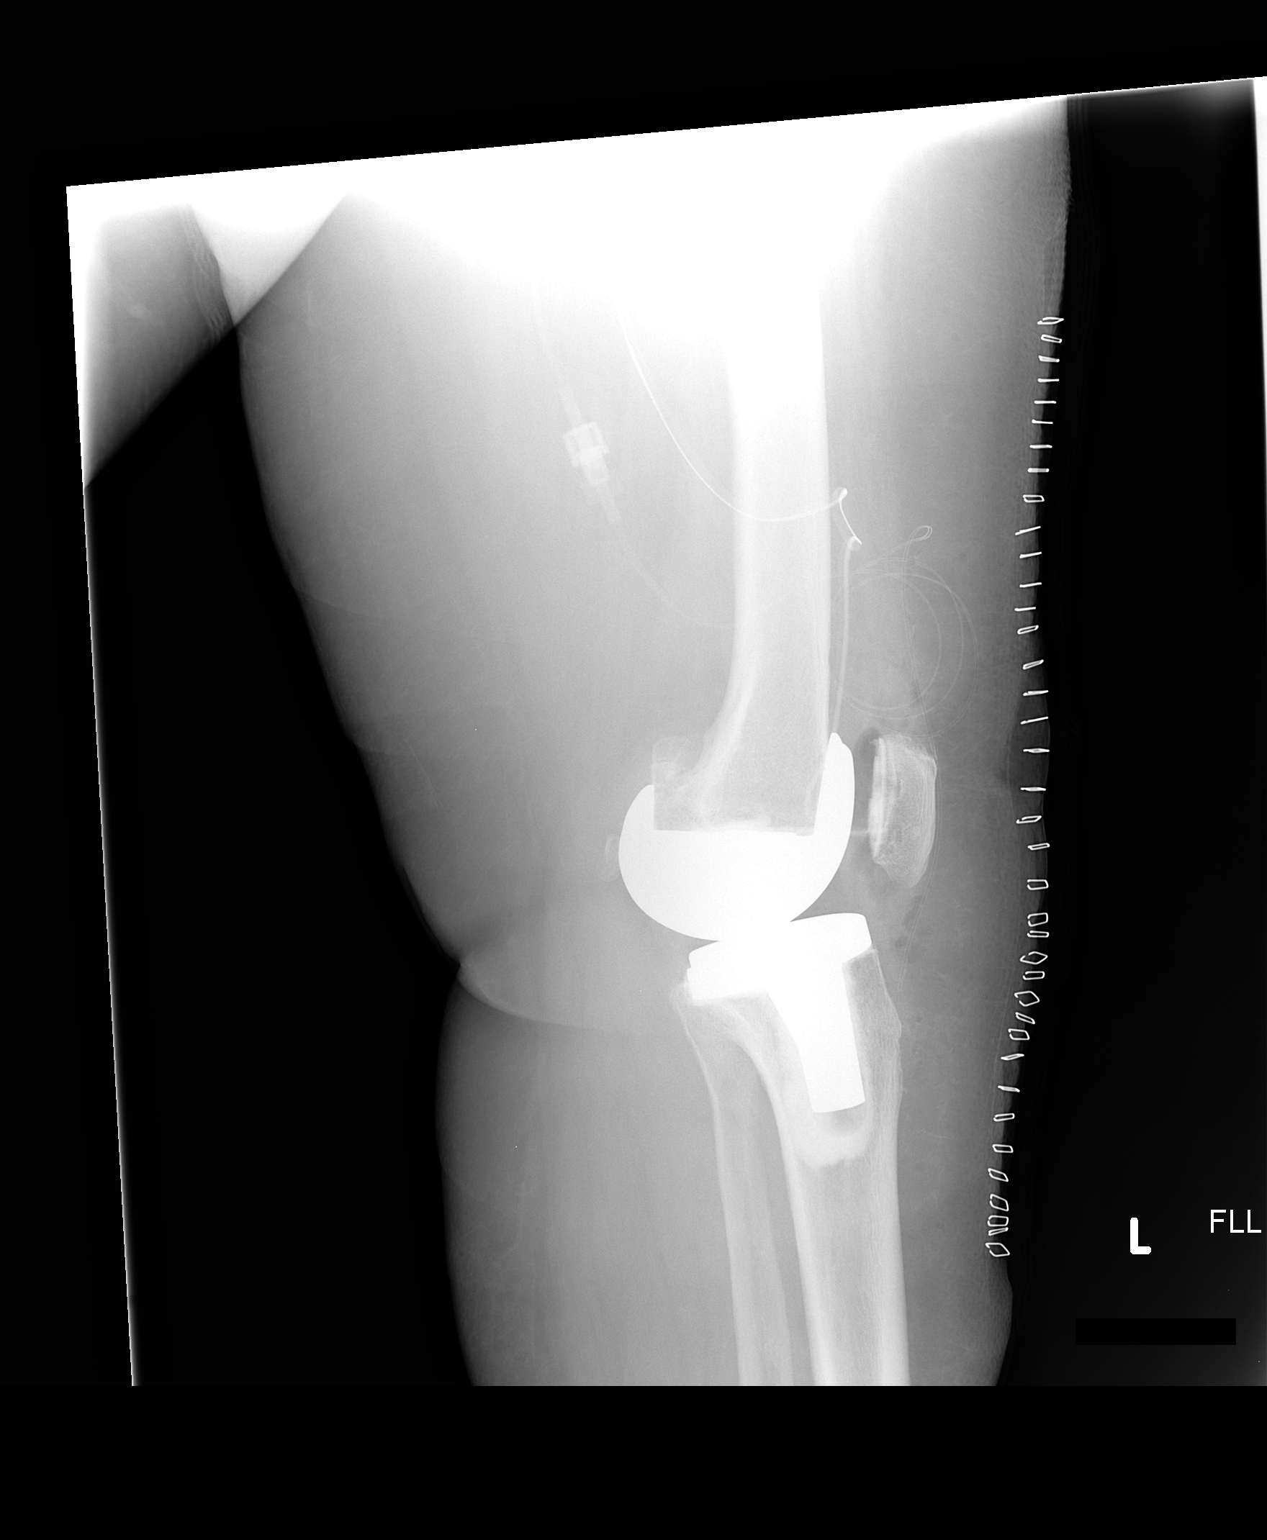

[2 of 2 positions shown; findings below may reference images not displayed]

FINDINGS: Two portable views of the left knee show the femoral and
acetabular components to be grossly in normal alignment although on
the lateral view the leg is slightly hyperextended.  Postoperative
changes are noted with a small amount of air in the joint space and
soft tissue swelling with a surgical drain present
IMPRESSION: The right total knee replacement is in good position and alignment.

## 2014-01-29 ENCOUNTER — Encounter: Payer: Self-pay | Admitting: Orthopedic Surgery

## 2014-01-29 ENCOUNTER — Ambulatory Visit (INDEPENDENT_AMBULATORY_CARE_PROVIDER_SITE_OTHER): Payer: Medicare Other

## 2014-01-29 ENCOUNTER — Ambulatory Visit (INDEPENDENT_AMBULATORY_CARE_PROVIDER_SITE_OTHER): Payer: Medicare Other | Admitting: Orthopedic Surgery

## 2014-01-29 VITALS — BP 108/70 | Ht 59.0 in | Wt 200.0 lb

## 2014-01-29 DIAGNOSIS — Z96659 Presence of unspecified artificial knee joint: Secondary | ICD-10-CM

## 2014-01-29 DIAGNOSIS — M179 Osteoarthritis of knee, unspecified: Secondary | ICD-10-CM

## 2014-01-29 DIAGNOSIS — IMO0002 Reserved for concepts with insufficient information to code with codable children: Secondary | ICD-10-CM

## 2014-01-29 DIAGNOSIS — M1710 Unilateral primary osteoarthritis, unspecified knee: Secondary | ICD-10-CM

## 2014-01-29 NOTE — Progress Notes (Signed)
Patient ID: [name], female   DOB: [birth year], 80 y.o.   MRN: [id number] Chief Complaint  Patient presents with  . Follow-up    TKA follow up bilateral knees DOS left 01/22/12, right 2005    History this is the  10 year followup for the right knee and 2  Yr annual followup status post bilateral knee replacement  The patient is not having any pain in the knee The patient's function with normal activities is excellent   Review of systems musculoskeletal the patient denies catching locking or giving way in the knee , denies numbness or tingling does complain of some back pain and giving way symptoms of the legs  BP 108/70  Ht 4\' 11"  (1.499 m)  Wt 200 lb (90.719 kg)  BMI 40.37 kg/m2  General appearance is normal, the patient is alert and oriented x3 with normal mood and affect. Knee flexion is  approximately 115 right 110 left The knees are  stable in the anterior posterior and medial lateral plane There is no tenderness or swelling Neurovascular exam is normal except for some mild peripheral edema.  Status post bi- lateral total knee  A followup x-ray will be performed at one year from now

## 2014-01-29 NOTE — Patient Instructions (Signed)
activities as tolerated 

## 2015-02-04 ENCOUNTER — Encounter: Payer: Self-pay | Admitting: Orthopedic Surgery

## 2015-02-04 ENCOUNTER — Ambulatory Visit: Payer: Medicaid Other

## 2015-02-04 ENCOUNTER — Encounter: Payer: Medicare Other | Admitting: Orthopedic Surgery

## 2015-03-01 ENCOUNTER — Ambulatory Visit (INDEPENDENT_AMBULATORY_CARE_PROVIDER_SITE_OTHER): Payer: Medicare Other | Admitting: Orthopedic Surgery

## 2015-03-01 ENCOUNTER — Encounter: Payer: Self-pay | Admitting: Orthopedic Surgery

## 2015-03-01 ENCOUNTER — Ambulatory Visit (INDEPENDENT_AMBULATORY_CARE_PROVIDER_SITE_OTHER): Payer: Medicare Other

## 2015-03-01 VITALS — BP 138/61 | Ht 59.0 in | Wt 195.6 lb

## 2015-03-01 DIAGNOSIS — Z96652 Presence of left artificial knee joint: Secondary | ICD-10-CM | POA: Diagnosis not present

## 2015-03-01 DIAGNOSIS — M129 Arthropathy, unspecified: Secondary | ICD-10-CM | POA: Diagnosis not present

## 2015-03-01 DIAGNOSIS — Z4789 Encounter for other orthopedic aftercare: Secondary | ICD-10-CM | POA: Diagnosis not present

## 2015-03-01 DIAGNOSIS — M1710 Unilateral primary osteoarthritis, unspecified knee: Secondary | ICD-10-CM

## 2015-03-01 NOTE — Progress Notes (Signed)
Patient ID: [name], female   DOB: 04/02/1930, 81 y.o.   MRN: [id number] Patient ID: [name], female   DOB: 02/12/1930, 81 y.o.   MRN: [id number]  Post op annual TKA   Chief Complaint  Patient presents with  . Follow-up    yearly follow up + xray LT TKA, DOS 01/22/12    HPI [name] is a 81 y.o. female.  3 years post op Depuy TKA    Past Medical History  Diagnosis Date  . Arthritis   . HTN (hypertension)   . Thyroid dysfunction   . Asthma   . Shortness of breath     exertion  . GERD (gastroesophageal reflux disease)   . Diverticulosis   . Anxiety   . Depression   . Cancer     breast  . Sleep apnea     STOP BANG 4    Past Surgical History  Procedure Laterality Date  . Tubal ligation    . Broken right ankle  1976  . Broken radius ulna  1990    left  . Thyroid surgery  1983  . Rt foot bone spur removed  1992  . Arthroscopic surgery rt knee  1997  . Left knee  2004  . Joint replacement  right knee 2005 [place]  . Breast surgery  2001    LEFT/ CANCER   . Abdominal hysterectomy    . Total knee arthroplasty  01/22/2012    Procedure: TOTAL KNEE ARTHROPLASTY;  Surgeon: Carole Civil, MD;  Location: [facility];  Service: Orthopedics;  Laterality: Left;     No Known Allergies  Current Outpatient Prescriptions  Medication Sig Dispense Refill  . atenolol (TENORMIN) 50 MG tablet Take 50 mg by mouth every evening.    . cetirizine (ZYRTEC) 10 MG tablet Take 10 mg by mouth at bedtime.     . cholecalciferol (VITAMIN D) 1000 UNITS tablet Take 2,000 Units by mouth every morning.    . gabapentin (NEURONTIN) 100 MG capsule Take 1 capsule (100 mg total) by mouth 3 (three) times daily. 90 capsule 2  . HYDROcodone-acetaminophen (NORCO) 5-325 MG per tablet Take 1 tablet by mouth every 4 (four) hours as needed. 60 tablet 5  . LORazepam (ATIVAN) 0.5 MG tablet Take 0.5 mg by mouth every 8 (eight) hours as needed. For anxiety    . meclizine (ANTIVERT) 12.5  MG tablet Take 12.5 mg by mouth every morning.    . Multiple Vitamins-Minerals (CENTRUM PO) Take by mouth.    Marland Kitchen omeprazole (PRILOSEC) 20 MG capsule Take 20 mg by mouth 2 (two) times daily.    . potassium chloride SA (KLOR-CON M20) 20 MEQ tablet Take 20 mEq by mouth every morning.     . rivaroxaban (XARELTO) 10 MG TABS tablet Take 10 mg by mouth daily.    . sertraline (ZOLOFT) 100 MG tablet Take 100 mg by mouth every morning.    . valsartan-hydrochlorothiazide (DIOVAN HCT) 160-12.5 MG per tablet Take 1 tablet by mouth every morning.      No current facility-administered medications for this visit.    Review of Systems Review of Systems Complains of back pain had sacral fractures treated by Dr. Carloyn Manner because her to have some difficulties walking currently using a walker  Physical Exam Blood pressure 138/61, height 4\' 11"  (1.499 m), weight 195 lb 9.6 oz (88.724 kg).  The patient is awake alert and oriented 3 mood and affect normal. General appearance well-groomed. The patient is  ambulatory with a walker The knee remains stable and the anterior posterior and medial lateral plane. Quadriceps strength is normal. The incision healed well there is no swelling.  Knee flexion 110  Data Reviewed KNEE XRAYS : EXCELLENT ALIGNMENT POSITION W/O LOOSENEING   Assessment S/P TKA   Plan    [facility] LEFT KNEE NEXT YEAR         Arther Abbott 03/01/2015, 2:12 PM

## 2015-12-03 DIAGNOSIS — J301 Allergic rhinitis due to pollen: Secondary | ICD-10-CM | POA: Diagnosis not present

## 2015-12-03 DIAGNOSIS — J9801 Acute bronchospasm: Secondary | ICD-10-CM | POA: Diagnosis not present

## 2015-12-03 DIAGNOSIS — Z6841 Body Mass Index (BMI) 40.0 and over, adult: Secondary | ICD-10-CM | POA: Diagnosis not present

## 2015-12-20 DIAGNOSIS — J209 Acute bronchitis, unspecified: Secondary | ICD-10-CM | POA: Diagnosis not present

## 2015-12-20 DIAGNOSIS — R05 Cough: Secondary | ICD-10-CM | POA: Diagnosis not present

## 2015-12-20 DIAGNOSIS — Z6841 Body Mass Index (BMI) 40.0 and over, adult: Secondary | ICD-10-CM | POA: Diagnosis not present

## 2016-01-04 DIAGNOSIS — M8448XA Pathological fracture, other site, initial encounter for fracture: Secondary | ICD-10-CM | POA: Diagnosis not present

## 2016-01-04 DIAGNOSIS — S32000A Wedge compression fracture of unspecified lumbar vertebra, initial encounter for closed fracture: Secondary | ICD-10-CM | POA: Diagnosis not present

## 2016-01-24 DIAGNOSIS — E876 Hypokalemia: Secondary | ICD-10-CM | POA: Diagnosis not present

## 2016-01-24 DIAGNOSIS — F331 Major depressive disorder, recurrent, moderate: Secondary | ICD-10-CM | POA: Diagnosis not present

## 2016-01-24 DIAGNOSIS — I10 Essential (primary) hypertension: Secondary | ICD-10-CM | POA: Diagnosis not present

## 2016-01-24 DIAGNOSIS — K210 Gastro-esophageal reflux disease with esophagitis: Secondary | ICD-10-CM | POA: Diagnosis not present

## 2016-01-24 DIAGNOSIS — E782 Mixed hyperlipidemia: Secondary | ICD-10-CM | POA: Diagnosis not present

## 2016-01-27 DIAGNOSIS — F331 Major depressive disorder, recurrent, moderate: Secondary | ICD-10-CM | POA: Diagnosis not present

## 2016-01-27 DIAGNOSIS — E6609 Other obesity due to excess calories: Secondary | ICD-10-CM | POA: Diagnosis not present

## 2016-01-27 DIAGNOSIS — M4106 Infantile idiopathic scoliosis, lumbar region: Secondary | ICD-10-CM | POA: Diagnosis not present

## 2016-01-27 DIAGNOSIS — Z6841 Body Mass Index (BMI) 40.0 and over, adult: Secondary | ICD-10-CM | POA: Diagnosis not present

## 2016-01-27 DIAGNOSIS — E876 Hypokalemia: Secondary | ICD-10-CM | POA: Diagnosis not present

## 2016-01-27 DIAGNOSIS — J4530 Mild persistent asthma, uncomplicated: Secondary | ICD-10-CM | POA: Diagnosis not present

## 2016-01-27 DIAGNOSIS — Z1212 Encounter for screening for malignant neoplasm of rectum: Secondary | ICD-10-CM | POA: Diagnosis not present

## 2016-01-27 DIAGNOSIS — E782 Mixed hyperlipidemia: Secondary | ICD-10-CM | POA: Diagnosis not present

## 2016-03-02 ENCOUNTER — Ambulatory Visit: Payer: Medicare Other | Admitting: Orthopedic Surgery

## 2016-03-13 ENCOUNTER — Ambulatory Visit (INDEPENDENT_AMBULATORY_CARE_PROVIDER_SITE_OTHER): Payer: Medicare Other

## 2016-03-13 ENCOUNTER — Ambulatory Visit (INDEPENDENT_AMBULATORY_CARE_PROVIDER_SITE_OTHER): Payer: Medicare Other | Admitting: Orthopedic Surgery

## 2016-03-13 DIAGNOSIS — M129 Arthropathy, unspecified: Secondary | ICD-10-CM

## 2016-03-13 DIAGNOSIS — M1710 Unilateral primary osteoarthritis, unspecified knee: Secondary | ICD-10-CM

## 2016-03-13 DIAGNOSIS — Z96652 Presence of left artificial knee joint: Secondary | ICD-10-CM

## 2016-03-13 NOTE — Progress Notes (Signed)
Patient ID: [name], female   DOB: [birth year], 82 y.o.   MRN: [id number]  Post op annual TKA   Chief Complaint  Patient presents with  . Follow-up    Yearly recheck on on left TKA, DOS 01-22-12.    HPI [name] is a 82 y.o. female.  Presents for one-year follow-up regarding her left total knee with no complaints   Past Medical History  Diagnosis Date  . Arthritis   . HTN (hypertension)   . Thyroid dysfunction   . Asthma   . Shortness of breath     exertion  . GERD (gastroesophageal reflux disease)   . Diverticulosis   . Anxiety   . Depression   . Cancer     breast  . Sleep apnea     STOP BANG 4    Past Surgical History  Procedure Laterality Date  . Tubal ligation    . Broken right ankle  1976  . Broken radius ulna  1990    left  . Thyroid surgery  1983  . Rt foot bone spur removed  1992  . Arthroscopic surgery rt knee  1997  . Left knee  2004  . Joint replacement  right knee 2005 [place]  . Breast surgery  2001    LEFT/ CANCER   . Abdominal hysterectomy    . Total knee arthroplasty  01/22/2012    Procedure: TOTAL KNEE ARTHROPLASTY;  Surgeon: Carole Civil, MD;  Location: [facility];  Service: Orthopedics;  Laterality: Left;     No Known Allergies  Current Outpatient Prescriptions  Medication Sig Dispense Refill  . atenolol (TENORMIN) 50 MG tablet Take 50 mg by mouth every evening.    . cetirizine (ZYRTEC) 10 MG tablet Take 10 mg by mouth at bedtime.     . cholecalciferol (VITAMIN D) 1000 UNITS tablet Take 2,000 Units by mouth every morning.    . gabapentin (NEURONTIN) 100 MG capsule Take 1 capsule (100 mg total) by mouth 3 (three) times daily. 90 capsule 2  . HYDROcodone-acetaminophen (NORCO) 5-325 MG per tablet Take 1 tablet by mouth every 4 (four) hours as needed. 60 tablet 5  . LORazepam (ATIVAN) 0.5 MG tablet Take 0.5 mg by mouth every 8 (eight) hours as needed. For anxiety    . meclizine (ANTIVERT) 12.5 MG tablet Take 12.5 mg by  mouth every morning.    . Multiple Vitamins-Minerals (CENTRUM PO) Take by mouth.    Marland Kitchen omeprazole (PRILOSEC) 20 MG capsule Take 20 mg by mouth 2 (two) times daily.    . potassium chloride SA (KLOR-CON M20) 20 MEQ tablet Take 20 mEq by mouth every morning.     . rivaroxaban (XARELTO) 10 MG TABS tablet Take 10 mg by mouth daily.    . sertraline (ZOLOFT) 100 MG tablet Take 100 mg by mouth every morning.    . valsartan-hydrochlorothiazide (DIOVAN HCT) 160-12.5 MG per tablet Take 1 tablet by mouth every morning.      No current facility-administered medications for this visit.    Review of Systems Review of Systems  Lower back pain Physical Exam Blood pressure 107/59, pulse 73, height 4\' 11"  (1.499 m), weight 180 lb (81.647 kg).   Gen. appearance is normal there are no congenital abnormalities   The patient is oriented 3   Mood and affect are normal   Ambulation is without assistive device   Knee inspection reveals a well-healed incision with no swelling   Knee flexion 110  Stability  in the anteroposterior plane is normal as well as in the medial lateral plane  Motor exam reveals full extension without extensor lag   Data Reviewed KNEE XRAYS : Radiology report  3 views left knee  AP lateral and patellar sunrise views of the knee  FINDINGS: A total knee prosthesis is noted. It is in anatomic alignment. There is no evidence of loosening.  Impression : normal TKA  xray   Assessment S/P TKA   Plan    Follow-up in a year x-ray left knee       Arther Abbott 03/13/2016, 10:00 AM

## 2016-03-15 DIAGNOSIS — R05 Cough: Secondary | ICD-10-CM | POA: Diagnosis not present

## 2016-03-15 DIAGNOSIS — Z6841 Body Mass Index (BMI) 40.0 and over, adult: Secondary | ICD-10-CM | POA: Diagnosis not present

## 2016-03-15 DIAGNOSIS — R0602 Shortness of breath: Secondary | ICD-10-CM | POA: Diagnosis not present

## 2016-03-21 DIAGNOSIS — I10 Essential (primary) hypertension: Secondary | ICD-10-CM | POA: Diagnosis not present

## 2016-03-21 DIAGNOSIS — R05 Cough: Secondary | ICD-10-CM | POA: Diagnosis not present

## 2016-03-21 DIAGNOSIS — M545 Low back pain: Secondary | ICD-10-CM | POA: Diagnosis not present

## 2016-03-21 DIAGNOSIS — Z6841 Body Mass Index (BMI) 40.0 and over, adult: Secondary | ICD-10-CM | POA: Diagnosis not present

## 2016-03-22 DIAGNOSIS — R195 Other fecal abnormalities: Secondary | ICD-10-CM | POA: Diagnosis not present

## 2016-04-11 DIAGNOSIS — J45909 Unspecified asthma, uncomplicated: Secondary | ICD-10-CM | POA: Diagnosis not present

## 2016-04-11 DIAGNOSIS — E785 Hyperlipidemia, unspecified: Secondary | ICD-10-CM | POA: Diagnosis not present

## 2016-04-11 DIAGNOSIS — R195 Other fecal abnormalities: Secondary | ICD-10-CM | POA: Diagnosis not present

## 2016-04-11 DIAGNOSIS — K219 Gastro-esophageal reflux disease without esophagitis: Secondary | ICD-10-CM | POA: Diagnosis not present

## 2016-04-11 DIAGNOSIS — K5730 Diverticulosis of large intestine without perforation or abscess without bleeding: Secondary | ICD-10-CM | POA: Diagnosis not present

## 2016-04-11 DIAGNOSIS — Z791 Long term (current) use of non-steroidal anti-inflammatories (NSAID): Secondary | ICD-10-CM | POA: Diagnosis not present

## 2016-04-11 DIAGNOSIS — Z853 Personal history of malignant neoplasm of breast: Secondary | ICD-10-CM | POA: Diagnosis not present

## 2016-04-11 DIAGNOSIS — F418 Other specified anxiety disorders: Secondary | ICD-10-CM | POA: Diagnosis not present

## 2016-04-11 DIAGNOSIS — K921 Melena: Secondary | ICD-10-CM | POA: Diagnosis not present

## 2016-04-11 DIAGNOSIS — M069 Rheumatoid arthritis, unspecified: Secondary | ICD-10-CM | POA: Diagnosis not present

## 2016-04-11 DIAGNOSIS — Z79899 Other long term (current) drug therapy: Secondary | ICD-10-CM | POA: Diagnosis not present

## 2016-04-11 DIAGNOSIS — I10 Essential (primary) hypertension: Secondary | ICD-10-CM | POA: Diagnosis not present

## 2016-04-24 DIAGNOSIS — J301 Allergic rhinitis due to pollen: Secondary | ICD-10-CM | POA: Diagnosis not present

## 2016-04-24 DIAGNOSIS — Z6841 Body Mass Index (BMI) 40.0 and over, adult: Secondary | ICD-10-CM | POA: Diagnosis not present

## 2016-04-24 DIAGNOSIS — R05 Cough: Secondary | ICD-10-CM | POA: Diagnosis not present

## 2016-05-02 DIAGNOSIS — Z6841 Body Mass Index (BMI) 40.0 and over, adult: Secondary | ICD-10-CM | POA: Diagnosis not present

## 2016-05-02 DIAGNOSIS — I10 Essential (primary) hypertension: Secondary | ICD-10-CM | POA: Diagnosis not present

## 2016-05-02 DIAGNOSIS — E876 Hypokalemia: Secondary | ICD-10-CM | POA: Diagnosis not present

## 2016-05-02 DIAGNOSIS — E782 Mixed hyperlipidemia: Secondary | ICD-10-CM | POA: Diagnosis not present

## 2016-05-02 DIAGNOSIS — Z0001 Encounter for general adult medical examination with abnormal findings: Secondary | ICD-10-CM | POA: Diagnosis not present

## 2016-05-02 DIAGNOSIS — E6609 Other obesity due to excess calories: Secondary | ICD-10-CM | POA: Diagnosis not present

## 2016-05-02 DIAGNOSIS — F331 Major depressive disorder, recurrent, moderate: Secondary | ICD-10-CM | POA: Diagnosis not present

## 2016-05-02 DIAGNOSIS — J301 Allergic rhinitis due to pollen: Secondary | ICD-10-CM | POA: Diagnosis not present

## 2016-06-06 ENCOUNTER — Ambulatory Visit (INDEPENDENT_AMBULATORY_CARE_PROVIDER_SITE_OTHER): Payer: Medicare Other | Admitting: Allergy & Immunology

## 2016-06-06 ENCOUNTER — Encounter (INDEPENDENT_AMBULATORY_CARE_PROVIDER_SITE_OTHER): Payer: Self-pay

## 2016-06-06 ENCOUNTER — Telehealth: Payer: Self-pay | Admitting: Allergy & Immunology

## 2016-06-06 ENCOUNTER — Encounter: Payer: Self-pay | Admitting: Allergy & Immunology

## 2016-06-06 VITALS — BP 130/78 | HR 70 | Temp 97.8°F | Resp 16 | Ht <= 58 in | Wt 193.4 lb

## 2016-06-06 DIAGNOSIS — J4550 Severe persistent asthma, uncomplicated: Secondary | ICD-10-CM | POA: Diagnosis not present

## 2016-06-06 DIAGNOSIS — J310 Chronic rhinitis: Secondary | ICD-10-CM | POA: Diagnosis not present

## 2016-06-06 DIAGNOSIS — R05 Cough: Secondary | ICD-10-CM

## 2016-06-06 DIAGNOSIS — R059 Cough, unspecified: Secondary | ICD-10-CM

## 2016-06-06 MED ORDER — BUDESONIDE-FORMOTEROL FUMARATE 160-4.5 MCG/ACT IN AERO: 2.0000 | INHALATION_SPRAY | Freq: Two times a day (BID) | RESPIRATORY_TRACT | 5 refills | Status: DC

## 2016-06-06 MED ORDER — FLUTICASONE PROPIONATE 50 MCG/ACT NA SUSP: 2.0000 | Freq: Every day | NASAL | 5 refills | Status: DC

## 2016-06-06 NOTE — Telephone Encounter (Signed)
Patient saw Dr. Ernst Bowler 06/06/16, today, in [place]. She said he called her in some medication and when she picked it up, she doesn't understand what it is. Would like a nurse to call her back.

## 2016-06-06 NOTE — Telephone Encounter (Signed)
Spoke with pt she was concerned because on her take home sheet their was medications she isnt taken and she wanted to know if she should take them I stated only take what your drs have prescribed.

## 2016-06-06 NOTE — Progress Notes (Signed)
NEW PATIENT  Date of Service/Encounter:  06/06/16   Assessment:   Cough - Plan: Spirometry with Graph, Allergen Zone 3  Chronic rhinitis - Plan: Allergen Zone 3  Severe persistent asthma, uncomplicated   Asthma Reportables:  Severity: : severe persistent  Risk: low Control: not well controlled  Seasonal Influenza Vaccine: yes   The CDC recommends patients with persistent asthma between the ages of 19-64 years receive PPSV-23 vaccination, if this patient qualifies, has he/she received 1 dose of PPSV-23 yet? Yes    Plan/Recommendations:    1. Cough - We did hear wheezing on exam today, so there is likely a component of asthma. - Lung testing did show improvement with the nebulizer treatment, which also points to asthma. - Start Symbicort 160/4.5 two puffs in the morning and two puffs at night. - Use a spacer to take this medication so that it gets deeper into your lungs. - I agree with the Pulmonology referral.  2. Chronic rhinitis with a component of rhinitis medicamentosa - We were unable to perform skin testing today because she in on a beta blocker, which can block the activity of epinephrine should she have anaphylaxis to testing.  - We will send blood testing to look for allergies. - Start Flonase two sprays per nostril daily.  - She should try to wean off oxymetazoline since this can lead to worsening nasal congestion.  - I recommended using the oxymetazoline every other day for one month and then stopping.  3. Return in about 2 months (around 08/06/2016).    Subjective:   [name] is a 82 y.o. female presenting today for evaluation of  Chief Complaint  Patient presents with  . Cough    since jan.  [name] has a history of the following: Patient Active Problem List   Diagnosis Date Noted  . S/P total knee replacement 01/28/2013  . OA (osteoarthritis) of knee 10/31/2011  . S/P knee replacement 05/18/2011  . OSTEOARTHRITIS,  KNEE, LEFT 05/11/2010  . [place] DISEASE, LUMBAR SPINE 10/19/2008  . SCIATICA 07/13/2008  . KNEE PAIN 05/28/2008  . LOW BACK PAIN 05/28/2008  . SCOLIOSIS-IDIOPATHIC 05/28/2008  . Knee joint replacement by other means 05/28/2008    History obtained from: chart review and patient and daughter.  [name] was referred by Gar Ponto, MD.     [name] is a 82 y.o. female presenting for chronic cough. [name] reports that this started in January. She does have some mucous productive that is clear and sometimes white. At that time, her blood pressure medication was changed due to insurance coverage. She was on losartan but then started coughing. Prior to the start of losartan, she was on lisinopril for several years without any problems. The losartan stopped in February but the coughing continued. When she first started complaining about the cough, she was started on doxycycline which was given for six days. This did not seem to help. She was on an oral steroid at some point during the last nine months which did not help. She was on a couple of other courses of antibiotics without improvement. She did get a CXR at some point, which she says was normal; unfortunately we are unable to see this today. There was discussion about sending her to a pulmonologist as well.   The coughing has been persistent since it started. Sometimes it seems to become exacerbated by colds. She does endorse an itchy watery nose and drainage. Hard candy  seems to help with the coughing. She was also started on cetirizine which she has taken for years. She is unsure how long she has been taking this. She has been on a nose spray (oxmetazoline) which she uses every night mostly due a ritual rather than actually needing it. She does not have any nosebleeds from it. Typically she only needs it during the day. It does not seem that she has been on Flonase or other nasal steroids. She does have an albuterol inhaler for  years which she has used intermittently over several years but she has never been on anything that she takes on a daily basis.   [name] does not have hives. There are no concerns with food allergies. She did have a bit of seasonal allergies when she was younger but she has never been allergy tested before. Otherwise, there is no history of other atopic diseases, including drug allergies, food allergies, stinging insect allergies, or urticaria. There is no significant infectious history. Vaccinations are up to date.    Past Medical History: Patient Active Problem List   Diagnosis Date Noted  . S/P total knee replacement 01/28/2013  . OA (osteoarthritis) of knee 10/31/2011  . S/P knee replacement 05/18/2011  . OSTEOARTHRITIS, KNEE, LEFT 05/11/2010  . [place] DISEASE, LUMBAR SPINE 10/19/2008  . SCIATICA 07/13/2008  . KNEE PAIN 05/28/2008  . LOW BACK PAIN 05/28/2008  . SCOLIOSIS-IDIOPATHIC 05/28/2008  . Knee joint replacement by other means 05/28/2008    Medication List:    Medication List       Accurate as of 06/06/16 11:03 AM. Always use your most recent med list.          albuterol 108 (90 Base) MCG/ACT inhaler Commonly known as:  PROVENTIL HFA;VENTOLIN HFA Inhale 2 puffs into the lungs every 6 (six) hours as needed for wheezing or shortness of breath.   atenolol 50 MG tablet Commonly known as:  TENORMIN Take 50 mg by mouth every evening.   CENTRUM PO Take by mouth.   cetirizine 10 MG tablet Commonly known as:  ZYRTEC Take 10 mg by mouth at bedtime.   chlorthalidone 25 MG tablet Commonly known as:  HYGROTON   cholecalciferol 1000 units tablet Commonly known as:  VITAMIN D Take 2,000 Units by mouth every morning.   DIOVAN HCT 160-12.5 MG tablet Generic drug:  valsartan-hydrochlorothiazide Take 1 tablet by mouth every morning.   gabapentin 100 MG capsule Commonly known as:  NEURONTIN Take 1 capsule (100 mg total) by mouth 3 (three) times daily.     GAVILYTE-N WITH FLAVOR PACK 420 g solution Generic drug:  polyethylene glycol-electrolytes   HYDROcodone-acetaminophen 5-325 MG tablet Commonly known as:  NORCO/VICODIN Take 1 tablet by mouth every 4 (four) hours as needed.   KLOR-CON M20 20 MEQ tablet Generic drug:  potassium chloride SA Take 20 mEq by mouth every morning.   LORazepam 0.5 MG tablet Commonly known as:  ATIVAN Take 0.5 mg by mouth every 8 (eight) hours as needed. For anxiety   meclizine 12.5 MG tablet Commonly known as:  ANTIVERT Take 12.5 mg by mouth every morning.   montelukast 10 MG tablet Commonly known as:  SINGULAIR   omeprazole 20 MG capsule Commonly known as:  PRILOSEC Take 20 mg by mouth 2 (two) times daily.   ranitidine 150 MG tablet Commonly known as:  ZANTAC   sertraline 100 MG tablet Commonly known as:  ZOLOFT Take 100 mg by mouth every morning.   XARELTO 10 MG  Tabs tablet Generic drug:  rivaroxaban Take 10 mg by mouth daily.       Birth History: non-contributory  Developmental History: [name] has met all milestones on time.   Past Surgical History: Past Surgical History:  Procedure Laterality Date  . ABDOMINAL HYSTERECTOMY    . arthroscopic surgery rt knee  1997  . BREAST SURGERY  2001   LEFT/ CANCER   . broken radius ulna  1990   left  . broken right ankle  1976  . JOINT REPLACEMENT  right knee 2005 [place]  . left knee  2004  . rt foot bone spur removed  1992  . THYROID SURGERY  1983  . TOTAL KNEE ARTHROPLASTY  01/22/2012   Procedure: TOTAL KNEE ARTHROPLASTY;  Surgeon: Carole Civil, MD;  Location: [facility];  Service: Orthopedics;  Laterality: Left;  . TUBAL LIGATION       Family History: Family History  Problem Relation Age of Onset  . Pseudochol deficiency Neg Hx   . Malignant hyperthermia Neg Hx   . Hypotension Neg Hx   . Anesthesia problems Neg Hx      Social History: [name] lives at home by herself. There is hardwood throughout with some probably  mold present. There is a cat outside of the home. There are no dust mite covers. There is no tobacco exposure at all currently but she did smoke 30 years ago or so. 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    Review of Systems: a 14-point review of systems is pertinent for what is mentioned in HPI.  Otherwise, all other systems were negative. Constitutional: negative other than that listed in the HPI Eyes: negative other than that listed in the HPI Ears, nose, mouth, throat, and face: negative other than that listed in the HPI Respiratory: negative other than that listed in the HPI Cardiovascular: negative other than that listed in the HPI Gastrointestinal: negative other than that listed in the HPI Genitourinary: negative other than that listed in the HPI Integument: negative other than that listed in the HPI Hematologic: negative other than that listed in the HPI Musculoskeletal: negative other than that listed in the HPI Neurological: negative other than that listed in the HPI Allergy/Immunologic: negative other than that listed in the HPI    Objective:   Blood pressure 130/78, pulse 70, temperature 97.8 F (36.6 C), temperature source Oral, resp. rate 16, height 4' 8.69" (1.44 m), weight 193 lb 6.4 oz (87.7 kg), SpO2 96 %. Body mass index is 42.31 kg/m.   Physical Exam:  General: Alert, interactive, in no acute distress. Cooperative with the exam. HEENT: TMs pearly gray, turbinates edematous with clear discharge, post-pharynx mildly erythematous. Neck: Supple without thyromegaly. Adenopathy: no enlarged lymph nodes appreciated in the anterior cervical, occipital, axillary, epitrochlear, inguinal, or popliteal regions Lungs: Decreased breath sounds with expiratory wheezing bilaterally. No increased work of breathing. CV: Normal S1, S2 without murmurs. Capillary refill <2 seconds.  Abdomen:  Nondistended, nontender. Skin: Warm and dry, without lesions or rashes. Extremities:  No clubbing, cyanosis or edema. Neuro:   Grossly intact.  Diagnostic studies:  Spirometry: results abnormal (FEV1: 0.78/77%, FVC: 0.90/67%, FEV1/FVC: 86%).    Spirometry consistent with possible restrictive disease. A DuoNeb nebulizer treatment was provided in clinic with marked improvement in the FVC and FEV1 of 51% and 35%, respectively. FEV1/FVC ratio remained normal. Improvement was significant per ATS  criteria.   Allergy Studies: None    Salvatore Marvel, MD [facility]

## 2016-06-06 NOTE — Addendum Note (Signed)
Addended by: York Grice on: 06/06/2016 11:19 AM   Modules accepted: Orders

## 2016-06-06 NOTE — Patient Instructions (Signed)
1. Cough - We did hear wheezing on exam today, so there is likely a component of asthma. - Lung testing did show improvement with the nebulizer treatment, which also points to asthma. - Start Symbicort 160/4.5 two puffs in the morning and two puffs at night. - Use a spacer to take this medication so that it gets deeper into your lungs. - I agree with the Pulmonology referral.  2. Chronic rhinitis - We cannot do skin testing today because you are on a beta blocker (atenolol). - We will send blood testing to look for allergies. - Start Flonase two sprays per nostril daily.  - You should try to wean off of your nasal spray (oxymetazoline) since this can lead to worsening nasal congestion.  - Try using the oxymetazoline every other day for one month and then stopping. - You can then use it as needed for nasal congestion, but do not use for longer than 3-5 days at a time.   3. Return in about 2 months (around 08/06/2016).  Please inform us of any Emergency Department visits, hospitalizations, or changes in symptoms. Call us before going to the ED for breathing or allergy symptoms since we might be able to fit you in for a sick visit. Feel free to contact us anytime with any questions, problems, or concerns.  It was a pleasure to meet you and your family today!

## 2016-06-07 LAB — CP584 ZONE 3
Allergen, Black Locust, Acacia9: <0.1 kU/L
Allergen, Cedar tree, t12: <0.1 kU/L
Allergen, Comm Silver Birch, t9: <0.1 kU/L
Allergen, D pternoyssinus,d7: <0.1 kU/L
Allergen, Oak,t7: <0.1 kU/L
Bermuda Grass: <0.1 kU/L
Box Elder IgE: <0.1 kU/L
Cat Dander: <0.1 kU/L
Cockroach: <0.1 kU/L
D. farinae: <0.1 kU/L
Dog Dander: <0.1 kU/L
Nettle: <0.1 kU/L

## 2016-06-13 DIAGNOSIS — S3992XA Unspecified injury of lower back, initial encounter: Secondary | ICD-10-CM | POA: Diagnosis not present

## 2016-06-13 DIAGNOSIS — Z79899 Other long term (current) drug therapy: Secondary | ICD-10-CM | POA: Diagnosis not present

## 2016-06-13 DIAGNOSIS — Z853 Personal history of malignant neoplasm of breast: Secondary | ICD-10-CM | POA: Diagnosis not present

## 2016-06-13 DIAGNOSIS — M810 Age-related osteoporosis without current pathological fracture: Secondary | ICD-10-CM | POA: Diagnosis not present

## 2016-06-13 DIAGNOSIS — S52572A Other intraarticular fracture of lower end of left radius, initial encounter for closed fracture: Secondary | ICD-10-CM | POA: Diagnosis not present

## 2016-06-13 DIAGNOSIS — M25559 Pain in unspecified hip: Secondary | ICD-10-CM | POA: Diagnosis not present

## 2016-06-13 DIAGNOSIS — I10 Essential (primary) hypertension: Secondary | ICD-10-CM | POA: Diagnosis not present

## 2016-06-13 DIAGNOSIS — J45909 Unspecified asthma, uncomplicated: Secondary | ICD-10-CM | POA: Diagnosis not present

## 2016-06-13 DIAGNOSIS — H353110 Nonexudative age-related macular degeneration, right eye, stage unspecified: Secondary | ICD-10-CM | POA: Diagnosis not present

## 2016-06-13 DIAGNOSIS — S52502A Unspecified fracture of the lower end of left radius, initial encounter for closed fracture: Secondary | ICD-10-CM | POA: Diagnosis not present

## 2016-06-13 DIAGNOSIS — F329 Major depressive disorder, single episode, unspecified: Secondary | ICD-10-CM | POA: Diagnosis not present

## 2016-06-13 DIAGNOSIS — W010XXA Fall on same level from slipping, tripping and stumbling without subsequent striking against object, initial encounter: Secondary | ICD-10-CM | POA: Diagnosis not present

## 2016-06-13 DIAGNOSIS — S3993XA Unspecified injury of pelvis, initial encounter: Secondary | ICD-10-CM | POA: Diagnosis not present

## 2016-06-13 DIAGNOSIS — M25539 Pain in unspecified wrist: Secondary | ICD-10-CM | POA: Diagnosis not present

## 2016-06-13 DIAGNOSIS — M545 Low back pain: Secondary | ICD-10-CM | POA: Diagnosis not present

## 2016-06-13 DIAGNOSIS — T148 Other injury of unspecified body region: Secondary | ICD-10-CM | POA: Diagnosis not present

## 2016-06-15 DIAGNOSIS — S52352A Displaced comminuted fracture of shaft of radius, left arm, initial encounter for closed fracture: Secondary | ICD-10-CM | POA: Diagnosis not present

## 2016-06-15 DIAGNOSIS — S52502A Unspecified fracture of the lower end of left radius, initial encounter for closed fracture: Secondary | ICD-10-CM | POA: Diagnosis not present

## 2016-06-21 DIAGNOSIS — S52352D Displaced comminuted fracture of shaft of radius, left arm, subsequent encounter for closed fracture with routine healing: Secondary | ICD-10-CM | POA: Diagnosis not present

## 2016-06-28 ENCOUNTER — Inpatient Hospital Stay (INDEPENDENT_AMBULATORY_CARE_PROVIDER_SITE_OTHER): Payer: Medicare Other | Admitting: Orthopaedic Surgery

## 2016-06-28 DIAGNOSIS — S52352D Displaced comminuted fracture of shaft of radius, left arm, subsequent encounter for closed fracture with routine healing: Secondary | ICD-10-CM

## 2016-07-05 ENCOUNTER — Ambulatory Visit (INDEPENDENT_AMBULATORY_CARE_PROVIDER_SITE_OTHER): Payer: Medicare Other | Admitting: Orthopaedic Surgery

## 2016-07-05 DIAGNOSIS — S52352S Displaced comminuted fracture of shaft of radius, left arm, sequela: Secondary | ICD-10-CM

## 2016-07-26 ENCOUNTER — Ambulatory Visit (INDEPENDENT_AMBULATORY_CARE_PROVIDER_SITE_OTHER): Payer: Medicare Other

## 2016-07-26 ENCOUNTER — Encounter (INDEPENDENT_AMBULATORY_CARE_PROVIDER_SITE_OTHER): Payer: Self-pay | Admitting: Orthopedic Surgery

## 2016-07-26 ENCOUNTER — Ambulatory Visit (INDEPENDENT_AMBULATORY_CARE_PROVIDER_SITE_OTHER): Payer: Medicare Other | Admitting: Orthopedic Surgery

## 2016-07-26 DIAGNOSIS — S52532D Colles' fracture of left radius, subsequent encounter for closed fracture with routine healing: Secondary | ICD-10-CM

## 2016-07-26 DIAGNOSIS — S52502A Unspecified fracture of the lower end of left radius, initial encounter for closed fracture: Secondary | ICD-10-CM | POA: Insufficient documentation

## 2016-07-26 NOTE — Progress Notes (Signed)
Office Visit Note   Patient: [name]           Date of Birth: 07-03-1930           MRN: [id number] Visit Date: 07/26/2016              Requested by: Caryl Bis, MD [place], [place] [address] PCP: Gar Ponto, MD   Assessment & Plan: Visit Diagnoses:  1. Closed Colles' fracture of left radius with routine healing, subsequent encounter     Plan: Need to use her splint intermittently if she goes outside. Continue working on her range of motion. Follow back up in 3-4 weeks for recheck x-ray and reevaluation.  Follow-Up Instructions: Return in about 3 weeks (around 08/16/2016).   Orders:  Orders Placed This Encounter  Procedures  . XR Wrist Complete Left   No orders of the defined types were placed in this encounter.     Procedures: No procedures performed   Clinical Data: No additional findings.   Subjective: Chief Complaint  Patient presents with  . Left Wrist - Routine Post Op    Pt is seeing an allergist for her persistent cough Pt uses a walker some    Review of Systems  HENT: Negative.   Eyes: Negative.   Respiratory: Positive for cough.   Cardiovascular: Negative.   Gastrointestinal: Negative.   Endocrine: Negative.   Genitourinary: Negative.   Musculoskeletal: Positive for back pain.  Allergic/Immunologic: Negative.   Neurological: Negative.   Hematological: Negative.   Psychiatric/Behavioral: Negative.      Objective: Vital Signs: BP 100/63   Pulse 67   Ht 4\' 9"  (1.448 m)   Wt 190 lb (86.2 kg)   BMI 41.12 kg/m   Physical Exam  Constitutional: She is oriented to person, place, and time. She appears well-developed and well-nourished.  HENT:  Head: Normocephalic.  Eyes: EOM are normal. Pupils are equal, round, and reactive to light.  Cardiovascular: Normal rate.   Pulmonary/Chest: Effort normal.  Neurological: She is alert and oriented to person, place, and time.  Skin: Skin is warm and dry.  Psychiatric: She  has a normal mood and affect. Her behavior is normal. Judgment and thought content normal.    Left Hand Exam   Range of Motion   Wrist  Extension: 30  Flexion: 30  Pronation: 70  Supination: 70   Muscle Strength  Wrist Extension: 3/5  Wrist Flexion: 3/5  Grip:  3/5   Other  Sensation: normal Pulse: present      Specialty Comments:  No specialty comments available.  Imaging: Xr Wrist Complete Left  Result Date: 07/26/2016 Three-view x-ray of the left wrist does reveal hardware to be in position. It appears that she may have some settling of the radial styloid.    PMFS History: Patient Active Problem List   Diagnosis Date Noted  . Fracture of left distal radius 07/26/2016  . S/P total knee replacement 01/28/2013  . OA (osteoarthritis) of knee 10/31/2011  . S/P knee replacement 05/18/2011  . OSTEOARTHRITIS, KNEE, LEFT 05/11/2010  . [place] DISEASE, LUMBAR SPINE 10/19/2008  . SCIATICA 07/13/2008  . KNEE PAIN 05/28/2008  . LOW BACK PAIN 05/28/2008  . SCOLIOSIS-IDIOPATHIC 05/28/2008  . Knee joint replacement by other means 05/28/2008   Past Medical History:  Diagnosis Date  . Anxiety   . Arthritis   . Asthma   . Cancer ([place])    breast  . Depression   . Diverticulosis   .  GERD (gastroesophageal reflux disease)   . HTN (hypertension)   . Shortness of breath    exertion  . Sleep apnea    STOP BANG 4  . Thyroid dysfunction     Family History  Problem Relation Age of Onset  . Pseudochol deficiency Neg Hx   . Malignant hyperthermia Neg Hx   . Hypotension Neg Hx   . Anesthesia problems Neg Hx     Past Surgical History:  Procedure Laterality Date  . ABDOMINAL HYSTERECTOMY    . arthroscopic surgery rt knee  1997  . BREAST SURGERY  2001   LEFT/ CANCER   . broken radius ulna  1990   left  . broken right ankle  1976  . JOINT REPLACEMENT  right knee 2005 [facility]  . left knee  2004  . rt foot bone spur removed  1992  . THYROID SURGERY   1983  . TOTAL KNEE ARTHROPLASTY  01/22/2012   Procedure: TOTAL KNEE ARTHROPLASTY;  Surgeon: Carole Civil, MD;  Location: [facility];  Service: Orthopedics;  Laterality: Left;  . TUBAL LIGATION     Social History   Occupational History  . Not on file.   Social History Main Topics  . Smoking status: Former Research scientist (life sciences)  . Smokeless tobacco: Former Systems developer  . Alcohol use No  . Drug use: No  . Sexual activity: Not on file

## 2016-08-08 ENCOUNTER — Encounter (INDEPENDENT_AMBULATORY_CARE_PROVIDER_SITE_OTHER): Payer: Self-pay

## 2016-08-08 ENCOUNTER — Encounter: Payer: Self-pay | Admitting: Allergy & Immunology

## 2016-08-08 ENCOUNTER — Ambulatory Visit (INDEPENDENT_AMBULATORY_CARE_PROVIDER_SITE_OTHER): Payer: Medicare Other | Admitting: Allergy & Immunology

## 2016-08-08 VITALS — HR 79 | Temp 97.9°F

## 2016-08-08 DIAGNOSIS — J300 Vasomotor rhinitis: Secondary | ICD-10-CM

## 2016-08-08 DIAGNOSIS — J4540 Moderate persistent asthma, uncomplicated: Secondary | ICD-10-CM | POA: Diagnosis not present

## 2016-08-08 MED ORDER — ALBUTEROL SULFATE 108 (90 BASE) MCG/ACT IN AEPB: 4.0000 | INHALATION_SPRAY | RESPIRATORY_TRACT | 1 refills | Status: DC | PRN

## 2016-08-08 MED ORDER — BENZONATATE 200 MG PO CAPS: 200.0000 mg | ORAL_CAPSULE | Freq: Three times a day (TID) | ORAL | 1 refills | Status: DC | PRN

## 2016-08-08 MED ORDER — FLUTICASONE FUROATE-VILANTEROL 200-25 MCG/INH IN AEPB
1.0000 | INHALATION_SPRAY | Freq: Every day | RESPIRATORY_TRACT | 5 refills | Status: AC
Start: 2016-08-08 — End: 2016-09-07

## 2016-08-08 MED ORDER — FLUTICASONE PROPIONATE 50 MCG/ACT NA SUSP: 2.0000 | Freq: Every day | NASAL | 5 refills | Status: DC

## 2016-08-08 NOTE — Patient Instructions (Addendum)
1. Moderate persistent asthma, uncomplicated - Daily controller medication(s): STOP Symbicort and START Breo 200/25 one puff once daily - Rescue medications: ProAir 4 puffs every 4-6 hours as needed or albuterol nebulizer one vial puffs every 4-6 hours as needed - Asthma control goals:  * Full participation in all desired activities (may need albuterol before activity) * Albuterol use two time or less a week on average (not counting use with activity) * Cough interfering with sleep two time or less a month * Oral steroids no more than once a year * No hospitalizations  2. Chronic non-allergic vasomotor rhinitis - Continue with Flonase two sprays per nostril daily.  3. Return in about 2 months (around 10/08/2016).  Please inform us of any Emergency Department visits, hospitalizations, or changes in symptoms. Call us before going to the ED for breathing or allergy symptoms since we might be able to fit you in for a sick visit. Feel free to contact us anytime with any questions, problems, or concerns.  It was a pleasure to see you again today!   Websites that have reliable patient information: 1. American Academy of Asthma, Allergy, and Immunology: www.aaaai.org 2. Food Allergy Research and Education (FARE): foodallergy.org 3. Mothers of Asthmatics: http://www.asthmacommunitynetwork.org 4. American College of Allergy, Asthma, and Immunology: www.acaai.org

## 2016-08-08 NOTE — Progress Notes (Signed)
FOLLOW UP  Date of Service/Encounter:  08/08/16   Assessment:   Moderate persistent asthma, uncomplicated  Chronic vasomotor rhinitis   Adverse living situation (lives by herself)   Asthma Reportables:  Severity: moderate persistent  Risk: high due to lack of understanding of medications as well as her living situation Control: not well controlled  Seasonal Influenza Vaccine: yes    Plan/Recommendations:   1. Moderate persistent asthma, uncomplicated - Because the patient is having problems depressing the Symbicort to administer the medication, we will switch to a dry powder inhaler mechanism instead. - Daily controller medication(s): STOP Symbicort and START Breo 200/25 one puff once daily - Rescue medications: ProAir RespiClick 4 puffs every 4-6 hours as needed or albuterol nebulizer one vial puffs every 4-6 hours as needed - Asthma control goals:  * Full participation in all desired activities (may need albuterol before activity) * Albuterol use two time or less a week on average (not counting use with activity) * Cough interfering with sleep two time or less a month * Oral steroids no more than once a year * No hospitalizations - If there is no improvement with the above changes, I would strongly consider sending her to see a pulmonologist.  2. Chronic non-allergic vasomotor rhinitis - Continue with Flonase two sprays per nostril daily. - Could consider addition of a nasal antihistamine, which can provide relief even in the absence of confirmed IgE allergic sensitization. - Testing has been negative in the past.   3. Chronic cough - possible COPD - We will send in a prescription for tessalon pearls to see if this helps with her cough. - [name] does have a long history of smoking, therefore this could just be COPD that is resulting in the persistent mucous production.  - She does have a PPI already, therefore GERD should be well-controlled.  4. Return in  about 2 months (around 10/08/2016).    Subjective:   [name] is a 82 y.o. female presenting today for follow up of  Chief Complaint  Patient presents with  . Follow-up    C/O cough  .  [name] has a history of the following: Patient Active Problem List   Diagnosis Date Noted  . Fracture of left distal radius 07/26/2016  . S/P total knee replacement 01/28/2013  . OA (osteoarthritis) of knee 10/31/2011  . S/P knee replacement 05/18/2011  . OSTEOARTHRITIS, KNEE, LEFT 05/11/2010  . [place] DISEASE, LUMBAR SPINE 10/19/2008  . SCIATICA 07/13/2008  . KNEE PAIN 05/28/2008  . LOW BACK PAIN 05/28/2008  . SCOLIOSIS-IDIOPATHIC 05/28/2008  . Knee joint replacement by other means 05/28/2008    History obtained from: chart review and patient and her daughter.  [name] was referred by Gar Ponto, MD.     [name] is a 82 y.o. female presenting for a follow up visit. She was last seen in in September 2017 at which time we diagnosed her with asthma (reversible spirometric findings) and started her on Symbicort 160/4.5 two puffs twice daily. We did not perform skin testing secondary to her being on a beta blocker, however we did send blood work which showed no evidence of allergies. We recommended starting Flonase two sprays per nostril daily. She was using oxymetazoline nasal spray daily so I recommended weaning off over the course of a couple of months and stopping.  Since the last visit, she did well initially after starting the Symbicort. However, she had a wrist injury in September  2017 necessitating a wrist surgery. She has been recovering fairly well since then. However, the injury was on her left hand which is her dominant hand. She has had trouble compressing the Symbicort to administer the medication. Therefore, she is unsure how well she is doing with receiving the medication. Initially, the Symbicort seemed to help her cough. The cough has  returned since having the the wrist surgery. Of note, she never did see a pulmonologist.  She does intermittently have problems with nasal congestion. She endorses daily white mucus production from a chronic cough. She does have a history of smoking from her teens through age 54 or so. She estimates this was about 1-1/2 packs per week, although this varied depending on her activities.  Otherwise, there have been no changes to her past medical history, surgical history, family history, or social history.    Review of Systems: a 14-point review of systems is pertinent for what is mentioned in HPI.  Otherwise, all other systems were negative. Constitutional: negative other than that listed in the HPI Eyes: negative other than that listed in the HPI Ears, nose, mouth, throat, and face: negative other than that listed in the HPI Respiratory: negative other than that listed in the HPI Cardiovascular: negative other than that listed in the HPI Gastrointestinal: negative other than that listed in the HPI Genitourinary: negative other than that listed in the HPI Integument: negative other than that listed in the HPI Hematologic: negative other than that listed in the HPI Musculoskeletal: negative other than that listed in the HPI Neurological: negative other than that listed in the HPI Allergy/Immunologic: negative other than that listed in the HPI    Objective:   Pulse 79, temperature 97.9 F (36.6 C), temperature source Oral, SpO2 95 %. There is no height or weight on file to calculate BMI.   Physical Exam:  General: Alert, interactive, in no acute distress. Very pleasant. Cooperative with the exam HEENT: TMs pearly gray, turbinates edematous without discharge, post-pharynx mildly erythematous. Neck: Supple without thyromegaly. Lungs: Decreased breath sounds bilaterally without wheezing, rhonchi or rales. No increased work of breathing. CV: Normal S1/S2, no murmurs. Capillary refill <2  seconds.  Abdomen: Nondistended, nontender. No guarding or rebound tenderness. Bowel sounds present in all fields and hypoactive  Skin: Warm and dry, without lesions or rashes. Extremities:  No clubbing, cyanosis or edema. Neuro:   Grossly intact. No deficits. Some problems remembering which medications she is taking.   Diagnostic studies:  Spirometry: results abnormal (FEV1: 0.81/82%, FVC: 0.91/70%, FEV1/FVC: 88%).    Spirometry consistent with possible restrictive disease possibly secondary to her body habitus.  Allergy Studies: None   Salvatore Marvel, MD [facility]

## 2016-08-16 ENCOUNTER — Ambulatory Visit (INDEPENDENT_AMBULATORY_CARE_PROVIDER_SITE_OTHER): Payer: Medicare Other | Admitting: Orthopaedic Surgery

## 2016-08-16 ENCOUNTER — Encounter (INDEPENDENT_AMBULATORY_CARE_PROVIDER_SITE_OTHER): Payer: Self-pay | Admitting: Orthopaedic Surgery

## 2016-08-16 VITALS — BP 105/50 | HR 69 | Resp 14 | Ht <= 58 in | Wt 190.0 lb

## 2016-08-16 DIAGNOSIS — M25532 Pain in left wrist: Secondary | ICD-10-CM

## 2016-08-16 NOTE — Progress Notes (Signed)
Office Visit Note   Patient: [name]           Date of Birth: [birth year]           MRN: [id number] Visit Date: 08/16/2016              Requested by: Cheryl Bis, MD [place], [place] [id number] PCP: Cheryl Ponto, MD   Assessment & Plan: Visit Diagnoses:  1. Pain in left wrist     Plan: [name] 2 months status post ORIF left wrist fracture and doing quite well. She is accompanied by her son who notes that she uses a walker because of her balance but, otherwise, is not having any problems with her wrist. She seems to be doing so very well in terms of range of motion and function so I will not need to see her back and less there is a problem. I did not think films were necessary today as she is doing so well without any limitations.  Follow-Up Instructions: Return if symptoms worsen or fail to improve.   Orders:  No orders of the defined types were placed in this encounter.  No orders of the defined types were placed in this encounter.     Procedures: No procedures performed   Clinical Data: No additional findings.   Subjective: Chief Complaint  Patient presents with  . Left Wrist - Fracture, Routine Post Op    HPI  Review of Systems   Objective: Vital Signs: BP (!) 105/50   Pulse 69   Resp 14   Ht 4\' 9"  (1.448 m)   Wt 190 lb (86.2 kg)   BMI 41.12 kg/m   Physical Exam  Ortho Exam left wrist exam reveals radial deviation but without any pain. She has full pronation and supination. The fingers are not swollen. Neurologically the hand is intact. He has very slight loss of extension and flexion but no functional loss. Specialty Comments:  No specialty comments available.  Imaging: No results found.   PMFS History: Patient Active Problem List   Diagnosis Date Noted  . Fracture of left distal radius 07/26/2016  . S/P total knee replacement 01/28/2013  . OA (osteoarthritis) of knee 10/31/2011  . S/P knee replacement 05/18/2011  .  OSTEOARTHRITIS, KNEE, LEFT 05/11/2010  . [place] DISEASE, LUMBAR SPINE 10/19/2008  . SCIATICA 07/13/2008  . KNEE PAIN 05/28/2008  . LOW BACK PAIN 05/28/2008  . SCOLIOSIS-IDIOPATHIC 05/28/2008  . Knee joint replacement by other means 05/28/2008   Past Medical History:  Diagnosis Date  . Anxiety   . Arthritis   . Asthma   . Cancer ([place])    breast  . Depression   . Diverticulosis   . GERD (gastroesophageal reflux disease)   . HTN (hypertension)   . Shortness of breath    exertion  . Sleep apnea    STOP BANG 4  . Thyroid dysfunction     Family History  Problem Relation Age of Onset  . Pseudochol deficiency Neg Hx   . Malignant hyperthermia Neg Hx   . Hypotension Neg Hx   . Anesthesia problems Neg Hx     Past Surgical History:  Procedure Laterality Date  . ABDOMINAL HYSTERECTOMY    . arthroscopic surgery rt knee  1997  . BREAST SURGERY  2001   LEFT/ CANCER   . broken radius ulna  1990   left  . broken right ankle  1976  . JOINT REPLACEMENT  right  knee 2005 [place]  . left knee  2004  . rt foot bone spur removed  1992  . THYROID SURGERY  1983  . TOTAL KNEE ARTHROPLASTY  01/22/2012   Procedure: TOTAL KNEE ARTHROPLASTY;  Surgeon: Carole Civil, MD;  Location: [facility];  Service: Orthopedics;  Laterality: Left;  . TUBAL LIGATION     Social History   Occupational History  . Not on file.   Social History Main Topics  . Smoking status: Former Research scientist (life sciences)  . Smokeless tobacco: Former Systems developer  . Alcohol use No  . Drug use: No  . Sexual activity: No

## 2016-08-16 NOTE — Progress Notes (Deleted)
   Office Visit Note   Patient: [name]           Date of Birth: 1930-06-17           MRN: [id number] Visit Date: 08/16/2016              Requested by: Caryl Bis, MD [place], [place] [address] PCP: Gar Ponto, MD   Assessment & Plan: Visit Diagnoses: No diagnosis found.  Plan: ***  Follow-Up Instructions: No Follow-up on file.   Orders:  No orders of the defined types were placed in this encounter.  No orders of the defined types were placed in this encounter.     Procedures: No procedures performed   Clinical Data: No additional findings.   Subjective: Chief Complaint  Patient presents with  . Left Wrist - Fracture, Routine Post Op    Pt is post op of Left wrist 06/15/16, ORIF  Pt complains of no pain,no swelling    Review of Systems   Objective: Vital Signs: BP (!) 105/50   Pulse 69   Resp 14   Ht 4\' 9"  (1.448 m)   Wt 190 lb (86.2 kg)   BMI 41.12 kg/m   Physical Exam  Ortho Exam  Specialty Comments:  No specialty comments available.  Imaging: No results found.   PMFS History: Patient Active Problem List   Diagnosis Date Noted  . Fracture of left distal radius 07/26/2016  . S/P total knee replacement 01/28/2013  . OA (osteoarthritis) of knee 10/31/2011  . S/P knee replacement 05/18/2011  . OSTEOARTHRITIS, KNEE, LEFT 05/11/2010  . [place] DISEASE, LUMBAR SPINE 10/19/2008  . SCIATICA 07/13/2008  . KNEE PAIN 05/28/2008  . LOW BACK PAIN 05/28/2008  . SCOLIOSIS-IDIOPATHIC 05/28/2008  . Knee joint replacement by other means 05/28/2008   Past Medical History:  Diagnosis Date  . Anxiety   . Arthritis   . Asthma   . Cancer ([place])    breast  . Depression   . Diverticulosis   . GERD (gastroesophageal reflux disease)   . HTN (hypertension)   . Shortness of breath    exertion  . Sleep apnea    STOP BANG 4  . Thyroid dysfunction     Family History  Problem Relation Age of Onset  . Pseudochol  deficiency Neg Hx   . Malignant hyperthermia Neg Hx   . Hypotension Neg Hx   . Anesthesia problems Neg Hx     Past Surgical History:  Procedure Laterality Date  . ABDOMINAL HYSTERECTOMY    . arthroscopic surgery rt knee  1997  . BREAST SURGERY  2001   LEFT/ CANCER   . broken radius ulna  1990   left  . broken right ankle  1976  . JOINT REPLACEMENT  right knee 2005 [place]  . left knee  2004  . rt foot bone spur removed  1992  . THYROID SURGERY  1983  . TOTAL KNEE ARTHROPLASTY  01/22/2012   Procedure: TOTAL KNEE ARTHROPLASTY;  Surgeon: Carole Civil, MD;  Location: [facility];  Service: Orthopedics;  Laterality: Left;  . TUBAL LIGATION     Social History   Occupational History  . Not on file.   Social History Main Topics  . Smoking status: Former Research scientist (life sciences)  . Smokeless tobacco: Former Systems developer  . Alcohol use No  . Drug use: No  . Sexual activity: No

## 2016-08-22 ENCOUNTER — Other Ambulatory Visit: Payer: Self-pay | Admitting: *Deleted

## 2016-08-22 MED ORDER — ALBUTEROL SULFATE HFA 108 (90 BASE) MCG/ACT IN AERS: 2.0000 | INHALATION_SPRAY | RESPIRATORY_TRACT | 1 refills | Status: DC | PRN

## 2016-08-22 NOTE — Telephone Encounter (Signed)
PA came over for Proair Respiclick switch to regular Proair hfa

## 2016-08-23 DIAGNOSIS — Z6839 Body mass index (BMI) 39.0-39.9, adult: Secondary | ICD-10-CM | POA: Diagnosis not present

## 2016-08-23 DIAGNOSIS — Z01419 Encounter for gynecological examination (general) (routine) without abnormal findings: Secondary | ICD-10-CM | POA: Diagnosis not present

## 2016-08-28 ENCOUNTER — Other Ambulatory Visit: Payer: Self-pay | Admitting: *Deleted

## 2016-08-31 DIAGNOSIS — K210 Gastro-esophageal reflux disease with esophagitis: Secondary | ICD-10-CM | POA: Diagnosis not present

## 2016-08-31 DIAGNOSIS — E876 Hypokalemia: Secondary | ICD-10-CM | POA: Diagnosis not present

## 2016-08-31 DIAGNOSIS — E782 Mixed hyperlipidemia: Secondary | ICD-10-CM | POA: Diagnosis not present

## 2016-08-31 DIAGNOSIS — F331 Major depressive disorder, recurrent, moderate: Secondary | ICD-10-CM | POA: Diagnosis not present

## 2016-08-31 DIAGNOSIS — I10 Essential (primary) hypertension: Secondary | ICD-10-CM | POA: Diagnosis not present

## 2016-09-06 DIAGNOSIS — F331 Major depressive disorder, recurrent, moderate: Secondary | ICD-10-CM | POA: Diagnosis not present

## 2016-09-06 DIAGNOSIS — J301 Allergic rhinitis due to pollen: Secondary | ICD-10-CM | POA: Diagnosis not present

## 2016-09-06 DIAGNOSIS — E6609 Other obesity due to excess calories: Secondary | ICD-10-CM | POA: Diagnosis not present

## 2016-09-06 DIAGNOSIS — E782 Mixed hyperlipidemia: Secondary | ICD-10-CM | POA: Diagnosis not present

## 2016-09-06 DIAGNOSIS — K219 Gastro-esophageal reflux disease without esophagitis: Secondary | ICD-10-CM | POA: Diagnosis not present

## 2016-09-06 DIAGNOSIS — E876 Hypokalemia: Secondary | ICD-10-CM | POA: Diagnosis not present

## 2016-09-06 DIAGNOSIS — I10 Essential (primary) hypertension: Secondary | ICD-10-CM | POA: Diagnosis not present

## 2016-09-11 DIAGNOSIS — Z1231 Encounter for screening mammogram for malignant neoplasm of breast: Secondary | ICD-10-CM | POA: Diagnosis not present

## 2016-10-04 ENCOUNTER — Encounter (INDEPENDENT_AMBULATORY_CARE_PROVIDER_SITE_OTHER): Payer: Self-pay | Admitting: Orthopaedic Surgery

## 2016-10-04 ENCOUNTER — Ambulatory Visit (INDEPENDENT_AMBULATORY_CARE_PROVIDER_SITE_OTHER): Payer: Medicare Other | Admitting: Orthopaedic Surgery

## 2016-10-04 VITALS — BP 121/63 | HR 71 | Ht <= 58 in | Wt 190.0 lb

## 2016-10-04 DIAGNOSIS — M25532 Pain in left wrist: Secondary | ICD-10-CM | POA: Diagnosis not present

## 2016-10-04 NOTE — Progress Notes (Signed)
Office Visit Note   Patient: [name]           Date of Birth: 1930-01-24           MRN: [id number] Visit Date: 10/04/2016              Requested by: Cheryl Bis, MD [place], [place] [address] PCP: Cheryl Ponto, MD   Assessment & Plan: Visit Diagnoses: Recurrent left wrist pain status post open reduction internal fixation of left distal radius fracture in September. Recent pain could be related to overuse. Presently minimally uncomfortable. I do not see any significant problems today  Plan: Reassurance follow-up as necessary.  Follow-Up Instructions: No Follow-up on file.   Orders:  No orders of the defined types were placed in this encounter.  No orders of the defined types were placed in this encounter.     Procedures: No procedures performed   Clinical Data: No additional findings.   Subjective: Chief Complaint  Patient presents with  . Left Wrist - Pain, Edema    Pt having chronic left wrist pain, think she cooked too much during the holidays, and doing housework  [name] is status post open reduction and internal fixation of a left dominant distal radius fracture in September. She experienced some wrist pain during the Christmas season with increased activities i.e. cooking and cleaning house. Presently she is having minimal discomfort but just needed some reassurance.Review of Systems  in follow-up films of her left wrist there may have been some loss of reduction with possible protrusionof the smooth pins into the wrist.   Objective: Vital Signs: BP 121/63   Pulse 71   Ht 4\' 9"  (1.448 m)   Wt 190 lb (86.2 kg)   BMI 41.12 kg/m   Physical Exam  Ortho Exam left wrist exam demonstrates no evidence of swelling and erythema or ecchymosis. She does have diffuse arthritic changes in her hand  and is unable to make a full fist. A similar finding is noted on the right. Neurovascular exam intact. There is radial deviation of her left wrist  but no localized areas of tenderness. Some mild pain with flexion and extension pronation and supination mild loss of the above motions. This is not a functional loss  Specialty Comments:  No specialty comments available.  Imaging: No results found.   PMFS History: Patient Active Problem List   Diagnosis Date Noted  . Fracture of left distal radius 07/26/2016  . S/P total knee replacement 01/28/2013  . OA (osteoarthritis) of knee 10/31/2011  . S/P knee replacement 05/18/2011  . OSTEOARTHRITIS, KNEE, LEFT 05/11/2010  . [name] DISEASE, LUMBAR SPINE 10/19/2008  . SCIATICA 07/13/2008  . KNEE PAIN 05/28/2008  . LOW BACK PAIN 05/28/2008  . SCOLIOSIS-IDIOPATHIC 05/28/2008  . Knee joint replacement by other means 05/28/2008   Past Medical History:  Diagnosis Date  . Anxiety   . Arthritis   . Asthma   . Cancer ([place])    breast  . Depression   . Diverticulosis   . GERD (gastroesophageal reflux disease)   . HTN (hypertension)   . Shortness of breath    exertion  . Sleep apnea    STOP BANG 4  . Thyroid dysfunction     Family History  Problem Relation Age of Onset  . Pseudochol deficiency Neg Hx   . Malignant hyperthermia Neg Hx   . Hypotension Neg Hx   . Anesthesia problems Neg Hx  Past Surgical History:  Procedure Laterality Date  . ABDOMINAL HYSTERECTOMY    . arthroscopic surgery rt knee  1997  . BREAST SURGERY  2001   LEFT/ CANCER   . broken radius ulna  1990   left  . broken right ankle  1976  . JOINT REPLACEMENT  right knee 2005 [place]  . left knee  2004  . rt foot bone spur removed  1992  . THYROID SURGERY  1983  . TOTAL KNEE ARTHROPLASTY  01/22/2012   Procedure: TOTAL KNEE ARTHROPLASTY;  Surgeon: Carole Civil, MD;  Location: [facility];  Service: Orthopedics;  Laterality: Left;  . TUBAL LIGATION     Social History   Occupational History  . Not on file.   Social History Main Topics  . Smoking status: Former Research scientist (life sciences)  . Smokeless tobacco:  Former Systems developer  . Alcohol use No  . Drug use: No  . Sexual activity: No

## 2016-10-24 ENCOUNTER — Encounter: Payer: Self-pay | Admitting: Allergy & Immunology

## 2016-10-24 ENCOUNTER — Ambulatory Visit (INDEPENDENT_AMBULATORY_CARE_PROVIDER_SITE_OTHER): Payer: Medicare Other | Admitting: Allergy & Immunology

## 2016-10-24 VITALS — BP 126/66 | HR 61 | Temp 97.5°F | Resp 18

## 2016-10-24 DIAGNOSIS — R059 Cough, unspecified: Secondary | ICD-10-CM

## 2016-10-24 DIAGNOSIS — R05 Cough: Secondary | ICD-10-CM | POA: Diagnosis not present

## 2016-10-24 DIAGNOSIS — J4540 Moderate persistent asthma, uncomplicated: Secondary | ICD-10-CM | POA: Diagnosis not present

## 2016-10-24 DIAGNOSIS — J300 Vasomotor rhinitis: Secondary | ICD-10-CM | POA: Diagnosis not present

## 2016-10-24 MED ORDER — OLOPATADINE HCL 0.6 % NA SOLN: 2.0000 | Freq: Every day | NASAL | 5 refills | Status: DC

## 2016-10-24 MED ORDER — FLUTICASONE PROPIONATE 50 MCG/ACT NA SUSP: 2.0000 | Freq: Every day | NASAL | 5 refills | Status: DC

## 2016-10-24 NOTE — Patient Instructions (Addendum)
1. Moderate persistent asthma, uncomplicated - Daily controller medication(s): Breo 200/25 one puff once daily - Rescue medications: ProAir 4 puffs every 4-6 hours as needed or albuterol nebulizer one vial puffs every 4-6 hours as needed - Asthma control goals:  * Full participation in all desired activities (may need albuterol before activity) * Albuterol use two time or less a week on average (not counting use with activity) * Cough interfering with sleep two time or less a month * Oral steroids no more than once a year * No hospitalizations  2. Chronic non-allergic vasomotor rhinitis - with chronic cough - Continue with Flonase two sprays per nostril daily. - Add Patanase two sprays in each nostril every morning.  - Call us in two weeks to let us know how that is going.   3. Return in about 3 months (around 01/22/2017).  Please inform us of any Emergency Department visits, hospitalizations, or changes in symptoms. Call us before going to the ED for breathing or allergy symptoms since we might be able to fit you in for a sick visit. Feel free to contact us anytime with any questions, problems, or concerns.  It was a pleasure to see you again today!   Websites that have reliable patient information: 1. American Academy of Asthma, Allergy, and Immunology: www.aaaai.org 2. Food Allergy Research and Education (FARE): foodallergy.org 3. Mothers of Asthmatics: http://www.asthmacommunitynetwork.org 4. American College of Allergy, Asthma, and Immunology: www.acaai.org

## 2016-10-24 NOTE — Progress Notes (Signed)
FOLLOW UP  Date of Service/Encounter:  10/24/16   Assessment:   Moderate persistent asthma, uncomplicated  Chronic vasomotor rhinitis  Cough   Asthma Reportables:  Severity: moderate persistent  Risk: low Control: well controlled  Seasonal Influenza Vaccine: yes     Plan/Recommendations:   1. Moderate persistent asthma, uncomplicated - Daily controller medication(s): Breo 200/25 one puff once daily - Rescue medications: ProAir 4 puffs every 4-6 hours as needed or albuterol nebulizer one vial puffs every 4-6 hours as needed - Asthma control goals:  * Full participation in all desired activities (may need albuterol before activity) * Albuterol use two time or less a week on average (not counting use with activity) * Cough interfering with sleep two time or less a month * Oral steroids no more than once a year * No hospitalizations  2. Chronic non-allergic vasomotor rhinitis - with chronic cough - Continue with Flonase two sprays per nostril daily. - Add Patanase two sprays in each nostril every morning.  - Call us in two weeks to let us know how that is going.  - She does have a history of reflux, and is on Zantac twice daily. - If there is no improvement with the chronic cough following the addition of the Patanase, we will plan to change her to Prilosec 20 mg daily. - Ms. Stoudt does have a history of smoking, although she stopped 30 years ago and smoked only socially (bowling alleys, softball games).   3. Return in about 3 months (around 01/22/2017).   Subjective:   [name] is a 83 y.o. female presenting today for follow up of  Chief Complaint  Patient presents with  . Follow-up    Asthma- has a constant cough    [name] has a history of the following: Patient Active Problem List   Diagnosis Date Noted  . Fracture of left distal radius 07/26/2016  . S/P total knee replacement 01/28/2013  . OA (osteoarthritis) of knee 10/31/2011    . S/P knee replacement 05/18/2011  . OSTEOARTHRITIS, KNEE, LEFT 05/11/2010  . [place] DISEASE, LUMBAR SPINE 10/19/2008  . SCIATICA 07/13/2008  . KNEE PAIN 05/28/2008  . LOW BACK PAIN 05/28/2008  . SCOLIOSIS-IDIOPATHIC 05/28/2008  . Knee joint replacement by other means 05/28/2008    History obtained from: chart review and patient and patient's son who accompanies her today.  [name] was referred by Gar Ponto, MD.     [name] is a 83 y.o. female presenting for a follow up visit. She was last seen in November 2017. At that time, she was having problems depressing the Symbicort to administer the medication, therefore we switched her to [facility] 200/25. She has a history of nonallergic rhinitis continue Flonase 2 sprays per nostril daily. She had a chronic cough for the last visit and we attempted to treat with Tessalon Perles.  Since last visit, she has done well from an asthma perspective.[name]'s asthma has been well controlled. She has not required rescue medication, experienced nocturnal awakenings due to lower respiratory symptoms, nor have activities of daily living been limited. She remains on her Breo one inhalation once daily. She is using ProAir fairly rarely. She continues to have a chronic cough that has been responsive to Robitussin. We provided her with Ladona Ridgel at the last visit, but this does not seem to have helped. Her cough is productive of clear sputum. She feels that it might be related to postnasal drip. She remains on the  Flonase 2 sprays per nostril once daily. She does this at night. Her cough is tenuous throughout the day. She does have symptoms of heartburn even on the Zantac, which she treats with Tums. This is not a frequent occurrence however. The cough has been interrupting her activities of daily living. In fact, she can no longer go to church because she is afraid of interrupting the service. She is not on an ACE inhibitor.  Otherwise,  there have been no changes to her past medical history, surgical history, family history, or social history.    Review of Systems: a 14-point review of systems is pertinent for what is mentioned in HPI.  Otherwise, all other systems were negative. Constitutional: negative other than that listed in the HPI Eyes: negative other than that listed in the HPI Ears, nose, mouth, throat, and face: negative other than that listed in the HPI Respiratory: negative other than that listed in the HPI Cardiovascular: negative other than that listed in the HPI Gastrointestinal: negative other than that listed in the HPI Genitourinary: negative other than that listed in the HPI Integument: negative other than that listed in the HPI Hematologic: negative other than that listed in the HPI Musculoskeletal: negative other than that listed in the HPI Neurological: negative other than that listed in the HPI Allergy/Immunologic: negative other than that listed in the HPI    Objective:   Blood pressure 126/66, pulse 61, temperature 97.5 F (36.4 C), temperature source Oral, resp. rate 18, SpO2 97 %. There is no height or weight on file to calculate BMI.   Physical Exam:  General: Alert, interactive, in no acute distress. Cooperative with the exam. Pleasant.  Eyes: No conjunctival injection present on the right, No conjunctival injection present on the left, No discharge on the right, No discharge on the left and No Horner-Trantas dots present Ears: Right TM pearly gray with normal light reflex, Left TM pearly gray with normal light reflex, Right TM intact without perforation and Left TM intact without perforation.  Nose/Throat: External nose within normal limits and septum midline, turbinates edematous and pale with clear discharge, post-pharynx erythematous without cobblestoning in the posterior oropharynx. Tonsils 2+ without exudates Neck: Supple without thyromegaly. Lungs: Clear to auscultation without  wheezing, rhonchi or rales. No increased work of breathing. CV: Normal S1/S2, no murmurs. Capillary refill <2 seconds.  Skin: Warm and dry, without lesions or rashes. Neuro:   Grossly intact. No focal deficits appreciated. Responsive to questions.   Diagnostic studies:  Spirometry: results normal (FEV1: 0.83/92%, FVC: 1.05/86%, FEV1/FVC: 79%).    Spirometry consistent with normal pattern.   Allergy Studies: None    Salvatore Marvel, MD [facility]

## 2016-11-27 ENCOUNTER — Telehealth: Payer: Self-pay | Admitting: Allergy and Immunology

## 2016-11-27 NOTE — Telephone Encounter (Signed)
Pt received a bill from [facility] [place] [facility] - told her she needed to call them

## 2016-11-27 NOTE — Telephone Encounter (Signed)
patient has received a bill and has some questions, [employer] didn't pay for this and she has called them and [employer] is asking questions she cannont answer Please call pt to answer any questions

## 2017-01-03 DIAGNOSIS — I10 Essential (primary) hypertension: Secondary | ICD-10-CM | POA: Diagnosis not present

## 2017-01-03 DIAGNOSIS — E876 Hypokalemia: Secondary | ICD-10-CM | POA: Diagnosis not present

## 2017-01-03 DIAGNOSIS — F331 Major depressive disorder, recurrent, moderate: Secondary | ICD-10-CM | POA: Diagnosis not present

## 2017-01-03 DIAGNOSIS — E782 Mixed hyperlipidemia: Secondary | ICD-10-CM | POA: Diagnosis not present

## 2017-01-09 DIAGNOSIS — R109 Unspecified abdominal pain: Secondary | ICD-10-CM | POA: Diagnosis not present

## 2017-01-09 DIAGNOSIS — R102 Pelvic and perineal pain: Secondary | ICD-10-CM | POA: Diagnosis not present

## 2017-01-23 ENCOUNTER — Ambulatory Visit: Payer: Medicare Other | Admitting: Allergy & Immunology

## 2017-01-23 DIAGNOSIS — R102 Pelvic and perineal pain: Secondary | ICD-10-CM | POA: Diagnosis not present

## 2017-01-23 DIAGNOSIS — Z6838 Body mass index (BMI) 38.0-38.9, adult: Secondary | ICD-10-CM | POA: Diagnosis not present

## 2017-01-23 DIAGNOSIS — D252 Subserosal leiomyoma of uterus: Secondary | ICD-10-CM | POA: Diagnosis not present

## 2017-03-05 ENCOUNTER — Encounter: Payer: Self-pay | Admitting: Orthopedic Surgery

## 2017-03-05 ENCOUNTER — Ambulatory Visit (INDEPENDENT_AMBULATORY_CARE_PROVIDER_SITE_OTHER): Payer: Medicare Other

## 2017-03-05 ENCOUNTER — Ambulatory Visit (INDEPENDENT_AMBULATORY_CARE_PROVIDER_SITE_OTHER): Payer: Medicare Other | Admitting: Orthopedic Surgery

## 2017-03-05 DIAGNOSIS — M1710 Unilateral primary osteoarthritis, unspecified knee: Secondary | ICD-10-CM | POA: Diagnosis not present

## 2017-03-05 DIAGNOSIS — Z96652 Presence of left artificial knee joint: Secondary | ICD-10-CM

## 2017-03-05 NOTE — Progress Notes (Signed)
ANNUAL FOLLOW UP FOR  left TKA   Chief Complaint  Patient presents with  . Follow-up    annual xray LT TKA, DOS 01/22/12     HPI: The patient is here for the annual  follow-up x-ray for knee replacement   Review of Systems  Neurological: Positive for sensory change and focal weakness.   Losing balance   Examination of the left KNEE   Inspection shows : incision healed nicely without erythema, no tenderness no swelling  Range of motion total range of motion is 120  Stability the knee is stable anterior to posterior as well as medial to lateral  Strength quadriceps strength is normal  Skin no erythema around the skin incision  Cardiovascular NO EDEMA    Medical decision-making section  X-rays ordered with the following personal interpretation  Normal alignment without loosening   Diagnosis  Encounter Diagnosis  Name Primary?  Marland Kitchen Arthritis of knee Yes   rec balance training at PT   Plan follow-up 1 year repeat x-rays

## 2017-03-05 NOTE — Addendum Note (Signed)
Addended by: Baldomero Lamy B on: 03/05/2017 10:46 AM   Modules accepted: Orders

## 2017-03-06 ENCOUNTER — Encounter: Payer: Self-pay | Admitting: Allergy & Immunology

## 2017-03-06 ENCOUNTER — Ambulatory Visit (INDEPENDENT_AMBULATORY_CARE_PROVIDER_SITE_OTHER): Payer: Medicare Other | Admitting: Allergy & Immunology

## 2017-03-06 ENCOUNTER — Telehealth: Payer: Self-pay

## 2017-03-06 VITALS — BP 110/72 | HR 61 | Temp 97.4°F | Resp 18 | Ht <= 58 in | Wt 189.6 lb

## 2017-03-06 DIAGNOSIS — K219 Gastro-esophageal reflux disease without esophagitis: Secondary | ICD-10-CM

## 2017-03-06 DIAGNOSIS — J4540 Moderate persistent asthma, uncomplicated: Secondary | ICD-10-CM

## 2017-03-06 DIAGNOSIS — R05 Cough: Secondary | ICD-10-CM | POA: Diagnosis not present

## 2017-03-06 DIAGNOSIS — R059 Cough, unspecified: Secondary | ICD-10-CM

## 2017-03-06 DIAGNOSIS — J300 Vasomotor rhinitis: Secondary | ICD-10-CM | POA: Diagnosis not present

## 2017-03-06 MED ORDER — OLOPATADINE HCL 0.6 % NA SOLN: 2.0000 | Freq: Every day | NASAL | 5 refills | Status: DC

## 2017-03-06 MED ORDER — FLUTICASONE PROPIONATE 50 MCG/ACT NA SUSP: 2.0000 | Freq: Every day | NASAL | 5 refills | Status: DC

## 2017-03-06 MED ORDER — ALBUTEROL SULFATE HFA 108 (90 BASE) MCG/ACT IN AERS: 2.0000 | INHALATION_SPRAY | RESPIRATORY_TRACT | 1 refills | Status: DC | PRN

## 2017-03-06 MED ORDER — MONTELUKAST SODIUM 10 MG PO TABS: 10.0000 mg | ORAL_TABLET | Freq: Every day | ORAL | 5 refills | Status: DC

## 2017-03-06 MED ORDER — OMEPRAZOLE 20 MG PO CPDR: 20.0000 mg | DELAYED_RELEASE_CAPSULE | Freq: Two times a day (BID) | ORAL | 5 refills | Status: DC

## 2017-03-06 MED ORDER — BREO ELLIPTA 200-25 MCG/INH IN AEPB: 1.0000 | INHALATION_SPRAY | Freq: Every day | RESPIRATORY_TRACT | 5 refills | Status: AC

## 2017-03-06 NOTE — Progress Notes (Signed)
FOLLOW UP  Date of Service/Encounter:  03/06/17   Assessment:   Moderate persistent asthma without complication  Questionable medication compliance  Chronic vasomotor rhinitis  Gastroesophageal reflux disease - not controlled on H2 blockade  Cough - productive of clear mucous     Asthma Reportables:  Severity: moderate persistent  Risk: low Control: not well controlled   Plan/Recommendations:   1. Moderate persistent asthma, uncomplicated - Lung function was stable today. - It is difficult to ascertain her symptoms and response to medications, as it seems that she is not requiring medications. - Her son who accompanies her today is unable to provide a good history either. - We will get a chest x-ray due to the chronic nature of cough. - We will send in refills on the medications.  - Daily controller medication(s): Breo 200/25 one puff once daily - Rescue medications: ProAir 4 puffs every 4-6 hours as needed or albuterol nebulizer one vial puffs every 4-6 hours as needed - Asthma control goals:  * Full participation in all desired activities (may need albuterol before activity) * Albuterol use two time or less a week on average (not counting use with activity) * Cough interfering with sleep two time or less a month * Oral steroids no more than once a year * No hospitalizations  2. Chronic non-allergic vasomotor rhinitis - with chronic cough - Continue with Flonase two sprays per nostril daily. - Continue with Patanase two sprays in each nostril every morning.   3. Reflux - not well controlled -  We will send in a prescription for omeprazole 20mg  twice daily. - This a little stronger than the ranitidine, so hopefully this will control your reflux better.  - You can continue with the cough syrup as needed.   4. Return in about 3 months (around 06/06/2017).  Subjective:   [name] is a 83 y.o. female presenting today for follow up of  Chief Complaint    Patient presents with  . Asthma  . Cough    productive     [name] has a history of the following: Patient Active Problem List   Diagnosis Date Noted  . Fracture of left distal radius 07/26/2016  . S/P total knee replacement 01/28/2013  . OA (osteoarthritis) of knee 10/31/2011  . S/P knee replacement 05/18/2011  . OSTEOARTHRITIS, KNEE, LEFT 05/11/2010  . [place] DISEASE, LUMBAR SPINE 10/19/2008  . SCIATICA 07/13/2008  . KNEE PAIN 05/28/2008  . LOW BACK PAIN 05/28/2008  . SCOLIOSIS-IDIOPATHIC 05/28/2008  . Knee joint replacement by other means 05/28/2008    History obtained from: chart review and patient and patient's son.  [name] was referred by Caryl Bis, MD.     [name] is a 83 y.o. female presenting for a follow up visit. She was last seen in January 2018. At that time, she was doing very well on Breo 200/25 g 1 inhalation once daily. She has a history of allergic rhinitis and was continued on fluticasone nasal spray 2 sprays per nostril once daily. She continued to have a chronic cough. Therefore, we added Patanase 2 sprays per nostril every morning. She was already on Zantac twice daily, and she did not want to switch to a proton pump inhibitor.  Since the last visit, she continues to have coughing episodes. She coughs throughout the day and the night. She thinks that she ran out of the [place], but it never seemed to make any difference anyway. She has  not been using her albuterol, but it should be noted that has run out of it and would not have been able to use it anyway. She has had no prednisone or ER visits for her cough. Her cough is productive of clear sputum. She has had no fevers with this cough. She is on nasal sprays, but it is not clear how often she is using them if at all. She does have both loratadine and cetirizine in her bag of medications, but again, it is unsure how often she is using it. Therefore, and is unclear whether the  nasal sprays are providing any benefit.  She remains on ranitidine twice daily for her reflux. Her son tells me that she is on omeprazole, although she says that she only takes ranitidine for her reflux. We had discussed starting a proton pump inhibitor at the last visit, and since she continues to have problems with this cough we will start it today. Review of her chart shows that she has not had a chest x-ray for this cough.  Otherwise, there have been no changes to her past medical history, surgical history, family history, or social history.    Review of Systems: a 14-point review of systems is pertinent for what is mentioned in HPI.  Otherwise, all other systems were negative. Constitutional: negative other than that listed in the HPI Eyes: negative other than that listed in the HPI Ears, nose, mouth, throat, and face: negative other than that listed in the HPI Respiratory: negative other than that listed in the HPI Cardiovascular: negative other than that listed in the HPI Gastrointestinal: negative other than that listed in the HPI Genitourinary: negative other than that listed in the HPI Integument: negative other than that listed in the HPI Hematologic: negative other than that listed in the HPI Musculoskeletal: negative other than that listed in the HPI Neurological: negative other than that listed in the HPI Allergy/Immunologic: negative other than that listed in the HPI    Objective:   Blood pressure 110/72, pulse 61, temperature 97.4 F (36.3 C), temperature source Oral, resp. rate 18, height 4' 8.3" (1.43 m), weight 189 lb 9.6 oz (86 kg), SpO2 96 %. Body mass index is 42.06 kg/m.   Physical Exam:  General: Alert, interactive, in no acute distress. Obese female. Smiling. Eyes: No conjunctival injection present on the right, No conjunctival injection present on the left, PERRL bilaterally, No discharge on the right, No discharge on the left and No Horner-Trantas dots  present Ears: Right TM pearly gray with normal light reflex, Left TM pearly gray with normal light reflex, Right TM intact without perforation and Left TM intact without perforation.  Nose/Throat: External nose within normal limits and septum midline, turbinates edematous and pale with clear discharge, post-pharynx mildly erythematous without cobblestoning in the posterior oropharynx. Tonsils 2+ without exudates Neck: Supple without thyromegaly. Lungs: Clear to auscultation without wheezing, rhonchi or rales. No increased work of breathing. Coarse upper airway sounds throughout.  CV: Normal S1/S2, no murmurs. Capillary refill <2 seconds.  Skin: Warm and dry, without lesions or rashes. Neuro:   Grossly intact. No focal deficits appreciated. Responsive to questions.   Diagnostic studies:   Spirometry: results abnormal (FEV1: 0.82/87%, FVC: 0.98/78%, FEV1/FVC: 84%).    Spirometry consistent with possible restrictive disease. Compared to her spirometry at the last visit, her values are essentially unchanged. Her previous FEV1 was 0.81 L and her previous FVC was 0.91 L.  Allergy Studies: none    Salvatore Marvel,  MD FAAAAI [facility]

## 2017-03-06 NOTE — Telephone Encounter (Signed)
Spoke to patient to inform her that Dr. Ernst Bowler wanted her to get a chest xray. She stated that she will call us to let us know when she could go get it done.

## 2017-03-06 NOTE — Patient Instructions (Addendum)
1. Moderate persistent asthma, uncomplicated - Lung function was stable today.   - We will send in refills on the medications.  - Daily controller medication(s): Breo 200/25 one puff once daily - Rescue medications: ProAir 4 puffs every 4-6 hours as needed or albuterol nebulizer one vial puffs every 4-6 hours as needed - Asthma control goals:  * Full participation in all desired activities (may need albuterol before activity) * Albuterol use two time or less a week on average (not counting use with activity) * Cough interfering with sleep two time or less a month * Oral steroids no more than once a year * No hospitalizations  2. Chronic non-allergic vasomotor rhinitis - with chronic cough - Continue with Flonase two sprays per nostril daily. - Continue with Patanase two sprays in each nostril every morning.   3. Reflux - not well controlled -  We will send in a prescription for omeprazole 20mg  twice daily. - This a little stronger than the ranitidine, so hopefully this will control your reflux better.  - You can continue with the cough syrup as needed.   4. Return in about 3 months (around 06/06/2017).  Please inform us of any Emergency Department visits, hospitalizations, or changes in symptoms. Call us before going to the ED for breathing or allergy symptoms since we might be able to fit you in for a sick visit. Feel free to contact us anytime with any questions, problems, or concerns.  It was a pleasure to see you again today! Have a good summer!   Websites that have reliable patient information: 1. American Academy of Asthma, Allergy, and Immunology: www.aaaai.org 2. Food Allergy Research and Education (FARE): foodallergy.org 3. Mothers of Asthmatics: http://www.asthmacommunitynetwork.org 4. American College of Allergy, Asthma, and Immunology: www.acaai.org

## 2017-03-07 NOTE — Progress Notes (Signed)
Patient aware and is going to get this done on Friday.

## 2017-03-09 ENCOUNTER — Ambulatory Visit (HOSPITAL_COMMUNITY)
Admission: RE | Admit: 2017-03-09 | Discharge: 2017-03-09 | Disposition: A | Discharge status: Home / Self Care | Payer: Medicare Other | Source: Ambulatory Visit | Attending: Allergy & Immunology | Admitting: Allergy & Immunology

## 2017-03-09 DIAGNOSIS — J4540 Moderate persistent asthma, uncomplicated: Secondary | ICD-10-CM | POA: Insufficient documentation

## 2017-03-09 DIAGNOSIS — R05 Cough: Secondary | ICD-10-CM | POA: Diagnosis not present

## 2017-03-09 DIAGNOSIS — J45909 Unspecified asthma, uncomplicated: Secondary | ICD-10-CM | POA: Diagnosis not present

## 2017-03-09 DIAGNOSIS — R059 Cough, unspecified: Secondary | ICD-10-CM

## 2017-03-12 ENCOUNTER — Ambulatory Visit: Payer: Medicare Other | Admitting: Orthopedic Surgery

## 2017-05-16 DIAGNOSIS — E876 Hypokalemia: Secondary | ICD-10-CM | POA: Diagnosis not present

## 2017-05-16 DIAGNOSIS — I10 Essential (primary) hypertension: Secondary | ICD-10-CM | POA: Diagnosis not present

## 2017-05-16 DIAGNOSIS — F331 Major depressive disorder, recurrent, moderate: Secondary | ICD-10-CM | POA: Diagnosis not present

## 2017-05-16 DIAGNOSIS — E782 Mixed hyperlipidemia: Secondary | ICD-10-CM | POA: Diagnosis not present

## 2017-05-18 DIAGNOSIS — E876 Hypokalemia: Secondary | ICD-10-CM | POA: Diagnosis not present

## 2017-05-18 DIAGNOSIS — Z0001 Encounter for general adult medical examination with abnormal findings: Secondary | ICD-10-CM | POA: Diagnosis not present

## 2017-05-18 DIAGNOSIS — E782 Mixed hyperlipidemia: Secondary | ICD-10-CM | POA: Diagnosis not present

## 2017-05-18 DIAGNOSIS — I10 Essential (primary) hypertension: Secondary | ICD-10-CM | POA: Diagnosis not present

## 2017-06-05 ENCOUNTER — Ambulatory Visit: Payer: Medicare Other | Admitting: Allergy & Immunology

## 2017-08-28 DIAGNOSIS — H353110 Nonexudative age-related macular degeneration, right eye, stage unspecified: Secondary | ICD-10-CM | POA: Diagnosis not present

## 2017-09-11 ENCOUNTER — Ambulatory Visit: Payer: Medicare Other | Admitting: Allergy & Immunology

## 2017-09-20 DIAGNOSIS — I10 Essential (primary) hypertension: Secondary | ICD-10-CM | POA: Diagnosis not present

## 2017-09-20 DIAGNOSIS — E782 Mixed hyperlipidemia: Secondary | ICD-10-CM | POA: Diagnosis not present

## 2017-09-20 DIAGNOSIS — K219 Gastro-esophageal reflux disease without esophagitis: Secondary | ICD-10-CM | POA: Diagnosis not present

## 2017-09-20 DIAGNOSIS — E876 Hypokalemia: Secondary | ICD-10-CM | POA: Diagnosis not present

## 2017-11-19 ENCOUNTER — Telehealth (HOSPITAL_COMMUNITY): Payer: Self-pay | Admitting: *Deleted

## 2017-11-19 NOTE — Telephone Encounter (Signed)
Message sent in Error on Wrong patient-- Correct patient is [name]

## 2017-11-19 NOTE — Telephone Encounter (Signed)
Dr Modesta Messing Patient came in visit with therapist Clovis Riley that since medication change she nervous & can't sleep. She stated dosage was cut down from 100 mg to 25 mg. She can't remember the name but thinks it starts with a 'L'.

## 2018-01-24 DIAGNOSIS — I10 Essential (primary) hypertension: Secondary | ICD-10-CM | POA: Diagnosis not present

## 2018-01-24 DIAGNOSIS — J4530 Mild persistent asthma, uncomplicated: Secondary | ICD-10-CM | POA: Diagnosis not present

## 2018-01-24 DIAGNOSIS — E782 Mixed hyperlipidemia: Secondary | ICD-10-CM | POA: Diagnosis not present

## 2018-01-24 DIAGNOSIS — E876 Hypokalemia: Secondary | ICD-10-CM | POA: Diagnosis not present

## 2018-03-05 ENCOUNTER — Encounter: Payer: Self-pay | Admitting: Orthopedic Surgery

## 2018-03-05 ENCOUNTER — Ambulatory Visit (INDEPENDENT_AMBULATORY_CARE_PROVIDER_SITE_OTHER): Payer: Medicare Other | Admitting: Orthopedic Surgery

## 2018-03-05 ENCOUNTER — Ambulatory Visit (INDEPENDENT_AMBULATORY_CARE_PROVIDER_SITE_OTHER): Payer: Medicaid Other

## 2018-03-05 VITALS — BP 139/69 | HR 69 | Ht <= 58 in | Wt 189.0 lb

## 2018-03-05 DIAGNOSIS — Z96652 Presence of left artificial knee joint: Secondary | ICD-10-CM | POA: Diagnosis not present

## 2018-03-05 DIAGNOSIS — Z96651 Presence of right artificial knee joint: Secondary | ICD-10-CM | POA: Diagnosis not present

## 2018-03-05 NOTE — Progress Notes (Signed)
ANNUAL FOLLOW UP FOR  BILATERAL  TKA   Chief Complaint  Patient presents with  . Post-op Follow-up    bilateral knees left total knee 01/22/12 right total knee 2005      HPI: The patient is here for the annual  follow-up x-ray for knee replacement. The patient is not complaining of pain weakness instability or stiffness in the repaired knee.   Review of Systems  Musculoskeletal: Positive for falls.    Past Medical History:  Diagnosis Date  . Anxiety   . Arthritis   . Asthma   . Cancer ([place])    breast  . Depression   . Diverticulosis   . GERD (gastroesophageal reflux disease)   . HTN (hypertension)   . Shortness of breath    exertion  . Sleep apnea    STOP BANG 4  . Thyroid dysfunction      Examination of the LEFT KNEE  BP 139/69   Pulse 69   Ht 4\' 10"  (1.473 m)   Wt 189 lb (85.7 kg)   BMI 39.50 kg/m   General the patient is normally groomed in no distress  Mood normal Affect pleasant   The patient is Awake and alert ; oriented normal   Inspection shows : incision healed nicely without erythema, no tenderness no swelling  Range of motion total range of motion is 115  Stability the knee is stable anterior to posterior as well as medial to lateral  Strength quadriceps strength is normal  Skin no erythema around the skin incision  Cardiovascular mild peripheral edema neuro: normal sensation in the operative leg  Gait: Requiring use of walker  On the right knee we find a well-healed incision her flexion is excellent she has good strength and stability in the knee skin incision is normal she has mild peripheral edema Medical decision-making section X-rays ordered with the following personal interpretation  Right knee Normal alignment without loosening  Left knee  normal alignment without loosening   Diagnosis  Encounter Diagnoses  Name Primary?  . S/P total knee replacement, left 01/22/12 Yes  . S/P TKR (total knee replacement), right      Plan  follow-up 1 year repeat x-rays

## 2018-04-15 DIAGNOSIS — Z9181 History of falling: Secondary | ICD-10-CM | POA: Diagnosis not present

## 2018-04-15 DIAGNOSIS — R05 Cough: Secondary | ICD-10-CM | POA: Diagnosis not present

## 2018-04-15 DIAGNOSIS — S3992XA Unspecified injury of lower back, initial encounter: Secondary | ICD-10-CM | POA: Diagnosis not present

## 2018-04-15 DIAGNOSIS — M25551 Pain in right hip: Secondary | ICD-10-CM | POA: Diagnosis not present

## 2018-04-15 DIAGNOSIS — R102 Pelvic and perineal pain: Secondary | ICD-10-CM | POA: Diagnosis not present

## 2018-04-15 DIAGNOSIS — M533 Sacrococcygeal disorders, not elsewhere classified: Secondary | ICD-10-CM | POA: Diagnosis not present

## 2018-04-15 DIAGNOSIS — S79911A Unspecified injury of right hip, initial encounter: Secondary | ICD-10-CM | POA: Diagnosis not present

## 2018-04-15 DIAGNOSIS — M545 Low back pain: Secondary | ICD-10-CM | POA: Diagnosis not present

## 2018-05-20 DIAGNOSIS — E876 Hypokalemia: Secondary | ICD-10-CM | POA: Diagnosis not present

## 2018-05-20 DIAGNOSIS — E782 Mixed hyperlipidemia: Secondary | ICD-10-CM | POA: Diagnosis not present

## 2018-05-20 DIAGNOSIS — Z9189 Other specified personal risk factors, not elsewhere classified: Secondary | ICD-10-CM | POA: Diagnosis not present

## 2018-05-20 DIAGNOSIS — F331 Major depressive disorder, recurrent, moderate: Secondary | ICD-10-CM | POA: Diagnosis not present

## 2018-05-23 DIAGNOSIS — Z0001 Encounter for general adult medical examination with abnormal findings: Secondary | ICD-10-CM | POA: Diagnosis not present

## 2018-05-23 DIAGNOSIS — I10 Essential (primary) hypertension: Secondary | ICD-10-CM | POA: Diagnosis not present

## 2018-05-23 DIAGNOSIS — E876 Hypokalemia: Secondary | ICD-10-CM | POA: Diagnosis not present

## 2018-05-23 DIAGNOSIS — E782 Mixed hyperlipidemia: Secondary | ICD-10-CM | POA: Diagnosis not present

## 2018-07-24 IMAGING — DX DG CHEST 2V
2 series · 2 of 2 positions shown · non-contrast
Comparison: 12/20/2015

CLINICAL DATA: History of asthma

EXAM:
CHEST  2 VIEW

[chest pa]
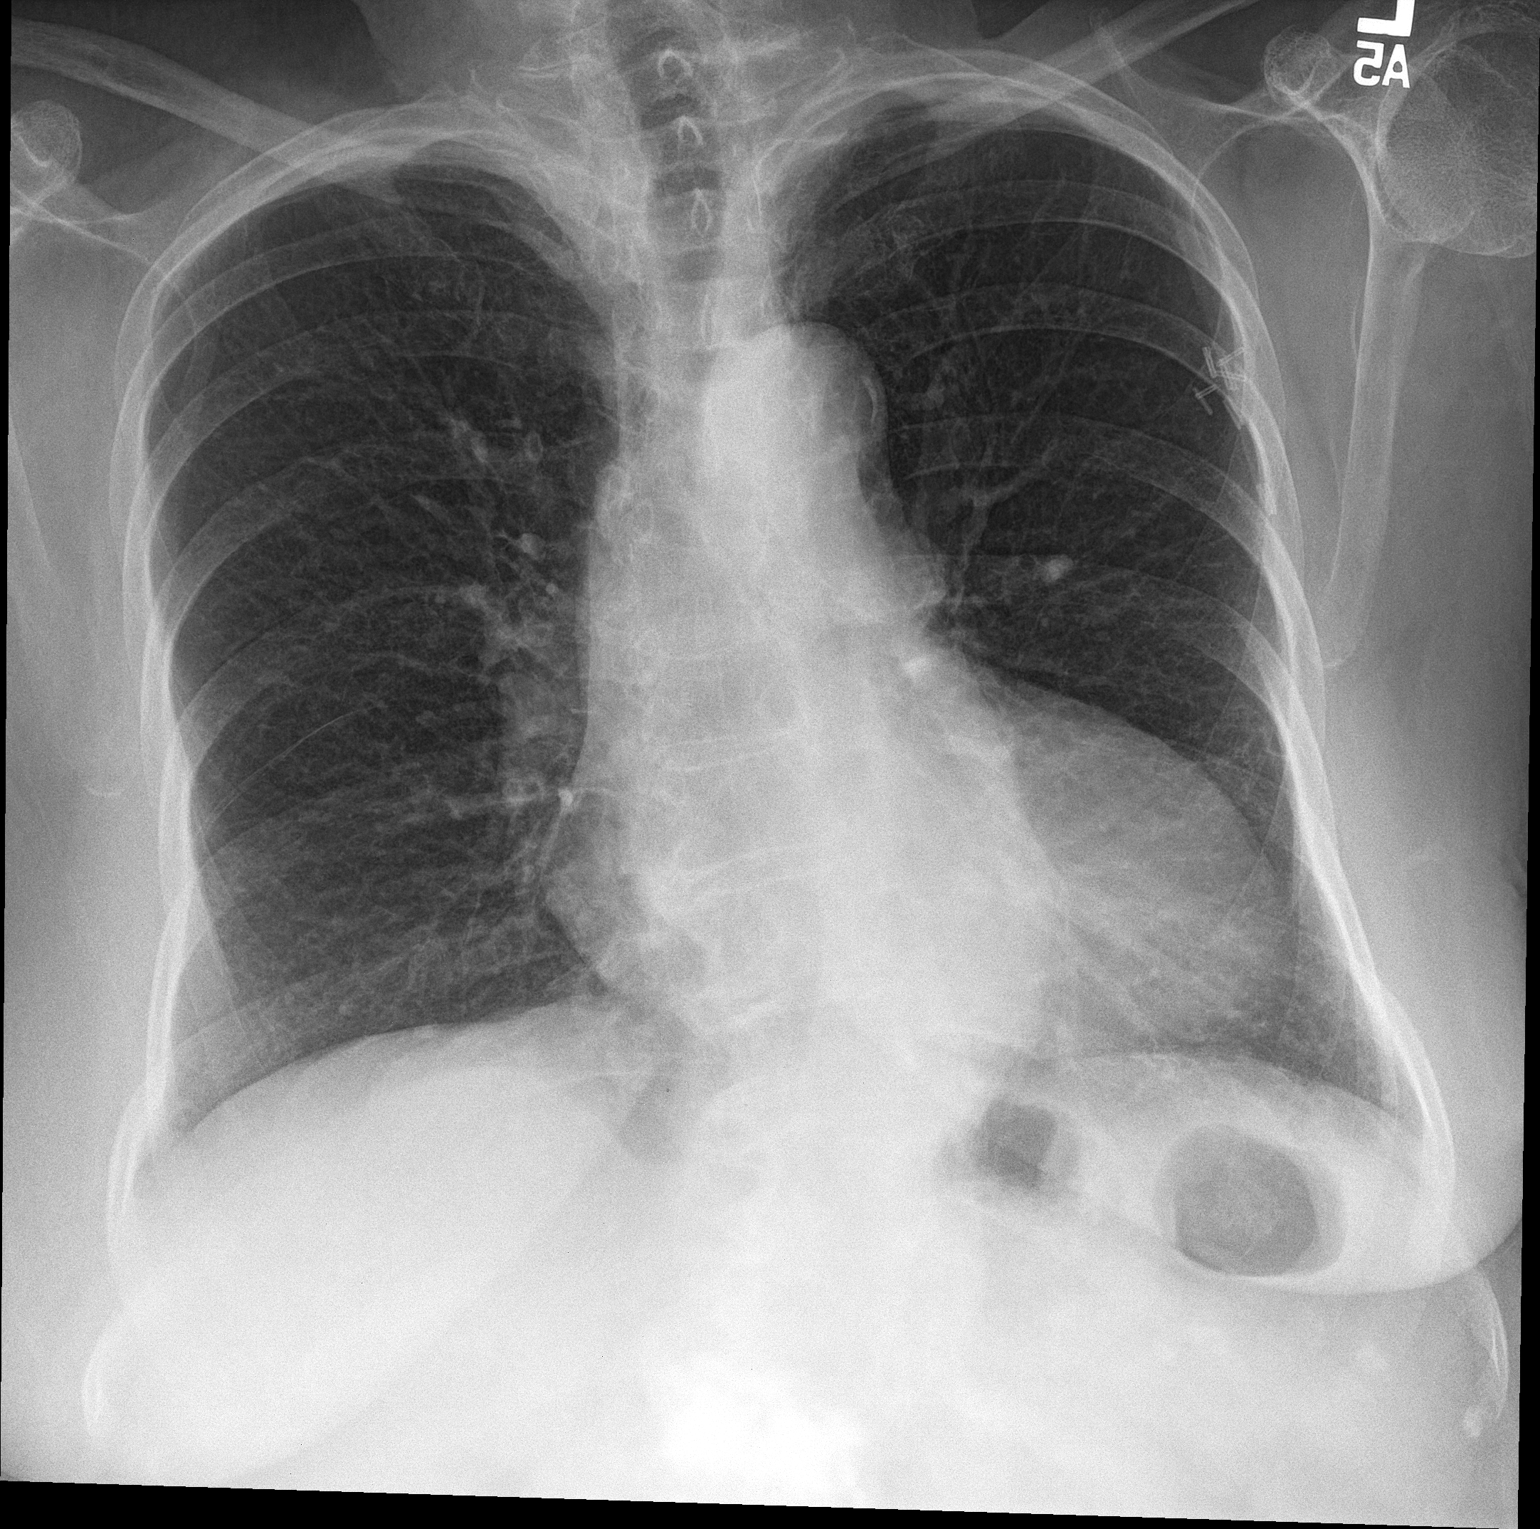

[chest lat]
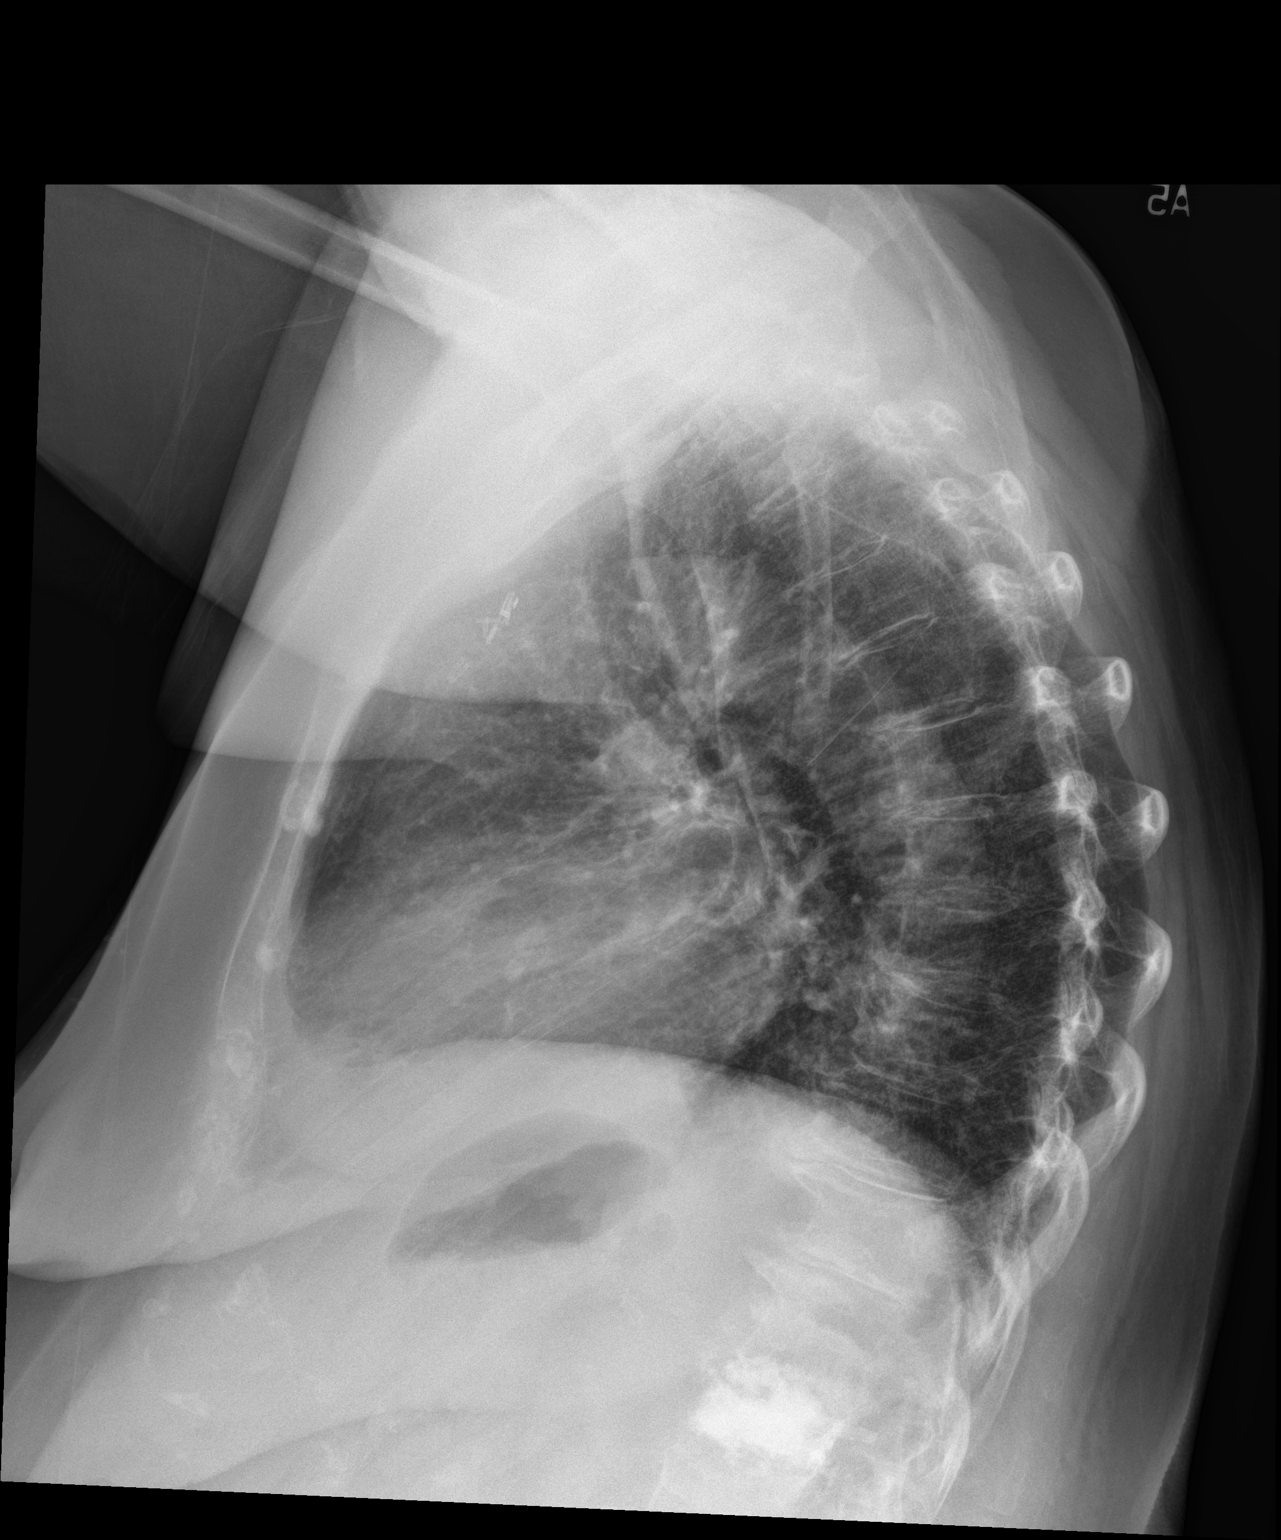

[2 of 2 positions shown; findings below may reference images not displayed]

FINDINGS: Cardiac shadow is mildly enlarged. The lungs are well aerated
bilaterally with mild interstitial changes consistent with the given
clinical history. Postsurgical changes in the left axilla are noted.
Prominent vessel on end is noted in the left hilum. Changes of prior
vertebral augmentation are seen.
IMPRESSION: Mild interstitial changes stable in appearance from the prior exam
likely related to the patient's underlying reactive airways disease.

## 2018-08-24 DIAGNOSIS — E782 Mixed hyperlipidemia: Secondary | ICD-10-CM | POA: Diagnosis not present

## 2018-08-24 DIAGNOSIS — J301 Allergic rhinitis due to pollen: Secondary | ICD-10-CM | POA: Diagnosis not present

## 2018-08-24 DIAGNOSIS — I10 Essential (primary) hypertension: Secondary | ICD-10-CM | POA: Diagnosis not present

## 2018-08-24 DIAGNOSIS — F331 Major depressive disorder, recurrent, moderate: Secondary | ICD-10-CM | POA: Diagnosis not present

## 2018-09-11 DIAGNOSIS — Z6841 Body Mass Index (BMI) 40.0 and over, adult: Secondary | ICD-10-CM | POA: Diagnosis not present

## 2018-09-11 DIAGNOSIS — Z0001 Encounter for general adult medical examination with abnormal findings: Secondary | ICD-10-CM | POA: Diagnosis not present

## 2018-09-11 DIAGNOSIS — F331 Major depressive disorder, recurrent, moderate: Secondary | ICD-10-CM | POA: Diagnosis not present

## 2018-09-11 DIAGNOSIS — I10 Essential (primary) hypertension: Secondary | ICD-10-CM | POA: Diagnosis not present

## 2018-09-11 DIAGNOSIS — E782 Mixed hyperlipidemia: Secondary | ICD-10-CM | POA: Diagnosis not present

## 2018-09-11 DIAGNOSIS — E876 Hypokalemia: Secondary | ICD-10-CM | POA: Diagnosis not present

## 2018-11-11 DIAGNOSIS — M545 Low back pain: Secondary | ICD-10-CM | POA: Diagnosis not present

## 2018-11-11 DIAGNOSIS — N3000 Acute cystitis without hematuria: Secondary | ICD-10-CM | POA: Diagnosis not present

## 2018-11-11 DIAGNOSIS — K219 Gastro-esophageal reflux disease without esophagitis: Secondary | ICD-10-CM | POA: Diagnosis not present

## 2018-11-11 DIAGNOSIS — I6782 Cerebral ischemia: Secondary | ICD-10-CM | POA: Diagnosis not present

## 2018-11-11 DIAGNOSIS — S199XXA Unspecified injury of neck, initial encounter: Secondary | ICD-10-CM | POA: Diagnosis not present

## 2018-11-11 DIAGNOSIS — M419 Scoliosis, unspecified: Secondary | ICD-10-CM | POA: Diagnosis not present

## 2018-11-11 DIAGNOSIS — S3992XA Unspecified injury of lower back, initial encounter: Secondary | ICD-10-CM | POA: Diagnosis not present

## 2018-11-11 DIAGNOSIS — M47816 Spondylosis without myelopathy or radiculopathy, lumbar region: Secondary | ICD-10-CM | POA: Diagnosis not present

## 2018-11-11 DIAGNOSIS — W01198A Fall on same level from slipping, tripping and stumbling with subsequent striking against other object, initial encounter: Secondary | ICD-10-CM | POA: Diagnosis not present

## 2018-11-11 DIAGNOSIS — I10 Essential (primary) hypertension: Secondary | ICD-10-CM | POA: Diagnosis not present

## 2018-11-11 DIAGNOSIS — Z79899 Other long term (current) drug therapy: Secondary | ICD-10-CM | POA: Diagnosis not present

## 2018-11-11 DIAGNOSIS — S0990XA Unspecified injury of head, initial encounter: Secondary | ICD-10-CM | POA: Diagnosis not present

## 2018-11-18 DIAGNOSIS — H0015 Chalazion left lower eyelid: Secondary | ICD-10-CM | POA: Diagnosis not present

## 2018-11-18 DIAGNOSIS — Z6841 Body Mass Index (BMI) 40.0 and over, adult: Secondary | ICD-10-CM | POA: Diagnosis not present

## 2018-11-22 DIAGNOSIS — F331 Major depressive disorder, recurrent, moderate: Secondary | ICD-10-CM | POA: Diagnosis not present

## 2018-11-22 DIAGNOSIS — I10 Essential (primary) hypertension: Secondary | ICD-10-CM | POA: Diagnosis not present

## 2018-12-19 DIAGNOSIS — J45909 Unspecified asthma, uncomplicated: Secondary | ICD-10-CM | POA: Diagnosis not present

## 2018-12-19 DIAGNOSIS — M546 Pain in thoracic spine: Secondary | ICD-10-CM | POA: Diagnosis not present

## 2018-12-19 DIAGNOSIS — R079 Chest pain, unspecified: Secondary | ICD-10-CM | POA: Diagnosis not present

## 2018-12-19 DIAGNOSIS — I10 Essential (primary) hypertension: Secondary | ICD-10-CM | POA: Diagnosis not present

## 2018-12-19 DIAGNOSIS — S2232XA Fracture of one rib, left side, initial encounter for closed fracture: Secondary | ICD-10-CM | POA: Diagnosis not present

## 2018-12-19 DIAGNOSIS — R109 Unspecified abdominal pain: Secondary | ICD-10-CM | POA: Diagnosis not present

## 2018-12-19 DIAGNOSIS — K219 Gastro-esophageal reflux disease without esophagitis: Secondary | ICD-10-CM | POA: Diagnosis not present

## 2018-12-19 DIAGNOSIS — Z853 Personal history of malignant neoplasm of breast: Secondary | ICD-10-CM | POA: Diagnosis not present

## 2018-12-19 DIAGNOSIS — W19XXXA Unspecified fall, initial encounter: Secondary | ICD-10-CM | POA: Diagnosis not present

## 2018-12-19 DIAGNOSIS — Z79899 Other long term (current) drug therapy: Secondary | ICD-10-CM | POA: Diagnosis not present

## 2018-12-19 DIAGNOSIS — M810 Age-related osteoporosis without current pathological fracture: Secondary | ICD-10-CM | POA: Diagnosis not present

## 2018-12-19 DIAGNOSIS — S0990XA Unspecified injury of head, initial encounter: Secondary | ICD-10-CM | POA: Diagnosis not present

## 2018-12-19 DIAGNOSIS — M545 Low back pain: Secondary | ICD-10-CM | POA: Diagnosis not present

## 2018-12-19 DIAGNOSIS — Z87891 Personal history of nicotine dependence: Secondary | ICD-10-CM | POA: Diagnosis not present

## 2018-12-19 DIAGNOSIS — F419 Anxiety disorder, unspecified: Secondary | ICD-10-CM | POA: Diagnosis not present

## 2018-12-23 DIAGNOSIS — I10 Essential (primary) hypertension: Secondary | ICD-10-CM | POA: Diagnosis not present

## 2018-12-23 DIAGNOSIS — J4530 Mild persistent asthma, uncomplicated: Secondary | ICD-10-CM | POA: Diagnosis not present

## 2018-12-23 DIAGNOSIS — F331 Major depressive disorder, recurrent, moderate: Secondary | ICD-10-CM | POA: Diagnosis not present

## 2018-12-23 DIAGNOSIS — E782 Mixed hyperlipidemia: Secondary | ICD-10-CM | POA: Diagnosis not present

## 2018-12-26 DIAGNOSIS — S2239XA Fracture of one rib, unspecified side, initial encounter for closed fracture: Secondary | ICD-10-CM | POA: Diagnosis not present

## 2018-12-26 DIAGNOSIS — Z6841 Body Mass Index (BMI) 40.0 and over, adult: Secondary | ICD-10-CM | POA: Diagnosis not present

## 2019-01-23 DIAGNOSIS — E782 Mixed hyperlipidemia: Secondary | ICD-10-CM | POA: Diagnosis not present

## 2019-01-23 DIAGNOSIS — I10 Essential (primary) hypertension: Secondary | ICD-10-CM | POA: Diagnosis not present

## 2019-02-22 DIAGNOSIS — I10 Essential (primary) hypertension: Secondary | ICD-10-CM | POA: Diagnosis not present

## 2019-02-22 DIAGNOSIS — E782 Mixed hyperlipidemia: Secondary | ICD-10-CM | POA: Diagnosis not present

## 2019-03-05 ENCOUNTER — Encounter: Payer: Self-pay | Admitting: Orthopedic Surgery

## 2019-03-05 ENCOUNTER — Ambulatory Visit: Payer: Self-pay | Admitting: Orthopedic Surgery

## 2019-03-25 DIAGNOSIS — E782 Mixed hyperlipidemia: Secondary | ICD-10-CM | POA: Diagnosis not present

## 2019-03-25 DIAGNOSIS — I10 Essential (primary) hypertension: Secondary | ICD-10-CM | POA: Diagnosis not present

## 2019-04-23 ENCOUNTER — Encounter: Payer: Self-pay | Admitting: Orthopedic Surgery

## 2019-04-23 ENCOUNTER — Other Ambulatory Visit: Payer: Self-pay

## 2019-04-23 ENCOUNTER — Ambulatory Visit (INDEPENDENT_AMBULATORY_CARE_PROVIDER_SITE_OTHER): Payer: Medicare Other | Admitting: Orthopedic Surgery

## 2019-04-23 ENCOUNTER — Ambulatory Visit: Payer: Medicare Other

## 2019-04-23 VITALS — BP 130/66 | HR 66 | Temp 97.2°F | Ht <= 58 in | Wt 183.0 lb

## 2019-04-23 DIAGNOSIS — Z96652 Presence of left artificial knee joint: Secondary | ICD-10-CM

## 2019-04-23 DIAGNOSIS — Z96651 Presence of right artificial knee joint: Secondary | ICD-10-CM | POA: Diagnosis not present

## 2019-04-23 NOTE — Progress Notes (Signed)
ANNUAL FOLLOW UP FOR  LEFT AND RIGHT  TKA   Chief Complaint  Patient presents with  .     BILATERAL KNEE REPLACEMEENTS      HPI: The patient is here for the annual  follow-up x-ray for knee replacements. The patient is not complaining of pain weakness instability or stiffness in the repaired knee.   ROS  Back pain lower leg weakness    Examination of the LEFT  KNEE  BP 130/66   Pulse 66   Ht 4\' 10"  (1.473 m)   Wt 183 lb (83 kg)   BMI 38.25 kg/m   General the patient is normally groomed in no distress  Right knee  Inspection shows : incision healed nicely without erythema, no tenderness no swelling  Range of motion total range of motion is 0-1 20    Left knee range of motion 3 degrees hyperextension flexion 115      Stability in both knees is stable anterior to posterior as well as medial to lateral  Strength quadriceps strength is normal in both knees  Skin no erythema around the skin incision bilateral knees  Neuro: normal sensation in the operative leg both knees Gait: WALKER, CHRONIC SPINAL STENOSIS   Medical decision-making section  X-rays ordered, internal imaging shows (see full dictated report) stable implant with no signs of loosening  Diagnosis  Encounter Diagnoses  Name Primary?  . S/P total knee replacement, left 01/22/12 Yes  . History of total right knee replacement      Plan follow-up 1 year repeat x-rays

## 2019-04-25 DIAGNOSIS — J4530 Mild persistent asthma, uncomplicated: Secondary | ICD-10-CM | POA: Diagnosis not present

## 2019-04-25 DIAGNOSIS — E782 Mixed hyperlipidemia: Secondary | ICD-10-CM | POA: Diagnosis not present

## 2019-04-25 DIAGNOSIS — I10 Essential (primary) hypertension: Secondary | ICD-10-CM | POA: Diagnosis not present

## 2019-05-26 DIAGNOSIS — I10 Essential (primary) hypertension: Secondary | ICD-10-CM | POA: Diagnosis not present

## 2019-05-26 DIAGNOSIS — E782 Mixed hyperlipidemia: Secondary | ICD-10-CM | POA: Diagnosis not present

## 2019-05-28 DIAGNOSIS — F331 Major depressive disorder, recurrent, moderate: Secondary | ICD-10-CM | POA: Diagnosis not present

## 2019-05-28 DIAGNOSIS — E876 Hypokalemia: Secondary | ICD-10-CM | POA: Diagnosis not present

## 2019-05-28 DIAGNOSIS — E782 Mixed hyperlipidemia: Secondary | ICD-10-CM | POA: Diagnosis not present

## 2019-05-28 DIAGNOSIS — I10 Essential (primary) hypertension: Secondary | ICD-10-CM | POA: Diagnosis not present

## 2019-05-28 DIAGNOSIS — Z0001 Encounter for general adult medical examination with abnormal findings: Secondary | ICD-10-CM | POA: Diagnosis not present

## 2019-06-25 DIAGNOSIS — F331 Major depressive disorder, recurrent, moderate: Secondary | ICD-10-CM | POA: Diagnosis not present

## 2019-06-25 DIAGNOSIS — I10 Essential (primary) hypertension: Secondary | ICD-10-CM | POA: Diagnosis not present

## 2019-07-25 DIAGNOSIS — E782 Mixed hyperlipidemia: Secondary | ICD-10-CM | POA: Diagnosis not present

## 2019-07-25 DIAGNOSIS — I10 Essential (primary) hypertension: Secondary | ICD-10-CM | POA: Diagnosis not present

## 2019-08-25 DIAGNOSIS — F331 Major depressive disorder, recurrent, moderate: Secondary | ICD-10-CM | POA: Diagnosis not present

## 2019-08-25 DIAGNOSIS — E782 Mixed hyperlipidemia: Secondary | ICD-10-CM | POA: Diagnosis not present

## 2019-08-26 DIAGNOSIS — H43811 Vitreous degeneration, right eye: Secondary | ICD-10-CM | POA: Diagnosis not present

## 2019-09-09 DIAGNOSIS — M25512 Pain in left shoulder: Secondary | ICD-10-CM | POA: Diagnosis not present

## 2019-09-09 DIAGNOSIS — Z79899 Other long term (current) drug therapy: Secondary | ICD-10-CM | POA: Diagnosis not present

## 2019-09-09 DIAGNOSIS — K5790 Diverticulosis of intestine, part unspecified, without perforation or abscess without bleeding: Secondary | ICD-10-CM | POA: Diagnosis not present

## 2019-09-09 DIAGNOSIS — I10 Essential (primary) hypertension: Secondary | ICD-10-CM | POA: Diagnosis not present

## 2019-09-09 DIAGNOSIS — M1990 Unspecified osteoarthritis, unspecified site: Secondary | ICD-10-CM | POA: Diagnosis not present

## 2019-09-09 DIAGNOSIS — Z853 Personal history of malignant neoplasm of breast: Secondary | ICD-10-CM | POA: Diagnosis not present

## 2019-09-09 DIAGNOSIS — S6992XA Unspecified injury of left wrist, hand and finger(s), initial encounter: Secondary | ICD-10-CM | POA: Diagnosis not present

## 2019-09-09 DIAGNOSIS — F419 Anxiety disorder, unspecified: Secondary | ICD-10-CM | POA: Diagnosis not present

## 2019-09-09 DIAGNOSIS — M810 Age-related osteoporosis without current pathological fracture: Secondary | ICD-10-CM | POA: Diagnosis not present

## 2019-09-09 DIAGNOSIS — K219 Gastro-esophageal reflux disease without esophagitis: Secondary | ICD-10-CM | POA: Diagnosis not present

## 2019-09-09 DIAGNOSIS — Z87891 Personal history of nicotine dependence: Secondary | ICD-10-CM | POA: Diagnosis not present

## 2019-09-09 DIAGNOSIS — W19XXXA Unspecified fall, initial encounter: Secondary | ICD-10-CM | POA: Diagnosis not present

## 2019-09-09 DIAGNOSIS — M25532 Pain in left wrist: Secondary | ICD-10-CM | POA: Diagnosis not present

## 2019-09-09 DIAGNOSIS — M19012 Primary osteoarthritis, left shoulder: Secondary | ICD-10-CM | POA: Diagnosis not present

## 2019-09-25 DIAGNOSIS — I10 Essential (primary) hypertension: Secondary | ICD-10-CM | POA: Diagnosis not present

## 2019-09-25 DIAGNOSIS — E782 Mixed hyperlipidemia: Secondary | ICD-10-CM | POA: Diagnosis not present

## 2019-09-25 DIAGNOSIS — K219 Gastro-esophageal reflux disease without esophagitis: Secondary | ICD-10-CM | POA: Diagnosis not present

## 2019-09-25 DIAGNOSIS — E876 Hypokalemia: Secondary | ICD-10-CM | POA: Diagnosis not present

## 2019-10-04 DIAGNOSIS — M25551 Pain in right hip: Secondary | ICD-10-CM | POA: Diagnosis not present

## 2019-10-04 DIAGNOSIS — J301 Allergic rhinitis due to pollen: Secondary | ICD-10-CM | POA: Diagnosis not present

## 2019-10-04 DIAGNOSIS — M4800 Spinal stenosis, site unspecified: Secondary | ICD-10-CM | POA: Diagnosis not present

## 2019-10-04 DIAGNOSIS — Z6841 Body Mass Index (BMI) 40.0 and over, adult: Secondary | ICD-10-CM | POA: Diagnosis not present

## 2019-10-04 DIAGNOSIS — R32 Unspecified urinary incontinence: Secondary | ICD-10-CM | POA: Diagnosis not present

## 2019-10-04 DIAGNOSIS — M4106 Infantile idiopathic scoliosis, lumbar region: Secondary | ICD-10-CM | POA: Diagnosis not present

## 2019-10-04 DIAGNOSIS — M7502 Adhesive capsulitis of left shoulder: Secondary | ICD-10-CM | POA: Diagnosis not present

## 2019-10-04 DIAGNOSIS — E782 Mixed hyperlipidemia: Secondary | ICD-10-CM | POA: Diagnosis not present

## 2019-10-04 DIAGNOSIS — I10 Essential (primary) hypertension: Secondary | ICD-10-CM | POA: Diagnosis not present

## 2019-10-04 DIAGNOSIS — K219 Gastro-esophageal reflux disease without esophagitis: Secondary | ICD-10-CM | POA: Diagnosis not present

## 2019-10-04 DIAGNOSIS — J4530 Mild persistent asthma, uncomplicated: Secondary | ICD-10-CM | POA: Diagnosis not present

## 2019-10-04 DIAGNOSIS — F331 Major depressive disorder, recurrent, moderate: Secondary | ICD-10-CM | POA: Diagnosis not present

## 2019-10-04 DIAGNOSIS — M1991 Primary osteoarthritis, unspecified site: Secondary | ICD-10-CM | POA: Diagnosis not present

## 2019-10-04 DIAGNOSIS — Z96653 Presence of artificial knee joint, bilateral: Secondary | ICD-10-CM | POA: Diagnosis not present

## 2019-10-04 DIAGNOSIS — H9193 Unspecified hearing loss, bilateral: Secondary | ICD-10-CM | POA: Diagnosis not present

## 2019-10-24 DIAGNOSIS — I10 Essential (primary) hypertension: Secondary | ICD-10-CM | POA: Diagnosis not present

## 2019-10-24 DIAGNOSIS — Z9181 History of falling: Secondary | ICD-10-CM | POA: Diagnosis not present

## 2019-10-24 DIAGNOSIS — F419 Anxiety disorder, unspecified: Secondary | ICD-10-CM | POA: Diagnosis not present

## 2019-11-03 DIAGNOSIS — E782 Mixed hyperlipidemia: Secondary | ICD-10-CM | POA: Diagnosis not present

## 2019-11-03 DIAGNOSIS — F331 Major depressive disorder, recurrent, moderate: Secondary | ICD-10-CM | POA: Diagnosis not present

## 2019-11-03 DIAGNOSIS — J4530 Mild persistent asthma, uncomplicated: Secondary | ICD-10-CM | POA: Diagnosis not present

## 2019-11-03 DIAGNOSIS — M4106 Infantile idiopathic scoliosis, lumbar region: Secondary | ICD-10-CM | POA: Diagnosis not present

## 2019-11-03 DIAGNOSIS — J301 Allergic rhinitis due to pollen: Secondary | ICD-10-CM | POA: Diagnosis not present

## 2019-11-03 DIAGNOSIS — H9193 Unspecified hearing loss, bilateral: Secondary | ICD-10-CM | POA: Diagnosis not present

## 2019-11-03 DIAGNOSIS — K219 Gastro-esophageal reflux disease without esophagitis: Secondary | ICD-10-CM | POA: Diagnosis not present

## 2019-11-03 DIAGNOSIS — Z6841 Body Mass Index (BMI) 40.0 and over, adult: Secondary | ICD-10-CM | POA: Diagnosis not present

## 2019-11-03 DIAGNOSIS — I10 Essential (primary) hypertension: Secondary | ICD-10-CM | POA: Diagnosis not present

## 2019-11-03 DIAGNOSIS — M25551 Pain in right hip: Secondary | ICD-10-CM | POA: Diagnosis not present

## 2019-11-03 DIAGNOSIS — R32 Unspecified urinary incontinence: Secondary | ICD-10-CM | POA: Diagnosis not present

## 2019-11-03 DIAGNOSIS — M1991 Primary osteoarthritis, unspecified site: Secondary | ICD-10-CM | POA: Diagnosis not present

## 2019-11-03 DIAGNOSIS — Z96653 Presence of artificial knee joint, bilateral: Secondary | ICD-10-CM | POA: Diagnosis not present

## 2019-11-03 DIAGNOSIS — M7502 Adhesive capsulitis of left shoulder: Secondary | ICD-10-CM | POA: Diagnosis not present

## 2019-11-03 DIAGNOSIS — M4800 Spinal stenosis, site unspecified: Secondary | ICD-10-CM | POA: Diagnosis not present

## 2019-11-10 DIAGNOSIS — M7502 Adhesive capsulitis of left shoulder: Secondary | ICD-10-CM | POA: Diagnosis not present

## 2019-11-10 DIAGNOSIS — M4800 Spinal stenosis, site unspecified: Secondary | ICD-10-CM | POA: Diagnosis not present

## 2019-11-10 DIAGNOSIS — I10 Essential (primary) hypertension: Secondary | ICD-10-CM | POA: Diagnosis not present

## 2019-11-10 DIAGNOSIS — M25551 Pain in right hip: Secondary | ICD-10-CM | POA: Diagnosis not present

## 2019-12-03 DIAGNOSIS — M25551 Pain in right hip: Secondary | ICD-10-CM | POA: Diagnosis not present

## 2019-12-03 DIAGNOSIS — H9193 Unspecified hearing loss, bilateral: Secondary | ICD-10-CM | POA: Diagnosis not present

## 2019-12-03 DIAGNOSIS — J4530 Mild persistent asthma, uncomplicated: Secondary | ICD-10-CM | POA: Diagnosis not present

## 2019-12-03 DIAGNOSIS — I10 Essential (primary) hypertension: Secondary | ICD-10-CM | POA: Diagnosis not present

## 2019-12-03 DIAGNOSIS — M4106 Infantile idiopathic scoliosis, lumbar region: Secondary | ICD-10-CM | POA: Diagnosis not present

## 2019-12-03 DIAGNOSIS — Z96653 Presence of artificial knee joint, bilateral: Secondary | ICD-10-CM | POA: Diagnosis not present

## 2019-12-03 DIAGNOSIS — Z9181 History of falling: Secondary | ICD-10-CM | POA: Diagnosis not present

## 2019-12-03 DIAGNOSIS — J301 Allergic rhinitis due to pollen: Secondary | ICD-10-CM | POA: Diagnosis not present

## 2019-12-03 DIAGNOSIS — E782 Mixed hyperlipidemia: Secondary | ICD-10-CM | POA: Diagnosis not present

## 2019-12-03 DIAGNOSIS — K219 Gastro-esophageal reflux disease without esophagitis: Secondary | ICD-10-CM | POA: Diagnosis not present

## 2019-12-03 DIAGNOSIS — F331 Major depressive disorder, recurrent, moderate: Secondary | ICD-10-CM | POA: Diagnosis not present

## 2019-12-03 DIAGNOSIS — R32 Unspecified urinary incontinence: Secondary | ICD-10-CM | POA: Diagnosis not present

## 2019-12-03 DIAGNOSIS — M7502 Adhesive capsulitis of left shoulder: Secondary | ICD-10-CM | POA: Diagnosis not present

## 2019-12-03 DIAGNOSIS — Z6841 Body Mass Index (BMI) 40.0 and over, adult: Secondary | ICD-10-CM | POA: Diagnosis not present

## 2019-12-03 DIAGNOSIS — M4800 Spinal stenosis, site unspecified: Secondary | ICD-10-CM | POA: Diagnosis not present

## 2019-12-24 DIAGNOSIS — I10 Essential (primary) hypertension: Secondary | ICD-10-CM | POA: Diagnosis not present

## 2019-12-24 DIAGNOSIS — K219 Gastro-esophageal reflux disease without esophagitis: Secondary | ICD-10-CM | POA: Diagnosis not present

## 2020-01-05 DIAGNOSIS — M25551 Pain in right hip: Secondary | ICD-10-CM | POA: Diagnosis not present

## 2020-01-05 DIAGNOSIS — I10 Essential (primary) hypertension: Secondary | ICD-10-CM | POA: Diagnosis not present

## 2020-01-05 DIAGNOSIS — M7502 Adhesive capsulitis of left shoulder: Secondary | ICD-10-CM | POA: Diagnosis not present

## 2020-01-05 DIAGNOSIS — M4800 Spinal stenosis, site unspecified: Secondary | ICD-10-CM | POA: Diagnosis not present

## 2020-01-23 DIAGNOSIS — J4530 Mild persistent asthma, uncomplicated: Secondary | ICD-10-CM | POA: Diagnosis not present

## 2020-01-23 DIAGNOSIS — I10 Essential (primary) hypertension: Secondary | ICD-10-CM | POA: Diagnosis not present

## 2020-01-23 DIAGNOSIS — E7849 Other hyperlipidemia: Secondary | ICD-10-CM | POA: Diagnosis not present

## 2020-02-09 DIAGNOSIS — E782 Mixed hyperlipidemia: Secondary | ICD-10-CM | POA: Diagnosis not present

## 2020-02-09 DIAGNOSIS — E876 Hypokalemia: Secondary | ICD-10-CM | POA: Diagnosis not present

## 2020-02-09 DIAGNOSIS — F331 Major depressive disorder, recurrent, moderate: Secondary | ICD-10-CM | POA: Diagnosis not present

## 2020-02-09 DIAGNOSIS — I10 Essential (primary) hypertension: Secondary | ICD-10-CM | POA: Diagnosis not present

## 2020-02-12 DIAGNOSIS — Z20828 Contact with and (suspected) exposure to other viral communicable diseases: Secondary | ICD-10-CM | POA: Diagnosis not present

## 2020-02-23 DIAGNOSIS — F331 Major depressive disorder, recurrent, moderate: Secondary | ICD-10-CM | POA: Diagnosis not present

## 2020-02-23 DIAGNOSIS — I10 Essential (primary) hypertension: Secondary | ICD-10-CM | POA: Diagnosis not present

## 2020-02-23 DIAGNOSIS — M4106 Infantile idiopathic scoliosis, lumbar region: Secondary | ICD-10-CM | POA: Diagnosis not present

## 2020-02-23 DIAGNOSIS — E7849 Other hyperlipidemia: Secondary | ICD-10-CM | POA: Diagnosis not present

## 2020-03-24 DIAGNOSIS — I10 Essential (primary) hypertension: Secondary | ICD-10-CM | POA: Diagnosis not present

## 2020-03-24 DIAGNOSIS — M4106 Infantile idiopathic scoliosis, lumbar region: Secondary | ICD-10-CM | POA: Diagnosis not present

## 2020-03-24 DIAGNOSIS — E7849 Other hyperlipidemia: Secondary | ICD-10-CM | POA: Diagnosis not present

## 2020-03-24 DIAGNOSIS — F331 Major depressive disorder, recurrent, moderate: Secondary | ICD-10-CM | POA: Diagnosis not present

## 2020-04-21 ENCOUNTER — Ambulatory Visit: Payer: Medicare Other | Admitting: Orthopedic Surgery

## 2020-04-21 ENCOUNTER — Encounter: Payer: Self-pay | Admitting: Orthopedic Surgery

## 2020-04-23 DIAGNOSIS — E7849 Other hyperlipidemia: Secondary | ICD-10-CM | POA: Diagnosis not present

## 2020-04-23 DIAGNOSIS — M4106 Infantile idiopathic scoliosis, lumbar region: Secondary | ICD-10-CM | POA: Diagnosis not present

## 2020-04-23 DIAGNOSIS — I10 Essential (primary) hypertension: Secondary | ICD-10-CM | POA: Diagnosis not present

## 2020-04-23 DIAGNOSIS — F331 Major depressive disorder, recurrent, moderate: Secondary | ICD-10-CM | POA: Diagnosis not present

## 2020-05-14 DIAGNOSIS — I672 Cerebral atherosclerosis: Secondary | ICD-10-CM | POA: Diagnosis not present

## 2020-05-14 DIAGNOSIS — R52 Pain, unspecified: Secondary | ICD-10-CM | POA: Diagnosis not present

## 2020-05-14 DIAGNOSIS — W19XXXA Unspecified fall, initial encounter: Secondary | ICD-10-CM | POA: Diagnosis not present

## 2020-05-14 DIAGNOSIS — G319 Degenerative disease of nervous system, unspecified: Secondary | ICD-10-CM | POA: Diagnosis not present

## 2020-05-14 DIAGNOSIS — S199XXA Unspecified injury of neck, initial encounter: Secondary | ICD-10-CM | POA: Diagnosis not present

## 2020-05-14 DIAGNOSIS — S0003XA Contusion of scalp, initial encounter: Secondary | ICD-10-CM | POA: Diagnosis not present

## 2020-05-17 DIAGNOSIS — S8000XA Contusion of unspecified knee, initial encounter: Secondary | ICD-10-CM | POA: Diagnosis not present

## 2020-05-17 DIAGNOSIS — Z6841 Body Mass Index (BMI) 40.0 and over, adult: Secondary | ICD-10-CM | POA: Diagnosis not present

## 2020-05-17 DIAGNOSIS — S0990XA Unspecified injury of head, initial encounter: Secondary | ICD-10-CM | POA: Diagnosis not present

## 2020-05-18 DIAGNOSIS — Z96652 Presence of left artificial knee joint: Secondary | ICD-10-CM | POA: Insufficient documentation

## 2020-05-20 ENCOUNTER — Ambulatory Visit (INDEPENDENT_AMBULATORY_CARE_PROVIDER_SITE_OTHER): Payer: Medicare Other | Admitting: Orthopedic Surgery

## 2020-05-20 ENCOUNTER — Ambulatory Visit: Payer: Medicare Other

## 2020-05-20 ENCOUNTER — Encounter: Payer: Self-pay | Admitting: Orthopedic Surgery

## 2020-05-20 ENCOUNTER — Other Ambulatory Visit: Payer: Self-pay

## 2020-05-20 VITALS — Ht <= 58 in | Wt 183.0 lb

## 2020-05-20 DIAGNOSIS — Z96651 Presence of right artificial knee joint: Secondary | ICD-10-CM

## 2020-05-20 DIAGNOSIS — Z96652 Presence of left artificial knee joint: Secondary | ICD-10-CM

## 2020-05-20 NOTE — Progress Notes (Signed)
Chief Complaint  Patient presents with  . Routine Post Op    TKR Lt 01/22/12, TKR Rt 05/06/04   86 year old female status post bilateral knees left knee 8 years right knee 16 years  She fell last week landed on both knees hit her head  She has bruising on both knees right eye right side of her face but says she has no visual disturbances no pain in her knees  She is ambulatory with a walker  She can extend both knees fully with good strength her knee flexion is limited by the size of the thigh but flexes well past 100 degrees bilaterally there is no tenderness the skin again is bruising surgical incisions are well-healed no instability is felt AP or mediolateral  X-rays both knees show stable prostheses with a right Stryker knee and a left Sigma knee  Follow-up as needed patient released any problems let us know  Two chronic problem stable minimal risk of morbidity

## 2020-05-25 DIAGNOSIS — E7849 Other hyperlipidemia: Secondary | ICD-10-CM | POA: Diagnosis not present

## 2020-05-25 DIAGNOSIS — I10 Essential (primary) hypertension: Secondary | ICD-10-CM | POA: Diagnosis not present

## 2020-05-25 DIAGNOSIS — F331 Major depressive disorder, recurrent, moderate: Secondary | ICD-10-CM | POA: Diagnosis not present

## 2020-05-25 DIAGNOSIS — M4106 Infantile idiopathic scoliosis, lumbar region: Secondary | ICD-10-CM | POA: Diagnosis not present

## 2020-06-08 DIAGNOSIS — I10 Essential (primary) hypertension: Secondary | ICD-10-CM | POA: Diagnosis not present

## 2020-06-08 DIAGNOSIS — Z0001 Encounter for general adult medical examination with abnormal findings: Secondary | ICD-10-CM | POA: Diagnosis not present

## 2020-06-08 DIAGNOSIS — R5381 Other malaise: Secondary | ICD-10-CM | POA: Diagnosis not present

## 2020-06-08 DIAGNOSIS — K219 Gastro-esophageal reflux disease without esophagitis: Secondary | ICD-10-CM | POA: Diagnosis not present

## 2020-06-17 DIAGNOSIS — E782 Mixed hyperlipidemia: Secondary | ICD-10-CM | POA: Diagnosis not present

## 2020-06-17 DIAGNOSIS — I10 Essential (primary) hypertension: Secondary | ICD-10-CM | POA: Diagnosis not present

## 2020-06-17 DIAGNOSIS — Z9181 History of falling: Secondary | ICD-10-CM | POA: Diagnosis not present

## 2020-06-17 DIAGNOSIS — Z0001 Encounter for general adult medical examination with abnormal findings: Secondary | ICD-10-CM | POA: Diagnosis not present

## 2020-06-23 DIAGNOSIS — E782 Mixed hyperlipidemia: Secondary | ICD-10-CM | POA: Diagnosis not present

## 2020-06-23 DIAGNOSIS — Z0001 Encounter for general adult medical examination with abnormal findings: Secondary | ICD-10-CM | POA: Diagnosis not present

## 2020-06-23 DIAGNOSIS — E876 Hypokalemia: Secondary | ICD-10-CM | POA: Diagnosis not present

## 2020-06-23 DIAGNOSIS — F331 Major depressive disorder, recurrent, moderate: Secondary | ICD-10-CM | POA: Diagnosis not present

## 2020-06-24 DIAGNOSIS — E7849 Other hyperlipidemia: Secondary | ICD-10-CM | POA: Diagnosis not present

## 2020-06-24 DIAGNOSIS — M4106 Infantile idiopathic scoliosis, lumbar region: Secondary | ICD-10-CM | POA: Diagnosis not present

## 2020-06-24 DIAGNOSIS — I10 Essential (primary) hypertension: Secondary | ICD-10-CM | POA: Diagnosis not present

## 2020-06-24 DIAGNOSIS — F331 Major depressive disorder, recurrent, moderate: Secondary | ICD-10-CM | POA: Diagnosis not present

## 2020-07-24 DIAGNOSIS — M4106 Infantile idiopathic scoliosis, lumbar region: Secondary | ICD-10-CM | POA: Diagnosis not present

## 2020-07-24 DIAGNOSIS — E7849 Other hyperlipidemia: Secondary | ICD-10-CM | POA: Diagnosis not present

## 2020-07-24 DIAGNOSIS — F331 Major depressive disorder, recurrent, moderate: Secondary | ICD-10-CM | POA: Diagnosis not present

## 2020-07-24 DIAGNOSIS — I10 Essential (primary) hypertension: Secondary | ICD-10-CM | POA: Diagnosis not present

## 2020-08-16 DIAGNOSIS — R197 Diarrhea, unspecified: Secondary | ICD-10-CM | POA: Diagnosis not present

## 2020-08-16 DIAGNOSIS — R040 Epistaxis: Secondary | ICD-10-CM | POA: Diagnosis not present

## 2020-08-16 DIAGNOSIS — F05 Delirium due to known physiological condition: Secondary | ICD-10-CM | POA: Diagnosis not present

## 2020-08-16 DIAGNOSIS — R112 Nausea with vomiting, unspecified: Secondary | ICD-10-CM | POA: Diagnosis not present

## 2020-08-16 DIAGNOSIS — R410 Disorientation, unspecified: Secondary | ICD-10-CM | POA: Diagnosis not present

## 2020-08-16 DIAGNOSIS — N3941 Urge incontinence: Secondary | ICD-10-CM | POA: Diagnosis not present

## 2020-08-16 DIAGNOSIS — R0689 Other abnormalities of breathing: Secondary | ICD-10-CM | POA: Diagnosis not present

## 2020-08-16 DIAGNOSIS — Z20822 Contact with and (suspected) exposure to covid-19: Secondary | ICD-10-CM | POA: Diagnosis not present

## 2020-08-16 DIAGNOSIS — R059 Cough, unspecified: Secondary | ICD-10-CM | POA: Diagnosis not present

## 2020-08-16 DIAGNOSIS — K5669 Other partial intestinal obstruction: Secondary | ICD-10-CM | POA: Diagnosis not present

## 2020-08-16 DIAGNOSIS — I700 Atherosclerosis of aorta: Secondary | ICD-10-CM | POA: Diagnosis not present

## 2020-08-16 DIAGNOSIS — Z87891 Personal history of nicotine dependence: Secondary | ICD-10-CM | POA: Diagnosis not present

## 2020-08-16 DIAGNOSIS — K56609 Unspecified intestinal obstruction, unspecified as to partial versus complete obstruction: Secondary | ICD-10-CM | POA: Diagnosis not present

## 2020-08-16 DIAGNOSIS — Z781 Physical restraint status: Secondary | ICD-10-CM | POA: Diagnosis not present

## 2020-08-16 DIAGNOSIS — I10 Essential (primary) hypertension: Secondary | ICD-10-CM | POA: Diagnosis not present

## 2020-08-16 DIAGNOSIS — K6389 Other specified diseases of intestine: Secondary | ICD-10-CM | POA: Diagnosis not present

## 2020-08-16 DIAGNOSIS — E860 Dehydration: Secondary | ICD-10-CM | POA: Diagnosis not present

## 2020-08-16 DIAGNOSIS — K5652 Intestinal adhesions [bands] with complete obstruction: Secondary | ICD-10-CM | POA: Diagnosis not present

## 2020-08-16 DIAGNOSIS — I472 Ventricular tachycardia: Secondary | ICD-10-CM | POA: Diagnosis not present

## 2020-08-16 DIAGNOSIS — Z79899 Other long term (current) drug therapy: Secondary | ICD-10-CM | POA: Diagnosis not present

## 2020-08-16 DIAGNOSIS — Z96653 Presence of artificial knee joint, bilateral: Secondary | ICD-10-CM | POA: Diagnosis not present

## 2020-08-16 DIAGNOSIS — D259 Leiomyoma of uterus, unspecified: Secondary | ICD-10-CM | POA: Diagnosis not present

## 2020-08-16 DIAGNOSIS — F32A Depression, unspecified: Secondary | ICD-10-CM | POA: Diagnosis not present

## 2020-08-16 DIAGNOSIS — G9341 Metabolic encephalopathy: Secondary | ICD-10-CM | POA: Diagnosis not present

## 2020-08-16 DIAGNOSIS — R109 Unspecified abdominal pain: Secondary | ICD-10-CM | POA: Diagnosis not present

## 2020-08-16 DIAGNOSIS — J45909 Unspecified asthma, uncomplicated: Secondary | ICD-10-CM | POA: Diagnosis not present

## 2020-08-16 DIAGNOSIS — N179 Acute kidney failure, unspecified: Secondary | ICD-10-CM | POA: Diagnosis not present

## 2020-08-16 DIAGNOSIS — R0902 Hypoxemia: Secondary | ICD-10-CM | POA: Diagnosis not present

## 2020-08-16 DIAGNOSIS — F419 Anxiety disorder, unspecified: Secondary | ICD-10-CM | POA: Diagnosis not present

## 2020-08-16 DIAGNOSIS — K5790 Diverticulosis of intestine, part unspecified, without perforation or abscess without bleeding: Secondary | ICD-10-CM | POA: Diagnosis not present

## 2020-08-16 DIAGNOSIS — R1110 Vomiting, unspecified: Secondary | ICD-10-CM | POA: Diagnosis not present

## 2020-08-16 DIAGNOSIS — K219 Gastro-esophageal reflux disease without esophagitis: Secondary | ICD-10-CM | POA: Diagnosis not present

## 2020-08-18 DIAGNOSIS — R4182 Altered mental status, unspecified: Secondary | ICD-10-CM | POA: Diagnosis not present

## 2020-08-18 DIAGNOSIS — Z4659 Encounter for fitting and adjustment of other gastrointestinal appliance and device: Secondary | ICD-10-CM | POA: Diagnosis not present

## 2020-08-18 DIAGNOSIS — R933 Abnormal findings on diagnostic imaging of other parts of digestive tract: Secondary | ICD-10-CM | POA: Diagnosis not present

## 2020-08-18 DIAGNOSIS — Z8719 Personal history of other diseases of the digestive system: Secondary | ICD-10-CM | POA: Diagnosis not present

## 2020-08-18 DIAGNOSIS — F05 Delirium due to known physiological condition: Secondary | ICD-10-CM | POA: Diagnosis not present

## 2020-08-18 DIAGNOSIS — K5651 Intestinal adhesions [bands], with partial obstruction: Secondary | ICD-10-CM | POA: Diagnosis not present

## 2020-08-18 DIAGNOSIS — Z87891 Personal history of nicotine dependence: Secondary | ICD-10-CM | POA: Diagnosis not present

## 2020-08-18 DIAGNOSIS — K56699 Other intestinal obstruction unspecified as to partial versus complete obstruction: Secondary | ICD-10-CM | POA: Diagnosis not present

## 2020-08-18 DIAGNOSIS — F411 Generalized anxiety disorder: Secondary | ICD-10-CM | POA: Diagnosis not present

## 2020-08-18 DIAGNOSIS — I6389 Other cerebral infarction: Secondary | ICD-10-CM | POA: Diagnosis not present

## 2020-08-18 DIAGNOSIS — R0902 Hypoxemia: Secondary | ICD-10-CM | POA: Diagnosis not present

## 2020-08-18 DIAGNOSIS — Z20822 Contact with and (suspected) exposure to covid-19: Secondary | ICD-10-CM | POA: Diagnosis not present

## 2020-08-18 DIAGNOSIS — Z48815 Encounter for surgical aftercare following surgery on the digestive system: Secondary | ICD-10-CM | POA: Diagnosis not present

## 2020-08-18 DIAGNOSIS — Z438 Encounter for attention to other artificial openings: Secondary | ICD-10-CM | POA: Diagnosis not present

## 2020-08-18 DIAGNOSIS — Z433 Encounter for attention to colostomy: Secondary | ICD-10-CM | POA: Diagnosis not present

## 2020-08-18 DIAGNOSIS — F419 Anxiety disorder, unspecified: Secondary | ICD-10-CM | POA: Diagnosis not present

## 2020-08-18 DIAGNOSIS — F329 Major depressive disorder, single episode, unspecified: Secondary | ICD-10-CM | POA: Diagnosis not present

## 2020-08-18 DIAGNOSIS — K56609 Unspecified intestinal obstruction, unspecified as to partial versus complete obstruction: Secondary | ICD-10-CM | POA: Diagnosis not present

## 2020-08-18 DIAGNOSIS — R1113 Vomiting of fecal matter: Secondary | ICD-10-CM | POA: Diagnosis not present

## 2020-08-18 DIAGNOSIS — K5669 Other partial intestinal obstruction: Secondary | ICD-10-CM | POA: Diagnosis not present

## 2020-08-18 DIAGNOSIS — Z781 Physical restraint status: Secondary | ICD-10-CM | POA: Diagnosis not present

## 2020-08-18 DIAGNOSIS — F32A Depression, unspecified: Secondary | ICD-10-CM | POA: Diagnosis not present

## 2020-08-18 DIAGNOSIS — I472 Ventricular tachycardia: Secondary | ICD-10-CM | POA: Diagnosis not present

## 2020-08-18 DIAGNOSIS — R531 Weakness: Secondary | ICD-10-CM | POA: Diagnosis not present

## 2020-08-18 DIAGNOSIS — R112 Nausea with vomiting, unspecified: Secondary | ICD-10-CM | POA: Diagnosis not present

## 2020-08-18 DIAGNOSIS — E538 Deficiency of other specified B group vitamins: Secondary | ICD-10-CM | POA: Diagnosis not present

## 2020-08-18 DIAGNOSIS — R41841 Cognitive communication deficit: Secondary | ICD-10-CM | POA: Diagnosis not present

## 2020-08-18 DIAGNOSIS — M2550 Pain in unspecified joint: Secondary | ICD-10-CM | POA: Diagnosis not present

## 2020-08-18 DIAGNOSIS — G9341 Metabolic encephalopathy: Secondary | ICD-10-CM | POA: Diagnosis not present

## 2020-08-18 DIAGNOSIS — R109 Unspecified abdominal pain: Secondary | ICD-10-CM | POA: Diagnosis not present

## 2020-08-18 DIAGNOSIS — I4510 Unspecified right bundle-branch block: Secondary | ICD-10-CM | POA: Diagnosis not present

## 2020-08-18 DIAGNOSIS — Z7401 Bed confinement status: Secondary | ICD-10-CM | POA: Diagnosis not present

## 2020-08-18 DIAGNOSIS — Z978 Presence of other specified devices: Secondary | ICD-10-CM | POA: Diagnosis not present

## 2020-08-18 DIAGNOSIS — Z96653 Presence of artificial knee joint, bilateral: Secondary | ICD-10-CM | POA: Diagnosis not present

## 2020-08-18 DIAGNOSIS — K56600 Partial intestinal obstruction, unspecified as to cause: Secondary | ICD-10-CM | POA: Diagnosis not present

## 2020-08-18 DIAGNOSIS — R197 Diarrhea, unspecified: Secondary | ICD-10-CM | POA: Diagnosis not present

## 2020-08-18 DIAGNOSIS — K5652 Intestinal adhesions [bands] with complete obstruction: Secondary | ICD-10-CM | POA: Diagnosis not present

## 2020-08-18 DIAGNOSIS — R040 Epistaxis: Secondary | ICD-10-CM | POA: Diagnosis not present

## 2020-08-18 DIAGNOSIS — R410 Disorientation, unspecified: Secondary | ICD-10-CM | POA: Diagnosis not present

## 2020-08-18 DIAGNOSIS — K5790 Diverticulosis of intestine, part unspecified, without perforation or abscess without bleeding: Secondary | ICD-10-CM | POA: Diagnosis not present

## 2020-08-18 DIAGNOSIS — D259 Leiomyoma of uterus, unspecified: Secondary | ICD-10-CM | POA: Diagnosis not present

## 2020-08-18 DIAGNOSIS — J45909 Unspecified asthma, uncomplicated: Secondary | ICD-10-CM | POA: Diagnosis not present

## 2020-08-18 DIAGNOSIS — N179 Acute kidney failure, unspecified: Secondary | ICD-10-CM | POA: Diagnosis not present

## 2020-08-18 DIAGNOSIS — M4106 Infantile idiopathic scoliosis, lumbar region: Secondary | ICD-10-CM | POA: Diagnosis not present

## 2020-08-18 DIAGNOSIS — Z9049 Acquired absence of other specified parts of digestive tract: Secondary | ICD-10-CM | POA: Diagnosis not present

## 2020-08-18 DIAGNOSIS — Z79899 Other long term (current) drug therapy: Secondary | ICD-10-CM | POA: Diagnosis not present

## 2020-08-18 DIAGNOSIS — E7849 Other hyperlipidemia: Secondary | ICD-10-CM | POA: Diagnosis not present

## 2020-08-18 DIAGNOSIS — I10 Essential (primary) hypertension: Secondary | ICD-10-CM | POA: Diagnosis not present

## 2020-08-18 DIAGNOSIS — R1900 Intra-abdominal and pelvic swelling, mass and lump, unspecified site: Secondary | ICD-10-CM | POA: Diagnosis not present

## 2020-08-18 DIAGNOSIS — K5730 Diverticulosis of large intestine without perforation or abscess without bleeding: Secondary | ICD-10-CM | POA: Diagnosis not present

## 2020-08-18 DIAGNOSIS — R262 Difficulty in walking, not elsewhere classified: Secondary | ICD-10-CM | POA: Diagnosis not present

## 2020-08-18 DIAGNOSIS — I700 Atherosclerosis of aorta: Secondary | ICD-10-CM | POA: Diagnosis not present

## 2020-08-18 DIAGNOSIS — E870 Hyperosmolality and hypernatremia: Secondary | ICD-10-CM | POA: Diagnosis not present

## 2020-08-18 DIAGNOSIS — D539 Nutritional anemia, unspecified: Secondary | ICD-10-CM | POA: Diagnosis not present

## 2020-08-18 DIAGNOSIS — R2681 Unsteadiness on feet: Secondary | ICD-10-CM | POA: Diagnosis not present

## 2020-08-18 DIAGNOSIS — M6281 Muscle weakness (generalized): Secondary | ICD-10-CM | POA: Diagnosis not present

## 2020-08-18 DIAGNOSIS — I493 Ventricular premature depolarization: Secondary | ICD-10-CM | POA: Diagnosis not present

## 2020-08-18 DIAGNOSIS — K219 Gastro-esophageal reflux disease without esophagitis: Secondary | ICD-10-CM | POA: Diagnosis not present

## 2020-08-18 DIAGNOSIS — N3941 Urge incontinence: Secondary | ICD-10-CM | POA: Diagnosis not present

## 2020-08-18 DIAGNOSIS — Z933 Colostomy status: Secondary | ICD-10-CM | POA: Diagnosis not present

## 2020-08-24 DIAGNOSIS — M4106 Infantile idiopathic scoliosis, lumbar region: Secondary | ICD-10-CM | POA: Diagnosis not present

## 2020-08-24 DIAGNOSIS — E7849 Other hyperlipidemia: Secondary | ICD-10-CM | POA: Diagnosis not present

## 2020-08-24 DIAGNOSIS — I10 Essential (primary) hypertension: Secondary | ICD-10-CM | POA: Diagnosis not present

## 2020-08-24 HISTORY — PX: EXPLORATORY LAPAROTOMY: SUR591

## 2020-09-01 ENCOUNTER — Inpatient Hospital Stay
Admission: RE | Admit: 2020-09-01 | Discharge: 2022-10-16 | Disposition: A | Discharge status: Skilled Nursing Facility | Payer: Medicare Other | Source: Ambulatory Visit | Attending: Internal Medicine | Admitting: Internal Medicine

## 2020-09-01 DIAGNOSIS — F329 Major depressive disorder, single episode, unspecified: Secondary | ICD-10-CM | POA: Diagnosis not present

## 2020-09-01 DIAGNOSIS — R2681 Unsteadiness on feet: Secondary | ICD-10-CM | POA: Diagnosis not present

## 2020-09-01 DIAGNOSIS — M6281 Muscle weakness (generalized): Secondary | ICD-10-CM | POA: Diagnosis not present

## 2020-09-01 DIAGNOSIS — F339 Major depressive disorder, recurrent, unspecified: Secondary | ICD-10-CM | POA: Diagnosis not present

## 2020-09-01 DIAGNOSIS — Z13820 Encounter for screening for osteoporosis: Principal | ICD-10-CM

## 2020-09-01 DIAGNOSIS — K219 Gastro-esophageal reflux disease without esophagitis: Secondary | ICD-10-CM | POA: Diagnosis not present

## 2020-09-01 DIAGNOSIS — Z7401 Bed confinement status: Secondary | ICD-10-CM | POA: Diagnosis not present

## 2020-09-01 DIAGNOSIS — J4540 Moderate persistent asthma, uncomplicated: Secondary | ICD-10-CM | POA: Diagnosis not present

## 2020-09-01 DIAGNOSIS — D539 Nutritional anemia, unspecified: Secondary | ICD-10-CM | POA: Diagnosis not present

## 2020-09-01 DIAGNOSIS — I10 Essential (primary) hypertension: Secondary | ICD-10-CM | POA: Diagnosis not present

## 2020-09-01 DIAGNOSIS — Z933 Colostomy status: Secondary | ICD-10-CM | POA: Diagnosis not present

## 2020-09-01 DIAGNOSIS — K56699 Other intestinal obstruction unspecified as to partial versus complete obstruction: Secondary | ICD-10-CM | POA: Diagnosis not present

## 2020-09-01 DIAGNOSIS — R531 Weakness: Secondary | ICD-10-CM | POA: Diagnosis not present

## 2020-09-01 DIAGNOSIS — Z438 Encounter for attention to other artificial openings: Secondary | ICD-10-CM | POA: Diagnosis not present

## 2020-09-01 DIAGNOSIS — Z8679 Personal history of other diseases of the circulatory system: Secondary | ICD-10-CM | POA: Diagnosis not present

## 2020-09-01 DIAGNOSIS — M2550 Pain in unspecified joint: Secondary | ICD-10-CM | POA: Diagnosis not present

## 2020-09-01 DIAGNOSIS — E43 Unspecified severe protein-calorie malnutrition: Secondary | ICD-10-CM | POA: Diagnosis not present

## 2020-09-01 DIAGNOSIS — J45909 Unspecified asthma, uncomplicated: Secondary | ICD-10-CM | POA: Diagnosis not present

## 2020-09-01 DIAGNOSIS — M5137 Other intervertebral disc degeneration, lumbosacral region: Secondary | ICD-10-CM | POA: Diagnosis not present

## 2020-09-01 DIAGNOSIS — F411 Generalized anxiety disorder: Secondary | ICD-10-CM | POA: Diagnosis not present

## 2020-09-01 DIAGNOSIS — Z48815 Encounter for surgical aftercare following surgery on the digestive system: Secondary | ICD-10-CM | POA: Diagnosis not present

## 2020-09-01 DIAGNOSIS — K56609 Unspecified intestinal obstruction, unspecified as to partial versus complete obstruction: Secondary | ICD-10-CM | POA: Diagnosis not present

## 2020-09-01 DIAGNOSIS — Z433 Encounter for attention to colostomy: Secondary | ICD-10-CM | POA: Diagnosis not present

## 2020-09-01 DIAGNOSIS — R41841 Cognitive communication deficit: Secondary | ICD-10-CM | POA: Diagnosis not present

## 2020-09-01 DIAGNOSIS — R410 Disorientation, unspecified: Secondary | ICD-10-CM | POA: Diagnosis not present

## 2020-09-01 DIAGNOSIS — J3089 Other allergic rhinitis: Secondary | ICD-10-CM | POA: Diagnosis not present

## 2020-09-01 DIAGNOSIS — Z9889 Other specified postprocedural states: Secondary | ICD-10-CM | POA: Diagnosis not present

## 2020-09-01 DIAGNOSIS — R262 Difficulty in walking, not elsewhere classified: Secondary | ICD-10-CM | POA: Diagnosis not present

## 2020-09-02 ENCOUNTER — Other Ambulatory Visit: Payer: Self-pay | Admitting: Adult Health

## 2020-09-02 ENCOUNTER — Encounter: Payer: Self-pay | Admitting: Adult Health

## 2020-09-02 ENCOUNTER — Non-Acute Institutional Stay (SKILLED_NURSING_FACILITY): Payer: Medicare Other | Admitting: Adult Health

## 2020-09-02 DIAGNOSIS — M5137 Other intervertebral disc degeneration, lumbosacral region: Secondary | ICD-10-CM

## 2020-09-02 DIAGNOSIS — Z933 Colostomy status: Secondary | ICD-10-CM | POA: Diagnosis not present

## 2020-09-02 DIAGNOSIS — K219 Gastro-esophageal reflux disease without esophagitis: Secondary | ICD-10-CM

## 2020-09-02 DIAGNOSIS — J4540 Moderate persistent asthma, uncomplicated: Secondary | ICD-10-CM | POA: Diagnosis not present

## 2020-09-02 DIAGNOSIS — I700 Atherosclerosis of aorta: Secondary | ICD-10-CM

## 2020-09-02 DIAGNOSIS — I10 Essential (primary) hypertension: Secondary | ICD-10-CM

## 2020-09-02 DIAGNOSIS — Z9889 Other specified postprocedural states: Secondary | ICD-10-CM

## 2020-09-02 DIAGNOSIS — I708 Atherosclerosis of other arteries: Secondary | ICD-10-CM

## 2020-09-02 DIAGNOSIS — F339 Major depressive disorder, recurrent, unspecified: Secondary | ICD-10-CM

## 2020-09-02 DIAGNOSIS — K56609 Unspecified intestinal obstruction, unspecified as to partial versus complete obstruction: Secondary | ICD-10-CM

## 2020-09-02 DIAGNOSIS — E876 Hypokalemia: Secondary | ICD-10-CM

## 2020-09-02 DIAGNOSIS — J3089 Other allergic rhinitis: Secondary | ICD-10-CM

## 2020-09-02 DIAGNOSIS — M51379 Other intervertebral disc degeneration, lumbosacral region without mention of lumbar back pain or lower extremity pain: Secondary | ICD-10-CM

## 2020-09-02 MED ORDER — PREGABALIN 75 MG PO CAPS
75.0000 mg | ORAL_CAPSULE | Freq: Every day | ORAL | 0 refills | Status: DC
Start: 2020-09-02 — End: 2020-09-29

## 2020-09-02 NOTE — Progress Notes (Addendum)
Location:    [place] Room Number: 144/P Place of Service:  SNF (31)   CODE STATUS: Full Code  No Known Allergies  Chief Complaint  Patient presents with  . Hospitalization Follow-up    Hospitalization Follow Up     HPI:  She is a 86 year old woman who has been hospitalized from 08-18-20 through 09-01-20. She was transferred to [facility] from an outside hospital for surgical evaluation of a bowel obstruction. Prior to her initial hospitalization she had a couple weeks of diarrhea that escalated to projectile vomiting. Upon arrival to the ED she did have some confusion. The outside CT scan did demonstrate obstruction and malignancy. She did have a NG tube placed. She did have a flex sig done however she could not tolerate the prep. On 08-24-20 she underwent an exploratory lap with a partial sigmoidectomy creation of end colostomy. She kept the NG until her colostomy started functioning and has since then been removed. She is tolerating a regular diet. She is here for short term rehab with her goal to return back home. She is having back today; and can only focus upon her pain. There are no reports of nausea or vomiting. She does have stool and gas in her colostomy bag. She will continue to be followed for her chronic illnesses including: Degenerative disc disease lumbar:    Moderate persistent asthma in adult:   Non-seasonal allergic rhinitis unspecified trigger  Past Medical History:  Diagnosis Date  . Anxiety   . Arthritis   . Asthma   . Cancer ([place])    breast  . Depression   . Diverticulosis   . GERD (gastroesophageal reflux disease)   . HTN (hypertension)   . Shortness of breath    exertion  . Sleep apnea    STOP BANG 4  . Thyroid dysfunction     Past Surgical History:  Procedure Laterality Date  . ABDOMINAL HYSTERECTOMY    . arthroscopic surgery rt knee  1997  . BREAST SURGERY  2001   LEFT/ CANCER   . broken radius ulna  1990   left  . broken  right ankle  1976  . EXPLORATORY LAPAROTOMY  08/24/2020   with partial sigmoidectomy creat of end colostomy and application of negative pressure dressing.     Marland Kitchen JOINT REPLACEMENT  right knee 2005 [place]  . left knee  2004  . rt foot bone spur removed  1992  . THYROID SURGERY  1983  . TOTAL KNEE ARTHROPLASTY  01/22/2012   Procedure: TOTAL KNEE ARTHROPLASTY;  Surgeon: Carole Civil, MD;  Location: [facility];  Service: Orthopedics;  Laterality: Left;  . TUBAL LIGATION      Social History   Socioeconomic History  . Marital status: Widowed    Spouse name: Not on file  . Number of children: Not on file  . Years of education: Not on file  . Highest education level: Not on file  Occupational History  . Not on file  Tobacco Use  . Smoking status: Former Research scientist (life sciences)  . Smokeless tobacco: Former Network engineer and Sexual Activity  . Alcohol use: No  . Drug use: No  . Sexual activity: Never  Other Topics Concern  . Not on file  Social History Narrative  . Not on file   Social Determinants of Health   Financial Resource Strain: Not on file  Food Insecurity: Not on file  Transportation Needs: Not on file  Physical Activity: Not on  file  Stress: Not on file  Social Connections: Not on file  Intimate Partner Violence: Not on file   Family History  Problem Relation Age of Onset  . Pseudochol deficiency Neg Hx   . Malignant hyperthermia Neg Hx   . Hypotension Neg Hx   . Anesthesia problems Neg Hx       VITAL SIGNS BP (!) 144/71   Pulse 100   Temp 98 F (36.7 C)   Resp 20   Ht 4\' 10"  (1.473 m)   Wt 194 lb (88 kg)   BMI 40.55 kg/m   Outpatient Encounter Medications as of 09/02/2020  Medication Sig Note  . acetaminophen (TYLENOL) 500 MG tablet Take 500 mg by mouth every 8 (eight) hours as needed.   Marland Kitchen atenolol (TENORMIN) 50 MG tablet Take 50 mg by mouth every evening.   Marland Kitchen BREO ELLIPTA 200-25 MCG/INH AEPB Inhale 1 puff into the lungs daily.   . cetirizine (ZYRTEC) 10 MG  tablet Take 10 mg by mouth at bedtime.    . chlorthalidone (HYGROTON) 25 MG tablet Take 25 mg by mouth daily.   . cholecalciferol (VITAMIN D) 1000 UNITS tablet Take 2,000 Units by mouth every morning.   . DULoxetine (CYMBALTA) 60 MG capsule Take 60 mg by mouth daily.   Marland Kitchen HYDROcodone-acetaminophen (NORCO/VICODIN) 5-325 MG tablet Take 1 tablet by mouth every 6 (six) hours as needed for moderate pain.   Marland Kitchen LORazepam (ATIVAN) 0.5 MG tablet Take 0.5 mg by mouth every 8 (eight) hours as needed. For anxiety   . melatonin 3 MG TABS tablet Take 3 mg by mouth at bedtime.   . Multiple Vitamins-Minerals (CENTRUM PO) Take by mouth.   . Multiple Vitamins-Minerals (PRESERVISION AREDS 2) CAPS Take 1 capsule by mouth in the morning and at bedtime.   . NON FORMULARY Diet: __x___ Regular, ______ NAS, _______Consistent Carbohydrate, _______NPO _____Other   . pantoprazole (PROTONIX) 40 MG tablet Take 40 mg by mouth 2 (two) times daily.   . potassium chloride SA (KLOR-CON) 20 MEQ tablet Take 40 mEq by mouth every morning.   . pregabalin (LYRICA) 75 MG capsule Take 1 capsule (75 mg total) by mouth daily.   . fluticasone (FLONASE) 50 MCG/ACT nasal spray Place 2 sprays into both nostrils daily.    No facility-administered encounter medications on file as of 09/02/2020.     SIGNIFICANT DIAGNOSTIC EXAMS  TODAY  08-21-20; ct of abdomen and pelvis:  1. Distended and fluid filled obstructed large bowel with focal transition point in the sigmoid colon, raising concern for underlying stricture or mass in this region. No evidence of pneumatosis or perforation.  2. Gastric suction tube tip terminates in the duodenum consider slight retraction  08-21-20: ct of abdomen and pelvis 1. Gallbladder/biliary layering hyperdense material in the gallbladder may reflect vicarious excretion of contrast or sludge  2. Mesentery somewhat tortuous structure that matches blood pool adjacent to the proximal SMA favored to reflect a  varicosity or vascular anastomosis 3. GI tract: colonic diverticulosis without evidence of diverticulitis  4. Vascular aortobilliac atherosclerosis  5. MSK; reverse s-shaped scoliotic curvature of the spine. L1 compression fracture status post cement augmentation  Osteopenia and poly articular degenerative changes.   LABS REVIEWED TODAY:   08-19-20 vit B12: 176 08-22-20: wbc 8.2; hgb 10.3; hct 31.4; mcv 101.4 plt 151 glucose 58; bun 10 creat 0.25; k+ 4.3; na++ 132 ca 7.4 mag 1.0 phos 1.2  08-23-20: glucose 100 bun 15; creat 0.50 k+ 4.3 na++ 133; ca  8.8  mag 2.8  08-31-20: wbc 6.0; hgb 10.7; hct 32.1; mcv 102.0 plt 288; glucose 153; bun 6; creat 0.42; k+ 3.4; na++ 134   Review of Systems  Reason unable to perform ROS: is focused on her back pain     Physical Exam Constitutional:      General: She is not in acute distress.    Appearance: She is well-developed and well-nourished. She is obese. She is not diaphoretic.  Neck:     Thyroid: No thyromegaly.  Cardiovascular:     Rate and Rhythm: Normal rate and regular rhythm.     Pulses: Normal pulses and intact distal pulses.     Heart sounds: Normal heart sounds.  Pulmonary:     Effort: Pulmonary effort is normal. No respiratory distress.     Breath sounds: Normal breath sounds.  Abdominal:     General: Bowel sounds are normal. There is no distension.     Palpations: Abdomen is soft.     Tenderness: There is no abdominal tenderness.     Comments: Colostomy present with stool and gas in bag    Musculoskeletal:     Cervical back: Neck supple.     Right lower leg: Edema present.     Left lower leg: Edema present.     Comments: Trace bilateral lower extremity edema Is able to move all extremities Has scoliosis   Lymphadenopathy:     Cervical: No cervical adenopathy.  Skin:    General: Skin is warm and dry.     Comments: Abdominal incisional line with staples intact   Neurological:     Mental Status: She is alert. Mental status is  at baseline.  Psychiatric:        Mood and Affect: Mood and affect and mood normal.       ASSESSMENT/ PLAN:  TODAY  1. Large bowel obstruction/status post exploratory lap/status post colostomy. Is stable will continue therapy as directed to improve upon her independence with her adls. Has vicodin 5/325 mg every 6 hours as needed will monitor her status.   2. Degenerative disc disease lumbar: is without change will continue lyrica 75 mg nightly cymbalta 60 mg daily   3. Moderate persistent asthma in adult: is stable will continue breo ellipta 200/25 mcg daily   4. Non-seasonal allergic rhinitis unspecified trigger: is stable will continue zyrtec 10 mg daily flonase daily   5. GERD without esophagitis: is stable will continue protonix 40 mg twice daily   6. Essential hypertension: is stable 144/71 will continue tenormin 50 mg daily hygroton 25 mg daily   7. Hypokalemia: k+ 3.4 will continue k+ 40 meq daily   8. Major depression recurrent chronic: is stable will continue cymbalta 60 mg daily melatonin 3 mg nightly and has ativan 0.5 mg every 8 hours as needed   9. Aorto-iliac atherosclerosis: will monitor    Will check cbc; cmp   MD is aware of resident's narcotic use and is in agreement with current plan of care. We will attempt to wean resident as appropriate.  Ok Edwards NP [facility]  Contact [phone] Monday through Friday 8am- 5pm  After hours call [phone]

## 2020-09-03 ENCOUNTER — Encounter: Payer: Self-pay | Admitting: Internal Medicine

## 2020-09-03 ENCOUNTER — Non-Acute Institutional Stay (SKILLED_NURSING_FACILITY): Payer: Medicare Other | Admitting: Internal Medicine

## 2020-09-03 ENCOUNTER — Encounter: Payer: Self-pay | Admitting: Adult Health

## 2020-09-03 DIAGNOSIS — Z8679 Personal history of other diseases of the circulatory system: Secondary | ICD-10-CM | POA: Diagnosis not present

## 2020-09-03 DIAGNOSIS — F339 Major depressive disorder, recurrent, unspecified: Secondary | ICD-10-CM | POA: Insufficient documentation

## 2020-09-03 DIAGNOSIS — K219 Gastro-esophageal reflux disease without esophagitis: Secondary | ICD-10-CM | POA: Insufficient documentation

## 2020-09-03 DIAGNOSIS — D539 Nutritional anemia, unspecified: Secondary | ICD-10-CM | POA: Diagnosis not present

## 2020-09-03 DIAGNOSIS — J45909 Unspecified asthma, uncomplicated: Secondary | ICD-10-CM | POA: Insufficient documentation

## 2020-09-03 DIAGNOSIS — J3089 Other allergic rhinitis: Secondary | ICD-10-CM | POA: Insufficient documentation

## 2020-09-03 DIAGNOSIS — E876 Hypokalemia: Secondary | ICD-10-CM | POA: Insufficient documentation

## 2020-09-03 DIAGNOSIS — K56609 Unspecified intestinal obstruction, unspecified as to partial versus complete obstruction: Secondary | ICD-10-CM | POA: Diagnosis not present

## 2020-09-03 DIAGNOSIS — Z933 Colostomy status: Secondary | ICD-10-CM | POA: Insufficient documentation

## 2020-09-03 DIAGNOSIS — I10 Essential (primary) hypertension: Secondary | ICD-10-CM | POA: Insufficient documentation

## 2020-09-03 DIAGNOSIS — Z9889 Other specified postprocedural states: Secondary | ICD-10-CM | POA: Insufficient documentation

## 2020-09-03 DIAGNOSIS — I700 Atherosclerosis of aorta: Secondary | ICD-10-CM | POA: Insufficient documentation

## 2020-09-03 NOTE — Progress Notes (Signed)
NURSING HOME LOCATION:  [place] ROOM NUMBER: 144/P   CODE STATUS: Full Code   PCP: Caryl Bis, MD    This is a comprehensive admission note to [facility] performed on this date less than 30 days from date of admission. Included are preadmission medical/surgical history; reconciled medication list; family history; social history and comprehensive review of systems.  Corrections and additions to the records were documented. Comprehensive physical exam was also performed. Additionally a clinical summary was entered for each active diagnosis pertinent to this admission in the Problem List to enhance continuity of care.  HPI: Patient was hospitalized 11/24-12/03/2020 with partial intestinal obstruction.  She presented to [facility] ED on 11/24 as a transfer from an outside hospital for surgical evaluation of bowel obstruction.  She had presented to that hospital with chief complaint of "feeling sick for several weeks" associated with diarrhea.  Symptoms progressed to include projectile vomiting over the last few days prior to presentation.  At the outside hospital white count was 6100 and hemoglobin 11.5.  Despite the significant history; there were no electrolyte derangements.  CT of the abdomen/pelvis revealed obstruction in the sigmoid colon concerning for mass or lesion. Upon arrival to the [facility] ED she was noted to be hemodynamically stable and afebrile.  Altered mental status was present.  On exam she did have significant abdominal distention..  The outside hospital imaging was reviewed and partial SBO was confirmed, possibly due to malignancy.  Emergency General Surgery service was consulted.  NG tube was placed for gastric decompression; placement was associated with high output.  GI planned flexible sigmoidoscopy on 11/26; however, the patient was unable to tolerate the bowel prep and the procedure was deferred.  Despite ongoing NG tube decompression; there was no return of  bowel function.  Endoscopic procedures were deferred as without adequate prep it was felt that these would be nondiagnostic.  Mental status changes progressed and course was associated with AKI & subsequently leukocytosis.  Exploratory laparotomy with partial sigmoidectomy; creation of end colostomy; and application of negative pressure incisional dressing was completed  for sigmoid stricture and complete large bowel obstruction 11/30.  There were no perioperative complications other short episode of V Tach intraoperatively. The patient was extubated immediately postop and transferred to PACU. Cardiac monitoring was continued because of that short episode of V. tach intraoperatively; but there was no recurrence. Imaging to evaluate the AMS was nondiagnostic. Fluid resuscitation and bolusing was necessary for low urine output. NG was able to be removed and diet was advanced from clear liquids to regular diet.  This was associated with increasing ostomy output.  The incisional negative pressure dressing was removed on postop day #5. PT evaluated the patient and felt that she would require rehab in SNF. Wound Care Nurse at the SNF would determine exact date of surgical staple removal which was recommended during the period 12/10-12/14.  Narcotic medications were prescribed with weaning instructions.  Postop follow-up with EGS was to be in 2-3 weeks post discharge.  Past medical and surgical history: Includes history of thyroid disorder, sleep apnea, essential hypertension, GERD, history of B12 deficiency, diverticulosis, depression, history of breast cancer, and history of asthma. Surgeries and procedures include abdominal hysterectomy, arthroscopic knee surgery, left breast surgery, and history of thyroid surgery.  Social history: Nondrinker, former smoker.  Family history: Noncontributory due to age.  She probably stated that she had 5 children, 10 grandchildren, and 16 great-grandchildren.   Review of  systems: At  this time she denies any abdominal pain.  She also denies any palpitations despite the history of V. tach.  Her only complaint is being tired after sitting in the wheelchair for extended period of time.  Also she describes feeling"cold" without other constitutional symptoms.  Constitutional: No fever, significant weight change  Eyes: No redness, discharge, pain, vision change ENT/mouth: No nasal congestion, purulent discharge, earache, change in hearing, sore throat  Cardiovascular: No chest pain, palpitations, paroxysmal nocturnal dyspnea, claudication, edema  Respiratory: No cough, sputum production, hemoptysis, significant snoring, apnea  Gastrointestinal: No heartburn, dysphagia, abdominal pain, nausea /vomiting, rectal bleeding, melena Genitourinary: No dysuria, hematuria, pyuria, incontinence, nocturia Musculoskeletal: No joint stiffness, joint swelling, pain Dermatologic: No rash, pruritus, change in appearance of skin Neurologic: No dizziness, headache, syncope, seizures, numbness, tingling Psychiatric: No significant anxiety, depression, insomnia, anorexia Endocrine: No change in hair/skin/nails, excessive thirst, excessive hunger, excessive urination  Hematologic/lymphatic: No significant bruising, lymphadenopathy, abnormal bleeding Allergy/immunology: No itchy/watery eyes, significant sneezing, urticaria, angioedema  Physical exam:  Pertinent or positive findings: She is morbidly obese.  She required total assist moving about in bed.  She is markedly hard of hearing.  The left pupil appears slightly eccentrically located.  She has complete dentures.  Heart sounds are distant.  Abdomen is protuberant.  There is ostomy centrally.  Pedal pulses are decreased.  She has trace edema.  DIP osteoarthritic changes are present.  Strength to opposition is fair except in the left upper extremity which is weaker.  General appearance: no acute distress, increased work of breathing is  present.   Lymphatic: No lymphadenopathy about the head, neck, axilla. Eyes: No conjunctival inflammation or lid edema is present. There is no scleral icterus. Ears:  External ear exam shows no significant lesions or deformities.   Nose:  External nasal examination shows no deformity or inflammation. Nasal mucosa are pink and moist without lesions, exudates Oral exam: Lips and gums are healthy appearing.There is no oropharyngeal erythema or exudate. Neck:  No thyromegaly, masses, tenderness noted.    Heart:  No gallop, murmur, click, rub.  Lungs: without wheezes, rhonchi, rales, rubs. Abdomen: Bowel sounds are normal.  Abdomen is soft and nontender with no organomegaly, hernias, masses. GU: Deferred  Extremities:  No cyanosis, clubbing. Neurologic exam: Balance, Rhomberg, finger to nose testing could not be completed due to clinical state Skin: Warm & dry w/o tenting. No significant lesions or rash.  See clinical summary under each active problem in the Problem List with associated updated therapeutic plan

## 2020-09-03 NOTE — Assessment & Plan Note (Addendum)
Wound care monitor at SNF with staple removal as per Wound Care Nurse during 12/10-12/14/2021 span as clinically indicated.

## 2020-09-03 NOTE — Patient Instructions (Signed)
See assessment and plan under each diagnosis in the problem list and acutely for this visit 

## 2020-09-03 NOTE — Assessment & Plan Note (Addendum)
Probably unable to absorb B12 orally.  Parenteral B12 weekly x4 weeks then monthly thereafter with follow-up B12 level in 8-12 weeks.

## 2020-09-06 ENCOUNTER — Encounter: Payer: Self-pay | Admitting: Adult Health

## 2020-09-06 ENCOUNTER — Non-Acute Institutional Stay (SKILLED_NURSING_FACILITY): Payer: Medicare Other | Admitting: Adult Health

## 2020-09-06 ENCOUNTER — Encounter (HOSPITAL_COMMUNITY)
Admission: RE | Admit: 2020-09-06 | Discharge: 2020-09-06 | Disposition: A | Discharge status: Home / Self Care | Payer: Medicare Other | Source: Skilled Nursing Facility | Attending: Adult Health | Admitting: Adult Health

## 2020-09-06 DIAGNOSIS — Z933 Colostomy status: Secondary | ICD-10-CM | POA: Diagnosis not present

## 2020-09-06 DIAGNOSIS — E43 Unspecified severe protein-calorie malnutrition: Secondary | ICD-10-CM | POA: Diagnosis not present

## 2020-09-06 DIAGNOSIS — I10 Essential (primary) hypertension: Secondary | ICD-10-CM | POA: Insufficient documentation

## 2020-09-06 DIAGNOSIS — Z9889 Other specified postprocedural states: Secondary | ICD-10-CM | POA: Diagnosis not present

## 2020-09-06 LAB — CBC
HCT: 36.2 % (ref 36.0–46.0)
Hemoglobin: 11.6 g/dL — ABNORMAL LOW (ref 12.0–15.0)
MCH: 33.6 pg (ref 26.0–34.0)
MCHC: 32 g/dL (ref 30.0–36.0)
MCV: 104.9 fL — ABNORMAL HIGH (ref 80.0–100.0)
Platelets: 340 10*3/uL (ref 150–400)
RBC: 3.45 MIL/uL — ABNORMAL LOW (ref 3.87–5.11)
RDW: 12.9 % (ref 11.5–15.5)
WBC: 6.3 10*3/uL (ref 4.0–10.5)
nRBC: 0 % (ref 0.0–0.2)

## 2020-09-06 LAB — COMPREHENSIVE METABOLIC PANEL
ALT: 18 U/L (ref 0–44)
AST: 23 U/L (ref 15–41)
Albumin: 2.6 g/dL — ABNORMAL LOW (ref 3.5–5.0)
Alkaline Phosphatase: 56 U/L (ref 38–126)
Anion gap: 7 (ref 5–15)
BUN: 10 mg/dL (ref 8–23)
CO2: 28 mmol/L (ref 22–32)
Calcium: 9.2 mg/dL (ref 8.9–10.3)
Chloride: 98 mmol/L (ref 98–111)
Creatinine, Ser: 0.61 mg/dL (ref 0.44–1.00)
GFR, Estimated: >60 mL/min (ref >60)
Glucose, Bld: 98 mg/dL (ref 70–99)
Potassium: 3.6 mmol/L (ref 3.5–5.1)
Sodium: 133 mmol/L — ABNORMAL LOW (ref 135–145)
Total Bilirubin: 0.6 mg/dL (ref 0.3–1.2)
Total Protein: 5.4 g/dL — ABNORMAL LOW (ref 6.5–8.1)

## 2020-09-06 NOTE — Progress Notes (Signed)
Location:  [place] Room Number: 144/P Place of Service:  SNF (31)   CODE STATUS: Full Code  No Known Allergies  Chief Complaint  Patient presents with  . Acute Visit    Labs    HPI:  She has ordered vicodin 5/325 mg every hours as needed for pain; due to her surgery. She has not used this medication in the past 3 days. There are no reports of pain present. There are no reports of changes in appetite. She does pick at her colostomy and incision line. There are no report of fevers present. Her albumin is 2.6; will need supplements to help with wound healing.   Past Medical History:  Diagnosis Date  . Anxiety   . Arthritis   . Asthma   . Cancer ([place])    breast  . Depression   . Diverticulosis   . GERD (gastroesophageal reflux disease)   . HTN (hypertension)   . Shortness of breath    exertion  . Sleep apnea    STOP BANG 4  . Thyroid dysfunction     Past Surgical History:  Procedure Laterality Date  . ABDOMINAL HYSTERECTOMY    . arthroscopic surgery rt knee  1997  . BREAST SURGERY  2001   LEFT/ CANCER   . broken radius ulna  1990   left  . broken right ankle  1976  . EXPLORATORY LAPAROTOMY  08/24/2020   with partial sigmoidectomy creat of end colostomy and application of negative pressure dressing.     Marland Kitchen JOINT REPLACEMENT  right knee 2005 [place]  . left knee  2004  . rt foot bone spur removed  1992  . THYROID SURGERY  1983  . TOTAL KNEE ARTHROPLASTY  01/22/2012   Procedure: TOTAL KNEE ARTHROPLASTY;  Surgeon: Carole Civil, MD;  Location: [facility];  Service: Orthopedics;  Laterality: Left;  . TUBAL LIGATION      Social History   Socioeconomic History  . Marital status: Widowed    Spouse name: Not on file  . Number of children: Not on file  . Years of education: Not on file  . Highest education level: Not on file  Occupational History  . Not on file  Tobacco Use  . Smoking status: Former Research scientist (life sciences)  . Smokeless tobacco: Former  Network engineer and Sexual Activity  . Alcohol use: No  . Drug use: No  . Sexual activity: Never  Other Topics Concern  . Not on file  Social History Narrative  . Not on file   Social Determinants of Health   Financial Resource Strain: Not on file  Food Insecurity: Not on file  Transportation Needs: Not on file  Physical Activity: Not on file  Stress: Not on file  Social Connections: Not on file  Intimate Partner Violence: Not on file   Family History  Problem Relation Age of Onset  . Pseudochol deficiency Neg Hx   . Malignant hyperthermia Neg Hx   . Hypotension Neg Hx   . Anesthesia problems Neg Hx       VITAL SIGNS BP 123/67   Pulse 73   Temp 97.7 F (36.5 C)   Resp 16   Ht '4\' 10"'  (1.473 m)   Wt 185 lb 3.2 oz (84 kg)   SpO2 94%   BMI 38.71 kg/m   Outpatient Encounter Medications as of 09/06/2020  Medication Sig  . acetaminophen (TYLENOL) 500 MG tablet Take 500 mg by mouth every 8 (eight) hours  as needed.  . Amino Acids-Protein Hydrolys (FEEDING SUPPLEMENT, PRO-STAT 64,) LIQD Take 30 mLs by mouth in the morning and at bedtime.  Marland Kitchen atenolol (TENORMIN) 50 MG tablet Take 50 mg by mouth every evening.  Marland Kitchen BREO ELLIPTA 200-25 MCG/INH AEPB Inhale 1 puff into the lungs daily.  . cetirizine (ZYRTEC) 10 MG tablet Take 10 mg by mouth at bedtime.   . chlorthalidone (HYGROTON) 25 MG tablet Take 25 mg by mouth daily.  . cholecalciferol (VITAMIN D) 1000 UNITS tablet Take 2,000 Units by mouth every morning.  . Cyanocobalamin 1000 MCG/ML KIT Inject as directed. Once a day on Saturday from 09/04/2020-10/02/2020. Then take one injection once a month starting 11/04/2020  . DULoxetine (CYMBALTA) 60 MG capsule Take 60 mg by mouth daily.  Marland Kitchen LORazepam (ATIVAN) 0.5 MG tablet Take 0.5 mg by mouth every 8 (eight) hours as needed. For anxiety  . melatonin 3 MG TABS tablet Take 3 mg by mouth at bedtime.  . Multiple Vitamins-Minerals (CENTRUM PO) Take by mouth.  . Multiple Vitamins-Minerals  (PRESERVISION AREDS 2) CAPS Take 1 capsule by mouth in the morning and at bedtime.  . NON FORMULARY Diet: __x___ Regular, ______ NAS, _______Consistent Carbohydrate, _______NPO _____Other  . pantoprazole (PROTONIX) 40 MG tablet Take 40 mg by mouth 2 (two) times daily.  . potassium chloride SA (KLOR-CON) 20 MEQ tablet Take 40 mEq by mouth every morning.  . pregabalin (LYRICA) 75 MG capsule Take 1 capsule (75 mg total) by mouth daily.  . [DISCONTINUED] HYDROcodone-acetaminophen (NORCO/VICODIN) 5-325 MG tablet Take 1 tablet by mouth every 6 (six) hours as needed for moderate pain.   No facility-administered encounter medications on file as of 09/06/2020.     SIGNIFICANT DIAGNOSTIC EXAMS  PREVIOUS   08-21-20; ct of abdomen and pelvis:  1. Distended and fluid filled obstructed large bowel with focal transition point in the sigmoid colon, raising concern for underlying stricture or mass in this region. No evidence of pneumatosis or perforation.  2. Gastric suction tube tip terminates in the duodenum consider slight retraction  08-21-20: ct of abdomen and pelvis 1. Gallbladder/biliary layering hyperdense material in the gallbladder may reflect vicarious excretion of contrast or sludge  2. Mesentery somewhat tortuous structure that matches blood pool adjacent to the proximal SMA favored to reflect a varicosity or vascular anastomosis 3. GI tract: colonic diverticulosis without evidence of diverticulitis  4. Vascular aortobilliac atherosclerosis  5. MSK; reverse s-shaped scoliotic curvature of the spine. L1 compression fracture status post cement augmentation  Osteopenia and poly articular degenerative changes.   NO NEW LABS.   LABS REVIEWED PREVIOUS  08-19-20 vit B12: 176 08-22-20: wbc 8.2; hgb 10.3; hct 31.4; mcv 101.4 plt 151 glucose 58; bun 10 creat 0.25; k+ 4.3; na++ 132 ca 7.4 mag 1.0 phos 1.2  08-23-20: glucose 100 bun 15; creat 0.50 k+ 4.3 na++ 133; ca 8.8  mag 2.8  08-31-20: wbc  6.0; hgb 10.7; hct 32.1; mcv 102.0 plt 288; glucose 153; bun 6; creat 0.42; k+ 3.4; na++ 134   TODAY   09-06-20: wbc 6.3; hgb 11.4 hct 36.2 mcv 104.9; plt 340; glucose 98 bun 10; creat 0.61; k+ 3.6; na++ 133; ca 9.2 liver normal albumin 2.6    Review of Systems  Unable to perform ROS: Dementia (unable to fully participate )   Physical Exam Constitutional:      General: She is not in acute distress.    Appearance: She is well-developed and well-nourished. She is obese. She is not diaphoretic.  Neck:     Thyroid: No thyromegaly.  Cardiovascular:     Rate and Rhythm: Normal rate and regular rhythm.     Pulses: Intact distal pulses.     Heart sounds: Normal heart sounds.  Pulmonary:     Effort: Pulmonary effort is normal. No respiratory distress.     Breath sounds: Normal breath sounds.  Abdominal:     General: Bowel sounds are normal. There is no distension.     Palpations: Abdomen is soft.     Tenderness: There is no abdominal tenderness.     Comments: Colostomy present with stool and gas in bag     Musculoskeletal:     Cervical back: Neck supple.     Right lower leg: Edema present.     Left lower leg: Edema present.     Comments: Trace bilateral lower extremity edema Is able to move all extremities Has scoliosis    Lymphadenopathy:     Cervical: No cervical adenopathy.  Skin:    General: Skin is warm and dry.  Neurological:     Mental Status: She is alert. Mental status is at baseline.  Psychiatric:        Mood and Affect: Mood and affect and mood normal.     ASSESSMENT/ PLAN:  TODAY  1. Colostomy status 2. Status post exploratory laparotomy  3. Protein calorie malnutrition  Will stop vicodin for non use Will begin prostat 30 cc twice daily   will monitor her status.    MD is aware of resident's narcotic use and is in agreement with current plan of care. We will attempt to wean resident as appropriate.  Ok Edwards NP [facility] Adult Medicine  Contact  [phone] Monday through Friday 8am- 5pm  After hours call [phone]

## 2020-09-07 ENCOUNTER — Encounter: Payer: Self-pay | Admitting: Adult Health

## 2020-09-07 ENCOUNTER — Non-Acute Institutional Stay: Payer: Self-pay | Admitting: Adult Health

## 2020-09-07 DIAGNOSIS — F339 Major depressive disorder, recurrent, unspecified: Secondary | ICD-10-CM

## 2020-09-07 DIAGNOSIS — M5137 Other intervertebral disc degeneration, lumbosacral region: Secondary | ICD-10-CM | POA: Diagnosis not present

## 2020-09-07 NOTE — Progress Notes (Signed)
Location:  [place] Room Number: 144-P Place of Service:  SNF (31)   CODE STATUS: Full Code  No Known Allergies  Chief Complaint  Patient presents with   Acute Visit    Medication review     HPI:  She is presently taking cymbalta 60 mg daily lyrica 75 mg daily and ativan 0.5 mg every 8 hours as needed. She has not utilized her ativan since her admission to this facility. Will stop this medication. She denies any uncontrolled pain; no reports of constipation; no reports of insomnia. The care team feels as though she is benefiting from the cymbalta and lyrica and stopping these medications will cause her harm.   Past Medical History:  Diagnosis Date   Anxiety    Arthritis    Asthma    Cancer ([place])    breast   Depression    Diverticulosis    GERD (gastroesophageal reflux disease)    HTN (hypertension)    Shortness of breath    exertion   Sleep apnea    STOP BANG 4   Thyroid dysfunction     Past Surgical History:  Procedure Laterality Date   ABDOMINAL HYSTERECTOMY     arthroscopic surgery rt knee  1997   BREAST SURGERY  2001   LEFT/ CANCER    broken radius ulna  1990   left   broken right ankle  1976   EXPLORATORY LAPAROTOMY  08/24/2020   with partial sigmoidectomy creat of end colostomy and application of negative pressure dressing.      JOINT REPLACEMENT  right knee 2005 [place]   left knee  2004   rt foot bone spur removed  [place]  01/22/2012   Procedure: TOTAL KNEE ARTHROPLASTY;  Surgeon: Carole Civil, MD;  Location: [facility];  Service: Orthopedics;  Laterality: Left;   TUBAL LIGATION      Social History   Socioeconomic History   Marital status: Widowed    Spouse name: Not on file   Number of children: Not on file   Years of education: Not on file   Highest education level: Not on file  Occupational History   Not on file  Tobacco Use   Smoking  status: Former Smoker   Smokeless tobacco: Former Counsellor Use: Never used  Substance and Sexual Activity   Alcohol use: No   Drug use: No   Sexual activity: Never  Other Topics Concern   Not on file  Social History Narrative   Not on file   Social Determinants of Health   Financial Resource Strain: Not on file  Food Insecurity: Not on file  Transportation Needs: Not on file  Physical Activity: Not on file  Stress: Not on file  Social Connections: Not on file  Intimate Partner Violence: Not on file   Family History  Problem Relation Age of Onset   Pseudochol deficiency Neg Hx    Malignant hyperthermia Neg Hx    Hypotension Neg Hx    Anesthesia problems Neg Hx       VITAL SIGNS BP 112/62    Pulse 81    Temp 98.2 F (36.8 C)    Resp 20    Ht '4\' 10"'  (1.473 m)    Wt 185 lb 3.2 oz (84 kg)    SpO2 99%    BMI 38.71 kg/m   Outpatient Encounter Medications as of  09/07/2020  Medication Sig   acetaminophen (TYLENOL) 500 MG tablet Take 500 mg by mouth every 8 (eight) hours as needed.   Amino Acids-Protein Hydrolys (FEEDING SUPPLEMENT, PRO-STAT 64,) LIQD Take 30 mLs by mouth in the morning and at bedtime.   atenolol (TENORMIN) 50 MG tablet Take 50 mg by mouth every evening.   BREO ELLIPTA 200-25 MCG/INH AEPB Inhale 1 puff into the lungs daily.   cetirizine (ZYRTEC) 10 MG tablet Take 10 mg by mouth at bedtime.    chlorthalidone (HYGROTON) 25 MG tablet Take 25 mg by mouth daily.   cholecalciferol (VITAMIN D) 1000 UNITS tablet Take 2,000 Units by mouth every morning.   Cyanocobalamin 1000 MCG/ML KIT Inject as directed. Once a day on Saturday from 09/04/2020-10/02/2020. Then take one injection once a month starting 11/04/2020   DULoxetine (CYMBALTA) 60 MG capsule Take 60 mg by mouth daily.   LORazepam (ATIVAN) 0.5 MG tablet Take 0.5 mg by mouth every 8 (eight) hours as needed. For anxiety   melatonin 3 MG TABS tablet Take 3 mg by mouth at bedtime.    Multiple Vitamins-Minerals (CENTRUM PO) Take by mouth.   Multiple Vitamins-Minerals (PRESERVISION AREDS 2) CAPS Take 1 capsule by mouth in the morning and at bedtime.   NON FORMULARY Diet: __x___ Regular, ______ NAS, _______Consistent Carbohydrate, _______NPO _____Other   pantoprazole (PROTONIX) 40 MG tablet Take 40 mg by mouth 2 (two) times daily.   potassium chloride SA (KLOR-CON) 20 MEQ tablet Take 40 mEq by mouth every morning.   pregabalin (LYRICA) 75 MG capsule Take 1 capsule (75 mg total) by mouth daily.   No facility-administered encounter medications on file as of 09/07/2020.     SIGNIFICANT DIAGNOSTIC EXAMS   PREVIOUS   08-21-20; ct of abdomen and pelvis:  1. Distended and fluid filled obstructed large bowel with focal transition point in the sigmoid colon, raising concern for underlying stricture or mass in this region. No evidence of pneumatosis or perforation.  2. Gastric suction tube tip terminates in the duodenum consider slight retraction  08-21-20: ct of abdomen and pelvis 1. Gallbladder/biliary layering hyperdense material in the gallbladder may reflect vicarious excretion of contrast or sludge  2. Mesentery somewhat tortuous structure that matches blood pool adjacent to the proximal SMA favored to reflect a varicosity or vascular anastomosis 3. GI tract: colonic diverticulosis without evidence of diverticulitis  4. Vascular aortobilliac atherosclerosis  5. MSK; reverse s-shaped scoliotic curvature of the spine. L1 compression fracture status post cement augmentation  Osteopenia and poly articular degenerative changes.   NO NEW LABS.   LABS REVIEWED PREVIOUS  08-19-20 vit B12: 176 08-22-20: wbc 8.2; hgb 10.3; hct 31.4; mcv 101.4 plt 151 glucose 58; bun 10 creat 0.25; k+ 4.3; na++ 132 ca 7.4 mag 1.0 phos 1.2  08-23-20: glucose 100 bun 15; creat 0.50 k+ 4.3 na++ 133; ca 8.8  mag 2.8  08-31-20: wbc 6.0; hgb 10.7; hct 32.1; mcv 102.0 plt 288; glucose 153;  bun 6; creat 0.42; k+ 3.4; na++ 134  09-06-20: wbc 6.3; hgb 11.4 hct 36.2 mcv 104.9; plt 340; glucose 98 bun 10; creat 0.61; k+ 3.6; na++ 133; ca 9.2 liver normal albumin 2.6  NO NEW LABS.    Review of Systems  Unable to perform ROS: Dementia (unable to participate )   Physical Exam Constitutional:      General: She is not in acute distress.    Appearance: She is well-developed and well-nourished. She is obese. She is not diaphoretic.  Neck:  Thyroid: No thyromegaly.  Cardiovascular:     Rate and Rhythm: Normal rate and regular rhythm.     Pulses: Intact distal pulses.     Heart sounds: Normal heart sounds.  Pulmonary:     Effort: Pulmonary effort is normal. No respiratory distress.     Breath sounds: Normal breath sounds.  Abdominal:     General: Bowel sounds are normal. There is no distension.     Palpations: Abdomen is soft.     Tenderness: There is no abdominal tenderness.     Comments:  Colostomy present with stool and gas in bag      Musculoskeletal:     Cervical back: Neck supple.     Right lower leg: Edema present.     Left lower leg: Edema present.     Comments: Trace bilateral lower extremity edema Is able to move all extremities Has scoliosis     Lymphadenopathy:     Cervical: No cervical adenopathy.  Skin:    General: Skin is warm and dry.     Comments: Abdominal incisional line intact   Neurological:     Mental Status: She is alert. Mental status is at baseline.  Psychiatric:        Mood and Affect: Mood and affect and mood normal.      ASSESSMENT/ PLAN:  TODAY  1. Major depression chronic recurrent 2. Degenerative disc disease lumbar spine  Will stop ativan due to nonuse Will continue cymbalta 60 mg daily  Will continue lyrica 75 mg daily  Will monitor her status.   MD is aware of resident's narcotic use and is in agreement with current plan of care. We will attempt to wean resident as appropriate.  Ok Edwards NP [facility] Adult Medicine   Contact [phone] Monday through Friday 8am- 5pm  After hours call [phone]

## 2020-09-08 ENCOUNTER — Non-Acute Institutional Stay (SKILLED_NURSING_FACILITY): Payer: Medicare Other | Admitting: Adult Health

## 2020-09-08 ENCOUNTER — Encounter: Payer: Self-pay | Admitting: Adult Health

## 2020-09-08 DIAGNOSIS — J4540 Moderate persistent asthma, uncomplicated: Secondary | ICD-10-CM | POA: Diagnosis not present

## 2020-09-08 DIAGNOSIS — M5137 Other intervertebral disc degeneration, lumbosacral region: Secondary | ICD-10-CM

## 2020-09-08 DIAGNOSIS — Z933 Colostomy status: Secondary | ICD-10-CM | POA: Diagnosis not present

## 2020-09-08 DIAGNOSIS — K56609 Unspecified intestinal obstruction, unspecified as to partial versus complete obstruction: Secondary | ICD-10-CM

## 2020-09-08 DIAGNOSIS — M51379 Other intervertebral disc degeneration, lumbosacral region without mention of lumbar back pain or lower extremity pain: Secondary | ICD-10-CM

## 2020-09-08 DIAGNOSIS — E43 Unspecified severe protein-calorie malnutrition: Secondary | ICD-10-CM | POA: Insufficient documentation

## 2020-09-08 NOTE — Progress Notes (Signed)
Location:  [place] Room Number: 144/P Place of Service:  SNF (31)   CODE STATUS: Full Code  No Known Allergies  Chief Complaint  Patient presents with  . Short Term Rehab (STR)         Large bowel obstruction/status post exploratory lap/status post colostomy:   Degenerative disc disease lumbar: Moderate persistent asthma in adult:   weekly follow up for the first 30 days post hospitalization.       HPI:  She is a 86 year old short term patient of this facility being seen for the management of her chronic illnesses:  Large bowel obstruction/status post exploratory lap/status post colostomy:   Degenerative disc disease lumbar: Moderate persistent asthma in adult. There are no reports of coughing or shortness of breath. She does continue to pick at her colostomy. There are no reports of uncontrolled pain present.    Past Medical History:  Diagnosis Date  . Anxiety   . Arthritis   . Asthma   . Cancer ([place])    breast  . Depression   . Diverticulosis   . GERD (gastroesophageal reflux disease)   . HTN (hypertension)   . Shortness of breath    exertion  . Sleep apnea    STOP BANG 4  . Thyroid dysfunction     Past Surgical History:  Procedure Laterality Date  . ABDOMINAL HYSTERECTOMY    . arthroscopic surgery rt knee  1997  . BREAST SURGERY  2001   LEFT/ CANCER   . broken radius ulna  1990   left  . broken right ankle  1976  . EXPLORATORY LAPAROTOMY  08/24/2020   with partial sigmoidectomy creat of end colostomy and application of negative pressure dressing.     Marland Kitchen JOINT REPLACEMENT  right knee 2005 [place]  . left knee  2004  . rt foot bone spur removed  1992  . THYROID SURGERY  1983  . TOTAL KNEE ARTHROPLASTY  01/22/2012   Procedure: TOTAL KNEE ARTHROPLASTY;  Surgeon: Carole Civil, MD;  Location: [facility];  Service: Orthopedics;  Laterality: Left;  . TUBAL LIGATION      Social History   Socioeconomic History  . Marital status:  Widowed    Spouse name: Not on file  . Number of children: Not on file  . Years of education: Not on file  . Highest education level: Not on file  Occupational History  . Not on file  Tobacco Use  . Smoking status: Former Research scientist (life sciences)  . Smokeless tobacco: Former Network engineer  . Vaping Use: Never used  Substance and Sexual Activity  . Alcohol use: No  . Drug use: No  . Sexual activity: Never  Other Topics Concern  . Not on file  Social History Narrative  . Not on file   Social Determinants of Health   Financial Resource Strain: Not on file  Food Insecurity: Not on file  Transportation Needs: Not on file  Physical Activity: Not on file  Stress: Not on file  Social Connections: Not on file  Intimate Partner Violence: Not on file   Family History  Problem Relation Age of Onset  . Pseudochol deficiency Neg Hx   . Malignant hyperthermia Neg Hx   . Hypotension Neg Hx   . Anesthesia problems Neg Hx       VITAL SIGNS BP 126/76   Pulse 76   Temp 97.6 F (36.4 C)   Resp 20   Ht _0  (  1.473 m)   Wt 185 lb 3.2 oz (84 kg)   SpO2 96%   BMI 38.71 kg/m   Outpatient Encounter Medications as of 09/08/2020  Medication Sig  . acetaminophen (TYLENOL) 500 MG tablet Take 500 mg by mouth every 8 (eight) hours as needed.  . Amino Acids-Protein Hydrolys (FEEDING SUPPLEMENT, PRO-STAT 64,) LIQD Take 30 mLs by mouth in the morning and at bedtime.  Marland Kitchen atenolol (TENORMIN) 50 MG tablet Take 50 mg by mouth every evening.  Marland Kitchen BREO ELLIPTA 200-25 MCG/INH AEPB Inhale 1 puff into the lungs daily.  . cetirizine (ZYRTEC) 10 MG tablet Take 10 mg by mouth at bedtime.   . chlorthalidone (HYGROTON) 25 MG tablet Take 25 mg by mouth daily.  . cholecalciferol (VITAMIN D) 1000 UNITS tablet Take 2,000 Units by mouth every morning.  . Cyanocobalamin 1000 MCG/ML KIT Inject as directed. Once a day on Saturday from 09/04/2020-10/02/2020. Then take one injection once a month starting 11/04/2020  . DULoxetine  (CYMBALTA) 60 MG capsule Take 60 mg by mouth daily.  Marland Kitchen LORazepam (ATIVAN) 0.5 MG tablet Take 0.5 mg by mouth every 8 (eight) hours as needed. For anxiety  . melatonin 3 MG TABS tablet Take 3 mg by mouth at bedtime.  . Multiple Vitamins-Minerals (CENTRUM PO) Take by mouth.  . Multiple Vitamins-Minerals (PRESERVISION AREDS 2) CAPS Take 1 capsule by mouth in the morning and at bedtime.  . NON FORMULARY Diet: __x___ Regular, ______ NAS, _______Consistent Carbohydrate, _______NPO _____Other  . nystatin (NYSTATIN) powder Apply 1 application topically daily as needed. Special Instructions: Apply thin layer of Nystatin powder to skin around stoma when changing colostomy bag PRN irritation.  . pantoprazole (PROTONIX) 40 MG tablet Take 40 mg by mouth 2 (two) times daily.  . potassium chloride SA (KLOR-CON) 20 MEQ tablet Take 40 mEq by mouth every morning.  . pregabalin (LYRICA) 75 MG capsule Take 1 capsule (75 mg total) by mouth daily.   No facility-administered encounter medications on file as of 09/08/2020.     SIGNIFICANT DIAGNOSTIC EXAMS   PREVIOUS   08-21-20; ct of abdomen and pelvis:  1. Distended and fluid filled obstructed large bowel with focal transition point in the sigmoid colon, raising concern for underlying stricture or mass in this region. No evidence of pneumatosis or perforation.  2. Gastric suction tube tip terminates in the duodenum consider slight retraction  08-21-20: ct of abdomen and pelvis 1. Gallbladder/biliary layering hyperdense material in the gallbladder may reflect vicarious excretion of contrast or sludge  2. Mesentery somewhat tortuous structure that matches blood pool adjacent to the proximal SMA favored to reflect a varicosity or vascular anastomosis 3. GI tract: colonic diverticulosis without evidence of diverticulitis  4. Vascular aortobilliac atherosclerosis  5. MSK; reverse s-shaped scoliotic curvature of the spine. L1 compression fracture status post  cement augmentation  Osteopenia and poly articular degenerative changes.   NO NEW LABS.   LABS REVIEWED PREVIOUS  08-19-20 vit B12: 176 08-22-20: wbc 8.2; hgb 10.3; hct 31.4; mcv 101.4 plt 151 glucose 58; bun 10 creat 0.25; k+ 4.3; na++ 132 ca 7.4 mag 1.0 phos 1.2  08-23-20: glucose 100 bun 15; creat 0.50 k+ 4.3 na++ 133; ca 8.8  mag 2.8  08-31-20: wbc 6.0; hgb 10.7; hct 32.1; mcv 102.0 plt 288; glucose 153; bun 6; creat 0.42; k+ 3.4; na++ 134  09-06-20: wbc 6.3; hgb 11.4 hct 36.2 mcv 104.9; plt 340; glucose 98 bun 10; creat 0.61; k+ 3.6; na++ 133; ca 9.2 liver normal  albumin 2.6  NO NEW LABS.    Review of Systems  Unable to perform ROS: Dementia (unable to participate )   Physical Exam Constitutional:      General: She is not in acute distress.    Appearance: She is well-developed and well-nourished. She is obese. She is not diaphoretic.  Neck:     Thyroid: No thyromegaly.  Cardiovascular:     Rate and Rhythm: Normal rate and regular rhythm.     Pulses: Normal pulses and intact distal pulses.     Heart sounds: Normal heart sounds.  Pulmonary:     Effort: Pulmonary effort is normal. No respiratory distress.     Breath sounds: Normal breath sounds.  Abdominal:     General: Bowel sounds are normal. There is no distension.     Palpations: Abdomen is soft.     Tenderness: There is no abdominal tenderness.     Comments:  Colostomy present with stool and gas in bag       Musculoskeletal:        General: No edema.     Cervical back: Neck supple.     Right lower leg: No edema.     Left lower leg: No edema.     Comments:  Trace bilateral lower extremity edema Is able to move all extremities Has scoliosis     Lymphadenopathy:     Cervical: No cervical adenopathy.  Skin:    General: Skin is warm and dry.     Comments: Incision line intact  Neurological:     Mental Status: She is alert. Mental status is at baseline.  Psychiatric:        Mood and Affect: Mood and affect and mood  normal.      ASSESSMENT/ PLAN:  TODAY  1. Large bowel obstruction/status post exploratory lap/status post colostomy: is stable will continue to monitor her status; her bowels are moving.   2. Degenerative disc disease lumbar: is without change will continue lyrica 75 mg nightly cymbalta 60 mg daily   3. Moderate persistent asthma in adult: is stable will continue breo ellipta 200/25 mcg daily   PREVIOUS   4. Non-seasonal allergic rhinitis unspecified trigger: is stable will continue zyrtec 10 mg daily flonase daily   5. GERD without esophagitis: is stable will continue protonix 40 mg twice daily   6. Essential hypertension: is stable 126/76 will continue tenormin 50 mg daily hygroton 25 mg daily   7. Hypokalemia: k+ 3.4 will continue k+ 40 meq daily   8. Major depression recurrent chronic: is stable will continue cymbalta 60 mg daily melatonin 3 mg nightly and has ativan 0.5 mg every 8 hours as needed   9. Aorto-iliac atherosclerosis: will monitor      MD is aware of resident's narcotic use and is in agreement with current plan of care. We will attempt to wean resident as appropriate.  Ok Edwards NP [facility]  Contact [phone] Monday through Friday 8am- 5pm  After hours call [phone]

## 2020-09-15 ENCOUNTER — Non-Acute Institutional Stay (SKILLED_NURSING_FACILITY): Payer: Medicare Other | Admitting: Adult Health

## 2020-09-15 ENCOUNTER — Encounter: Payer: Self-pay | Admitting: Adult Health

## 2020-09-15 DIAGNOSIS — K219 Gastro-esophageal reflux disease without esophagitis: Secondary | ICD-10-CM

## 2020-09-15 DIAGNOSIS — J3089 Other allergic rhinitis: Secondary | ICD-10-CM

## 2020-09-15 DIAGNOSIS — I10 Essential (primary) hypertension: Secondary | ICD-10-CM

## 2020-09-15 NOTE — Progress Notes (Signed)
Location:  [place] Room Number: 144-P Place of Service:  SNF (31)   CODE STATUS: Full Code  No Known Allergies  Chief Complaint  Patient presents with  . Routine visit          Non-seasonal allergic rhinitis unspecified trigger:   GERD without esophagitis: Essential hypertension   Weekly follow up for the first 30 days post hospitalization.     HPI:  She is a 86 year old long term resident of this facility being seen for the management of her chronic illnesses: Non-seasonal allergic rhinitis unspecified trigger:   GERD without esophagitis: Essential hypertension. There are no reports of heart burn. There are no reports of pain present. No reports of agitation or anxiety.   Past Medical History:  Diagnosis Date  . Anxiety   . Arthritis   . Asthma   . Cancer ([place])    breast  . Depression   . Diverticulosis   . GERD (gastroesophageal reflux disease)   . HTN (hypertension)   . Shortness of breath    exertion  . Sleep apnea    STOP BANG 4  . Thyroid dysfunction     Past Surgical History:  Procedure Laterality Date  . ABDOMINAL HYSTERECTOMY    . arthroscopic surgery rt knee  1997  . BREAST SURGERY  2001   LEFT/ CANCER   . broken radius ulna  1990   left  . broken right ankle  1976  . EXPLORATORY LAPAROTOMY  08/24/2020   with partial sigmoidectomy creat of end colostomy and application of negative pressure dressing.     Marland Kitchen JOINT REPLACEMENT  right knee 2005 [place]  . left knee  2004  . rt foot bone spur removed  1992  . THYROID SURGERY  1983  . TOTAL KNEE ARTHROPLASTY  01/22/2012   Procedure: TOTAL KNEE ARTHROPLASTY;  Surgeon: Carole Civil, MD;  Location: [facility];  Service: Orthopedics;  Laterality: Left;  . TUBAL LIGATION      Social History   Socioeconomic History  . Marital status: Widowed    Spouse name: Not on file  . Number of children: Not on file  . Years of education: Not on file  . Highest education level: Not on file   Occupational History  . Not on file  Tobacco Use  . Smoking status: Former Research scientist (life sciences)  . Smokeless tobacco: Former Network engineer  . Vaping Use: Never used  Substance and Sexual Activity  . Alcohol use: No  . Drug use: No  . Sexual activity: Never  Other Topics Concern  . Not on file  Social History Narrative  . Not on file   Social Determinants of Health   Financial Resource Strain: Not on file  Food Insecurity: Not on file  Transportation Needs: Not on file  Physical Activity: Not on file  Stress: Not on file  Social Connections: Not on file  Intimate Partner Violence: Not on file   Family History  Problem Relation Age of Onset  . Pseudochol deficiency Neg Hx   . Malignant hyperthermia Neg Hx   . Hypotension Neg Hx   . Anesthesia problems Neg Hx       VITAL SIGNS BP 107/65   Pulse 70   Temp (!) 97.4 F (36.3 C)   Resp 20   Ht '4\' 9"'  (1.448 m)   Wt 172 lb (78 kg)   SpO2 99%   BMI 37.22 kg/m   Outpatient Encounter Medications as  of 09/15/2020  Medication Sig  . acetaminophen (TYLENOL) 500 MG tablet Take 500 mg by mouth every 8 (eight) hours as needed.  . Amino Acids-Protein Hydrolys (FEEDING SUPPLEMENT, PRO-STAT 64,) LIQD Take 30 mLs by mouth in the morning and at bedtime.  Marland Kitchen atenolol (TENORMIN) 50 MG tablet Take 50 mg by mouth every evening.  Marland Kitchen BREO ELLIPTA 200-25 MCG/INH AEPB Inhale 1 puff into the lungs daily.  . cetirizine (ZYRTEC) 10 MG tablet Take 10 mg by mouth at bedtime.   . chlorthalidone (HYGROTON) 25 MG tablet Take 25 mg by mouth daily.  . cholecalciferol (VITAMIN D) 1000 UNITS tablet Take 2,000 Units by mouth every morning.  . Cyanocobalamin 1000 MCG/ML KIT Inject as directed. Once a day on Saturday from 09/04/2020-10/02/2020. Then take one injection once a month starting 11/04/2020  . doxycycline (VIBRA-TABS) 100 MG tablet Take 100 mg by mouth every 12 (twelve) hours. 9 am and 9 pm  . DULoxetine (CYMBALTA) 60 MG capsule Take 60 mg by mouth daily.   . melatonin 3 MG TABS tablet Take 3 mg by mouth at bedtime.  . Multiple Vitamins-Minerals (CENTRUM PO) Take by mouth.  . Multiple Vitamins-Minerals (PRESERVISION AREDS 2) CAPS Take 1 capsule by mouth in the morning and at bedtime.  . NON FORMULARY Diet: __x___ Regular, ______ NAS, _______Consistent Carbohydrate, _______NPO _____Other  . nystatin (MYCOSTATIN/NYSTOP) powder Apply 1 application topically daily as needed. Special Instructions: Apply thin layer of Nystatin powder to skin around stoma when changing colostomy bag PRN irritation.  . pantoprazole (PROTONIX) 40 MG tablet Take 40 mg by mouth 2 (two) times daily.  . potassium chloride SA (KLOR-CON) 20 MEQ tablet Take 40 mEq by mouth daily. 10 am give with food and full glass of liquid  . pregabalin (LYRICA) 75 MG capsule Take 1 capsule (75 mg total) by mouth daily.  . [DISCONTINUED] LORazepam (ATIVAN) 0.5 MG tablet Take 0.5 mg by mouth every 8 (eight) hours as needed. For anxiety  . [DISCONTINUED] potassium chloride SA (KLOR-CON) 20 MEQ tablet Take 40 mEq by mouth every morning.   No facility-administered encounter medications on file as of 09/15/2020.     SIGNIFICANT DIAGNOSTIC EXAMS   PREVIOUS   08-21-20; ct of abdomen and pelvis:  1. Distended and fluid filled obstructed large bowel with focal transition point in the sigmoid colon, raising concern for underlying stricture or mass in this region. No evidence of pneumatosis or perforation.  2. Gastric suction tube tip terminates in the duodenum consider slight retraction  08-21-20: ct of abdomen and pelvis 1. Gallbladder/biliary layering hyperdense material in the gallbladder may reflect vicarious excretion of contrast or sludge  2. Mesentery somewhat tortuous structure that matches blood pool adjacent to the proximal SMA favored to reflect a varicosity or vascular anastomosis 3. GI tract: colonic diverticulosis without evidence of diverticulitis  4. Vascular aortobilliac  atherosclerosis  5. MSK; reverse s-shaped scoliotic curvature of the spine. L1 compression fracture status post cement augmentation  Osteopenia and poly articular degenerative changes.   NO NEW LABS.   LABS REVIEWED PREVIOUS  08-19-20 vit B12: 176 08-22-20: wbc 8.2; hgb 10.3; hct 31.4; mcv 101.4 plt 151 glucose 58; bun 10 creat 0.25; k+ 4.3; na++ 132 ca 7.4 mag 1.0 phos 1.2  08-23-20: glucose 100 bun 15; creat 0.50 k+ 4.3 na++ 133; ca 8.8  mag 2.8  08-31-20: wbc 6.0; hgb 10.7; hct 32.1; mcv 102.0 plt 288; glucose 153; bun 6; creat 0.42; k+ 3.4; na++ 134  09-06-20: wbc 6.3;  hgb 11.4 hct 36.2 mcv 104.9; plt 340; glucose 98 bun 10; creat 0.61; k+ 3.6; na++ 133; ca 9.2 liver normal albumin 2.6  NO NEW LABS.    Review of Systems  Unable to perform ROS: Dementia (unable to participate )   Physical Exam Constitutional:      General: She is not in acute distress.    Appearance: She is well-developed and well-nourished. She is obese. She is not diaphoretic.  Neck:     Thyroid: No thyromegaly.  Cardiovascular:     Rate and Rhythm: Normal rate and regular rhythm.     Pulses: Intact distal pulses.     Heart sounds: Normal heart sounds.  Pulmonary:     Effort: Pulmonary effort is normal. No respiratory distress.     Breath sounds: Normal breath sounds.  Abdominal:     General: Bowel sounds are normal. There is no distension.     Palpations: Abdomen is soft.     Tenderness: There is no abdominal tenderness.     Comments: Colostomy present with stool and gas in bag        Musculoskeletal:     Cervical back: Neck supple.     Right lower leg: Edema present.     Left lower leg: Edema present.     Comments: Trace bilateral lower extremity edema Is able to move all extremities Has scoliosis      Lymphadenopathy:     Cervical: No cervical adenopathy.  Skin:    General: Skin is warm and dry.  Neurological:     Mental Status: She is alert. Mental status is at baseline.  Psychiatric:         Mood and Affect: Mood and affect and mood normal.     ASSESSMENT/ PLAN:  TODAY  1. Non-seasonal allergic rhinitis unspecified trigger: is stable will continue zyrtec 10 mg daily flonase daily  2. GERD without esophagitis: is stable will continue protonix 40 mg daily   3. Essential hypertension: is stable b/p 107/65 will continue tenormin 50 mg daily hygroton 25 mg daily    PREVIOUS   4. Hypokalemia: k+ 3.4 will continue k+ 40 meq daily   5. Major depression recurrent chronic: is stable will continue cymbalta 60 mg daily melatonin 3 mg nightly is off ativan  6. Aorto-iliac atherosclerosis: will monitor   7. Large bowel obstruction/status post exploratory lap/status post colostomy: is stable will continue to monitor her status; her bowels are moving.   8. Degenerative disc disease lumbar: is without change will continue lyrica 75 mg nightly cymbalta 60 mg daily   9. Moderate persistent asthma in adult: is stable will continue breo ellipta 200/25 mcg daily    MD is aware of resident's narcotic use and is in agreement with current plan of care. We will attempt to wean resident as appropriate.  Ok Edwards NP [facility]  Contact [phone] Monday through Friday 8am- 5pm  After hours call [phone]

## 2020-09-20 ENCOUNTER — Other Ambulatory Visit (HOSPITAL_COMMUNITY)
Admission: RE | Admit: 2020-09-20 | Discharge: 2020-09-20 | Disposition: A | Discharge status: Home / Self Care | Payer: Medicare Other | Source: Ambulatory Visit | Attending: Adult Health | Admitting: Adult Health

## 2020-09-20 DIAGNOSIS — R309 Painful micturition, unspecified: Secondary | ICD-10-CM | POA: Insufficient documentation

## 2020-09-20 DIAGNOSIS — D539 Nutritional anemia, unspecified: Secondary | ICD-10-CM | POA: Diagnosis not present

## 2020-09-20 LAB — OCCULT BLOOD X 1 CARD TO LAB, STOOL: Fecal Occult Bld: POSITIVE — AB

## 2020-09-20 LAB — CBC WITH DIFFERENTIAL/PLATELET
Abs Immature Granulocytes: 0.02 10*3/uL (ref 0.00–0.07)
Basophils Absolute: 0 10*3/uL (ref 0.0–0.1)
Basophils Relative: 0 %
Eosinophils Absolute: 0.2 10*3/uL (ref 0.0–0.5)
Eosinophils Relative: 3 %
HCT: 43.1 % (ref 36.0–46.0)
Hemoglobin: 13.9 g/dL (ref 12.0–15.0)
Immature Granulocytes: 0 %
Lymphocytes Relative: 39 %
Lymphs Abs: 2.7 10*3/uL (ref 0.7–4.0)
MCH: 33.3 pg (ref 26.0–34.0)
MCHC: 32.3 g/dL (ref 30.0–36.0)
MCV: 103.1 fL — ABNORMAL HIGH (ref 80.0–100.0)
Monocytes Absolute: 0.8 10*3/uL (ref 0.1–1.0)
Monocytes Relative: 11 %
Neutro Abs: 3.3 10*3/uL (ref 1.7–7.7)
Neutrophils Relative %: 47 %
Platelets: 332 10*3/uL (ref 150–400)
RBC: 4.18 MIL/uL (ref 3.87–5.11)
RDW: 13.4 % (ref 11.5–15.5)
WBC: 7 10*3/uL (ref 4.0–10.5)
nRBC: 0 % (ref 0.0–0.2)

## 2020-09-21 ENCOUNTER — Encounter: Payer: Self-pay | Admitting: Adult Health

## 2020-09-22 DIAGNOSIS — K56699 Other intestinal obstruction unspecified as to partial versus complete obstruction: Secondary | ICD-10-CM | POA: Diagnosis not present

## 2020-09-22 DIAGNOSIS — Z1159 Encounter for screening for other viral diseases: Secondary | ICD-10-CM | POA: Diagnosis not present

## 2020-09-23 ENCOUNTER — Encounter: Payer: Self-pay | Admitting: Adult Health

## 2020-09-23 ENCOUNTER — Non-Acute Institutional Stay (SKILLED_NURSING_FACILITY): Payer: Medicare Other | Admitting: Adult Health

## 2020-09-23 DIAGNOSIS — I708 Atherosclerosis of other arteries: Secondary | ICD-10-CM

## 2020-09-23 DIAGNOSIS — K56609 Unspecified intestinal obstruction, unspecified as to partial versus complete obstruction: Secondary | ICD-10-CM

## 2020-09-23 DIAGNOSIS — I700 Atherosclerosis of aorta: Secondary | ICD-10-CM | POA: Diagnosis not present

## 2020-09-23 DIAGNOSIS — F339 Major depressive disorder, recurrent, unspecified: Secondary | ICD-10-CM

## 2020-09-23 NOTE — Progress Notes (Signed)
Location:  [facility] Room Number: 144/P Place of Service:  SNF (31)   CODE STATUS: Full Code  No Known Allergies  Chief Complaint  Patient presents with  . Acute Visit    Care Plan Meeting    HPI:  We have come together for her care plan meeting. Family present. BIMS 15/15 mood 0/30. She is extensive assist with adls. Is incontinent of bladder; has colostomy. Is able to feed self. Her weight is stable around 172 pounds is presently on daily weights for 2 weeks; appetite is fair is on supplements. Her family would like for her to begin ensure twice daily. We have discussed her code status; including the chances of survival and what that would like. At this time the family would like to discuss this among themselves before making any decisions. She has had a fall 09-02-20 without injury.she continues to be followed for her chronic illnesses including:  aorto-iliac atherosclerosis Large bowel obstruction Major depression recurrent chronic    Past Medical History:  Diagnosis Date  . Anxiety   . Arthritis   . Asthma   . Cancer ([place])    breast  . Depression   . Diverticulosis   . GERD (gastroesophageal reflux disease)   . HTN (hypertension)   . Shortness of breath    exertion  . Sleep apnea    STOP BANG 4  . Thyroid dysfunction     Past Surgical History:  Procedure Laterality Date  . ABDOMINAL HYSTERECTOMY    . arthroscopic surgery rt knee  1997  . BREAST SURGERY  2001   LEFT/ CANCER   . broken radius ulna  1990   left  . broken right ankle  1976  . EXPLORATORY LAPAROTOMY  08/24/2020   with partial sigmoidectomy creat of end colostomy and application of negative pressure dressing.     Marland Kitchen JOINT REPLACEMENT  right knee 2005 [place]  . left knee  2004  . rt foot bone spur removed  1992  . THYROID SURGERY  1983  . TOTAL KNEE ARTHROPLASTY  01/22/2012   Procedure: TOTAL KNEE ARTHROPLASTY;  Surgeon: Carole Civil, MD;  Location: [facility];  Service:  Orthopedics;  Laterality: Left;  . TUBAL LIGATION      Social History   Socioeconomic History  . Marital status: Widowed    Spouse name: Not on file  . Number of children: Not on file  . Years of education: Not on file  . Highest education level: Not on file  Occupational History  . Not on file  Tobacco Use  . Smoking status: Former Research scientist (life sciences)  . Smokeless tobacco: Former Network engineer  . Vaping Use: Never used  Substance and Sexual Activity  . Alcohol use: No  . Drug use: No  . Sexual activity: Never  Other Topics Concern  . Not on file  Social History Narrative  . Not on file   Social Determinants of Health   Financial Resource Strain: Not on file  Food Insecurity: Not on file  Transportation Needs: Not on file  Physical Activity: Not on file  Stress: Not on file  Social Connections: Not on file  Intimate Partner Violence: Not on file   Family History  Problem Relation Age of Onset  . Pseudochol deficiency Neg Hx   . Malignant hyperthermia Neg Hx   . Hypotension Neg Hx   . Anesthesia problems Neg Hx       VITAL SIGNS BP (!) 93/55  Pulse 71   Temp (!) 97.2 F (36.2 C)   Resp 20   Ht '4\' 9"'  (1.448 m)   Wt 172 lb (78 kg)   SpO2 99%   BMI 37.22 kg/m   Outpatient Encounter Medications as of 09/23/2020  Medication Sig  . acetaminophen (TYLENOL) 500 MG tablet Take 500 mg by mouth every 8 (eight) hours as needed.  . Amino Acids-Protein Hydrolys (FEEDING SUPPLEMENT, PRO-STAT 64,) LIQD Take 30 mLs by mouth in the morning and at bedtime.  Marland Kitchen atenolol (TENORMIN) 50 MG tablet Take 50 mg by mouth every evening.  Marland Kitchen BREO ELLIPTA 200-25 MCG/INH AEPB Inhale 1 puff into the lungs daily.  . chlorthalidone (HYGROTON) 25 MG tablet Take 25 mg by mouth daily.  . cholecalciferol (VITAMIN D) 1000 UNITS tablet Take 2,000 Units by mouth every morning.  . Cyanocobalamin 1000 MCG/ML KIT Inject as directed. Once a day on Saturday from 09/04/2020-10/02/2020. Then take one injection  once a month starting 11/04/2020  . DULoxetine (CYMBALTA) 60 MG capsule Take 60 mg by mouth daily.  Marland Kitchen loratadine (CLARITIN) 10 MG tablet Take 10 mg by mouth daily.  . melatonin 3 MG TABS tablet Take 3 mg by mouth at bedtime.  . Multiple Vitamins-Minerals (CENTRUM PO) Take by mouth.  . Multiple Vitamins-Minerals (PRESERVISION AREDS 2) CAPS Take 1 capsule by mouth in the morning and at bedtime.  . NON FORMULARY Diet: __x___ Regular, ______ NAS, _______Consistent Carbohydrate, _______NPO _____Other  . nystatin (MYCOSTATIN/NYSTOP) powder Apply 1 application topically daily as needed. Special Instructions: Apply thin layer of Nystatin powder to skin around stoma when changing colostomy bag PRN irritation.  . pantoprazole (PROTONIX) 40 MG tablet Take 40 mg by mouth 2 (two) times daily.  . potassium chloride SA (KLOR-CON) 20 MEQ tablet Take 40 mEq by mouth daily. 10 am give with food and full glass of liquid  . pregabalin (LYRICA) 75 MG capsule Take 1 capsule (75 mg total) by mouth daily.  . [DISCONTINUED] cetirizine (ZYRTEC) 10 MG tablet Take 10 mg by mouth at bedtime.    No facility-administered encounter medications on file as of 09/23/2020.     SIGNIFICANT DIAGNOSTIC EXAMS   PREVIOUS   08-21-20; ct of abdomen and pelvis:  1. Distended and fluid filled obstructed large bowel with focal transition point in the sigmoid colon, raising concern for underlying stricture or mass in this region. No evidence of pneumatosis or perforation.  2. Gastric suction tube tip terminates in the duodenum consider slight retraction  08-21-20: ct of abdomen and pelvis 1. Gallbladder/biliary layering hyperdense material in the gallbladder may reflect vicarious excretion of contrast or sludge  2. Mesentery somewhat tortuous structure that matches blood pool adjacent to the proximal SMA favored to reflect a varicosity or vascular anastomosis 3. GI tract: colonic diverticulosis without evidence of diverticulitis   4. Vascular aortobilliac atherosclerosis  5. MSK; reverse s-shaped scoliotic curvature of the spine. L1 compression fracture status post cement augmentation  Osteopenia and poly articular degenerative changes.   NO NEW LABS.   LABS REVIEWED PREVIOUS  08-19-20 vit B12: 176 08-22-20: wbc 8.2; hgb 10.3; hct 31.4; mcv 101.4 plt 151 glucose 58; bun 10 creat 0.25; k+ 4.3; na++ 132 ca 7.4 mag 1.0 phos 1.2  08-23-20: glucose 100 bun 15; creat 0.50 k+ 4.3 na++ 133; ca 8.8  mag 2.8  08-31-20: wbc 6.0; hgb 10.7; hct 32.1; mcv 102.0 plt 288; glucose 153; bun 6; creat 0.42; k+ 3.4; na++ 134  09-06-20: wbc 6.3; hgb 11.4  hct 36.2 mcv 104.9; plt 340; glucose 98 bun 10; creat 0.61; k+ 3.6; na++ 133; ca 9.2 liver normal albumin 2.6  NO NEW LABS.    Review of Systems  Unable to perform ROS: Dementia (unable to participate )     Physical Exam Constitutional:      General: She is not in acute distress.    Appearance: She is well-developed and well-nourished. She is obese. She is not diaphoretic.  Neck:     Thyroid: No thyromegaly.  Cardiovascular:     Rate and Rhythm: Normal rate and regular rhythm.     Pulses: Normal pulses and intact distal pulses.     Heart sounds: Normal heart sounds.  Pulmonary:     Effort: Pulmonary effort is normal. No respiratory distress.     Breath sounds: Normal breath sounds.  Abdominal:     General: Bowel sounds are normal. There is no distension.     Palpations: Abdomen is soft.     Tenderness: There is no abdominal tenderness.     Comments: Colostomy present   Musculoskeletal:        General: No edema. Normal range of motion.     Cervical back: Neck supple.     Right lower leg: No edema.     Left lower leg: No edema.     Comments: Is able to move all extremities Has scoliosis       Lymphadenopathy:     Cervical: No cervical adenopathy.  Skin:    General: Skin is warm and dry.  Neurological:     Mental Status: She is alert. Mental status is at baseline.   Psychiatric:        Mood and Affect: Mood and affect and mood normal.     ASSESSMENT/ PLAN:  TODAY  1. aorto-iliac atherosclerosis 2. Large bowel obstruction 3. Major depression recurrent chronic  Will continue current medications Will continue current plan of care Will continue to monitor her status.  Her family is going to discuss her code status and will call the facility if they wish to make changes.     MD is aware of resident's narcotic use and is in agreement with current plan of care. We will attempt to wean resident as appropriate.  Ok Edwards NP [facility]  Contact [phone] Monday through Friday 8am- 5pm  After hours call [phone]

## 2020-09-24 DIAGNOSIS — E7849 Other hyperlipidemia: Secondary | ICD-10-CM | POA: Diagnosis not present

## 2020-09-24 DIAGNOSIS — F331 Major depressive disorder, recurrent, moderate: Secondary | ICD-10-CM | POA: Diagnosis not present

## 2020-09-24 DIAGNOSIS — M4106 Infantile idiopathic scoliosis, lumbar region: Secondary | ICD-10-CM | POA: Diagnosis not present

## 2020-09-24 DIAGNOSIS — I10 Essential (primary) hypertension: Secondary | ICD-10-CM | POA: Diagnosis not present

## 2020-09-27 DIAGNOSIS — Z1159 Encounter for screening for other viral diseases: Secondary | ICD-10-CM | POA: Diagnosis not present

## 2020-09-27 DIAGNOSIS — K56699 Other intestinal obstruction unspecified as to partial versus complete obstruction: Secondary | ICD-10-CM | POA: Diagnosis not present

## 2020-09-28 DIAGNOSIS — Z9889 Other specified postprocedural states: Secondary | ICD-10-CM | POA: Diagnosis not present

## 2020-09-28 DIAGNOSIS — K5669 Other partial intestinal obstruction: Secondary | ICD-10-CM | POA: Diagnosis not present

## 2020-09-29 ENCOUNTER — Other Ambulatory Visit: Payer: Self-pay | Admitting: Adult Health

## 2020-09-29 MED ORDER — PREGABALIN 75 MG PO CAPS: 75.0000 mg | ORAL_CAPSULE | Freq: Every day | ORAL | 0 refills | Status: DC

## 2020-09-30 ENCOUNTER — Encounter: Payer: Self-pay | Admitting: Adult Health

## 2020-10-04 DIAGNOSIS — Z48815 Encounter for surgical aftercare following surgery on the digestive system: Secondary | ICD-10-CM | POA: Diagnosis not present

## 2020-10-04 DIAGNOSIS — K56699 Other intestinal obstruction unspecified as to partial versus complete obstruction: Secondary | ICD-10-CM | POA: Diagnosis not present

## 2020-10-04 DIAGNOSIS — M6281 Muscle weakness (generalized): Secondary | ICD-10-CM | POA: Diagnosis not present

## 2020-10-04 DIAGNOSIS — Z741 Need for assistance with personal care: Secondary | ICD-10-CM | POA: Diagnosis not present

## 2020-10-04 DIAGNOSIS — R2681 Unsteadiness on feet: Secondary | ICD-10-CM | POA: Diagnosis not present

## 2020-10-06 DIAGNOSIS — Z1159 Encounter for screening for other viral diseases: Secondary | ICD-10-CM | POA: Diagnosis not present

## 2020-10-06 DIAGNOSIS — K56699 Other intestinal obstruction unspecified as to partial versus complete obstruction: Secondary | ICD-10-CM | POA: Diagnosis not present

## 2020-10-08 ENCOUNTER — Encounter: Payer: Self-pay | Admitting: Adult Health

## 2020-10-08 ENCOUNTER — Non-Acute Institutional Stay (SKILLED_NURSING_FACILITY): Payer: Medicare Other | Admitting: Adult Health

## 2020-10-08 DIAGNOSIS — E876 Hypokalemia: Secondary | ICD-10-CM

## 2020-10-08 DIAGNOSIS — I708 Atherosclerosis of other arteries: Secondary | ICD-10-CM | POA: Diagnosis not present

## 2020-10-08 DIAGNOSIS — I700 Atherosclerosis of aorta: Secondary | ICD-10-CM | POA: Diagnosis not present

## 2020-10-08 DIAGNOSIS — F339 Major depressive disorder, recurrent, unspecified: Secondary | ICD-10-CM | POA: Diagnosis not present

## 2020-10-08 DIAGNOSIS — K56699 Other intestinal obstruction unspecified as to partial versus complete obstruction: Secondary | ICD-10-CM | POA: Diagnosis not present

## 2020-10-08 DIAGNOSIS — R2681 Unsteadiness on feet: Secondary | ICD-10-CM | POA: Diagnosis not present

## 2020-10-08 DIAGNOSIS — Z741 Need for assistance with personal care: Secondary | ICD-10-CM | POA: Diagnosis not present

## 2020-10-08 DIAGNOSIS — M6281 Muscle weakness (generalized): Secondary | ICD-10-CM | POA: Diagnosis not present

## 2020-10-08 DIAGNOSIS — Z48815 Encounter for surgical aftercare following surgery on the digestive system: Secondary | ICD-10-CM | POA: Diagnosis not present

## 2020-10-08 NOTE — Progress Notes (Signed)
Location:  [facility] Room Number: 144-P Place of Service:  SNF (31)   CODE STATUS: DNR  No Known Allergies  Chief Complaint  Patient presents with  . Medical Management of Chronic Issues         Hypokalemia:  Major depression recurrent chronic: Aortoiliac atherosclerosis    HPI:  She is a 87 year old long term resident of this facility being seen for the management of her chronic illnesses: Hypokalemia:  Major depression recurrent chronic: Aortoiliac atherosclerosis. There are no reports of uncontrolled pain; her bowels are moving; no reports of anxiety or agitation.    Past Medical History:  Diagnosis Date  . Anxiety   . Arthritis   . Asthma   . Cancer ([place])    breast  . Depression   . Diverticulosis   . GERD (gastroesophageal reflux disease)   . HTN (hypertension)   . Shortness of breath    exertion  . Sleep apnea    STOP BANG 4  . Thyroid dysfunction     Past Surgical History:  Procedure Laterality Date  . ABDOMINAL HYSTERECTOMY    . arthroscopic surgery rt knee  1997  . BREAST SURGERY  2001   LEFT/ CANCER   . broken radius ulna  1990   left  . broken right ankle  1976  . EXPLORATORY LAPAROTOMY  08/24/2020   with partial sigmoidectomy creat of end colostomy and application of negative pressure dressing.     Marland Kitchen JOINT REPLACEMENT  right knee 2005 [place]  . left knee  2004  . rt foot bone spur removed  1992  . THYROID SURGERY  1983  . TOTAL KNEE ARTHROPLASTY  01/22/2012   Procedure: TOTAL KNEE ARTHROPLASTY;  Surgeon: Carole Civil, MD;  Location: [facility];  Service: Orthopedics;  Laterality: Left;  . TUBAL LIGATION      Social History   Socioeconomic History  . Marital status: Widowed    Spouse name: Not on file  . Number of children: Not on file  . Years of education: Not on file  . Highest education level: Not on file  Occupational History  . Not on file  Tobacco Use  . Smoking status: Former Research scientist (life sciences)  . Smokeless  tobacco: Former Network engineer  . Vaping Use: Never used  Substance and Sexual Activity  . Alcohol use: No  . Drug use: No  . Sexual activity: Never  Other Topics Concern  . Not on file  Social History Narrative  . Not on file   Social Determinants of Health   Financial Resource Strain: Not on file  Food Insecurity: Not on file  Transportation Needs: Not on file  Physical Activity: Not on file  Stress: Not on file  Social Connections: Not on file  Intimate Partner Violence: Not on file   Family History  Problem Relation Age of Onset  . Pseudochol deficiency Neg Hx   . Malignant hyperthermia Neg Hx   . Hypotension Neg Hx   . Anesthesia problems Neg Hx       VITAL SIGNS BP 100/60   Pulse 78   Temp 97.9 F (36.6 C)   Ht $R'4\' 9"'uK$  (1.448 m)   Wt 172 lb (78 kg)   BMI 37.22 kg/m   Outpatient Encounter Medications as of 10/08/2020  Medication Sig  . acetaminophen (TYLENOL) 500 MG tablet Take 500 mg by mouth every 8 (eight) hours as needed.  . Amino Acids-Protein Hydrolys (FEEDING SUPPLEMENT, PRO-STAT  64,) LIQD Take 30 mLs by mouth in the morning and at bedtime.  Marland Kitchen atenolol (TENORMIN) 50 MG tablet Take 50 mg by mouth every evening.  Marland Kitchen BREO ELLIPTA 200-25 MCG/INH AEPB Inhale 1 puff into the lungs daily.  . chlorthalidone (HYGROTON) 25 MG tablet Take 25 mg by mouth daily.  . cholecalciferol (VITAMIN D) 1000 UNITS tablet Take 2,000 Units by mouth every morning.  . Cyanocobalamin 1000 MCG/ML KIT Inject as directed. Once a day on Saturday from 09/04/2020-10/02/2020. Then take one injection once a month starting 11/04/2020  . DULoxetine (CYMBALTA) 60 MG capsule Take 60 mg by mouth daily.  Marland Kitchen loratadine (CLARITIN) 10 MG tablet Take 10 mg by mouth daily.  . melatonin 3 MG TABS tablet Take 3 mg by mouth at bedtime.  . Multiple Vitamins-Minerals (CENTRUM PO) Take by mouth.  . Multiple Vitamins-Minerals (PRESERVISION AREDS 2) CAPS Take 1 capsule by mouth in the morning and at bedtime.   . NON FORMULARY Diet: __x___ Regular, ______ NAS, _______Consistent Carbohydrate, _______NPO _____Other  . nystatin (MYCOSTATIN/NYSTOP) powder Apply 1 application topically daily as needed. Special Instructions: Apply thin layer of Nystatin powder to skin around stoma when changing colostomy bag PRN irritation.  . pantoprazole (PROTONIX) 40 MG tablet Take 40 mg by mouth 2 (two) times daily.  . potassium chloride SA (KLOR-CON) 20 MEQ tablet Take 40 mEq by mouth daily. 10 am give with food and full glass of liquid  . pregabalin (LYRICA) 75 MG capsule Take 1 capsule (75 mg total) by mouth daily.   No facility-administered encounter medications on file as of 10/08/2020.     SIGNIFICANT DIAGNOSTIC EXAMS  PREVIOUS   08-21-20; ct of abdomen and pelvis:  1. Distended and fluid filled obstructed large bowel with focal transition point in the sigmoid colon, raising concern for underlying stricture or mass in this region. No evidence of pneumatosis or perforation.  2. Gastric suction tube tip terminates in the duodenum consider slight retraction  08-21-20: ct of abdomen and pelvis 1. Gallbladder/biliary layering hyperdense material in the gallbladder may reflect vicarious excretion of contrast or sludge  2. Mesentery somewhat tortuous structure that matches blood pool adjacent to the proximal SMA favored to reflect a varicosity or vascular anastomosis 3. GI tract: colonic diverticulosis without evidence of diverticulitis  4. Vascular aortobilliac atherosclerosis  5. MSK; reverse s-shaped scoliotic curvature of the spine. L1 compression fracture status post cement augmentation  Osteopenia and poly articular degenerative changes.   NO NEW LABS.   LABS REVIEWED PREVIOUS  08-19-20 vit B12: 176 08-22-20: wbc 8.2; hgb 10.3; hct 31.4; mcv 101.4 plt 151 glucose 58; bun 10 creat 0.25; k+ 4.3; na++ 132 ca 7.4 mag 1.0 phos 1.2  08-23-20: glucose 100 bun 15; creat 0.50 k+ 4.3 na++ 133; ca 8.8  mag 2.8   08-31-20: wbc 6.0; hgb 10.7; hct 32.1; mcv 102.0 plt 288; glucose 153; bun 6; creat 0.42; k+ 3.4; na++ 134  09-06-20: wbc 6.3; hgb 11.4 hct 36.2 mcv 104.9; plt 340; glucose 98 bun 10; creat 0.61; k+ 3.6; na++ 133; ca 9.2 liver normal albumin 2.6  NO NEW LABS.    Review of Systems  Unable to perform ROS: Dementia (unable to participate )   Physical Exam Constitutional:      General: She is not in acute distress.    Appearance: She is well-developed and well-nourished. She is obese. She is not diaphoretic.  Neck:     Thyroid: No thyromegaly.  Cardiovascular:     Rate  and Rhythm: Normal rate and regular rhythm.     Pulses: Normal pulses and intact distal pulses.     Heart sounds: Normal heart sounds.  Pulmonary:     Effort: Pulmonary effort is normal. No respiratory distress.     Breath sounds: Normal breath sounds.  Abdominal:     General: Bowel sounds are normal. There is no distension.     Palpations: Abdomen is soft.     Tenderness: There is no abdominal tenderness.     Comments: Colostomy present    Musculoskeletal:        General: No edema. Normal range of motion.     Cervical back: Neck supple.     Right lower leg: No edema.     Left lower leg: No edema.     Comments:  Is able to move all extremities Has scoliosis    Lymphadenopathy:     Cervical: No cervical adenopathy.  Skin:    General: Skin is warm and dry.  Neurological:     Mental Status: She is alert. Mental status is at baseline.  Psychiatric:        Mood and Affect: Mood and affect and mood normal.     ASSESSMENT/ PLAN:  TODAY  1. Hypokalemia: is stable k+ 3.6 will continue k+ 40 meq daily  2. Major depression recurrent chronic: is stable will continue cymbalta 60 mg daily melatonin 3 mg nightly for sleep is off ativan   3. Aortoiliac atherosclerosis (CT 08-21-20); will monitor    PREVIOUS   4. Large bowel obstruction/status post exploratory lap/status post colostomy: is stable will continue to  monitor her status; her bowels are moving.   5. Degenerative disc disease lumbar: is without change will continue lyrica 75 mg nightly cymbalta 60 mg daily   6. Moderate persistent asthma in adult: is stable will continue breo ellipta 200/25 mcg daily  7. Non-seasonal allergic rhinitis unspecified trigger: is stable will continue zyrtec 10 mg daily flonase daily  8. GERD without esophagitis: is stable will continue protonix 40 mg daily   9. Essential hypertension: is stable b/p 100/60 will continue tenormin 50 mg daily hygroton 25 mg daily    MD is aware of resident's narcotic use and is in agreement with current plan of care. We will attempt to wean resident as appropriate.  Ok Edwards NP [facility]  Contact [phone] Monday through Friday 8am- 5pm  After hours call [phone]

## 2020-10-11 DIAGNOSIS — K56699 Other intestinal obstruction unspecified as to partial versus complete obstruction: Secondary | ICD-10-CM | POA: Diagnosis not present

## 2020-10-11 DIAGNOSIS — Z741 Need for assistance with personal care: Secondary | ICD-10-CM | POA: Diagnosis not present

## 2020-10-11 DIAGNOSIS — M6281 Muscle weakness (generalized): Secondary | ICD-10-CM | POA: Diagnosis not present

## 2020-10-11 DIAGNOSIS — Z48815 Encounter for surgical aftercare following surgery on the digestive system: Secondary | ICD-10-CM | POA: Diagnosis not present

## 2020-10-11 DIAGNOSIS — R2681 Unsteadiness on feet: Secondary | ICD-10-CM | POA: Diagnosis not present

## 2020-10-12 DIAGNOSIS — M6281 Muscle weakness (generalized): Secondary | ICD-10-CM | POA: Diagnosis not present

## 2020-10-12 DIAGNOSIS — K56699 Other intestinal obstruction unspecified as to partial versus complete obstruction: Secondary | ICD-10-CM | POA: Diagnosis not present

## 2020-10-12 DIAGNOSIS — Z48815 Encounter for surgical aftercare following surgery on the digestive system: Secondary | ICD-10-CM | POA: Diagnosis not present

## 2020-10-12 DIAGNOSIS — Z741 Need for assistance with personal care: Secondary | ICD-10-CM | POA: Diagnosis not present

## 2020-10-12 DIAGNOSIS — R2681 Unsteadiness on feet: Secondary | ICD-10-CM | POA: Diagnosis not present

## 2020-10-13 DIAGNOSIS — Z1159 Encounter for screening for other viral diseases: Secondary | ICD-10-CM | POA: Diagnosis not present

## 2020-10-13 DIAGNOSIS — M6281 Muscle weakness (generalized): Secondary | ICD-10-CM | POA: Diagnosis not present

## 2020-10-13 DIAGNOSIS — Z48815 Encounter for surgical aftercare following surgery on the digestive system: Secondary | ICD-10-CM | POA: Diagnosis not present

## 2020-10-13 DIAGNOSIS — K56699 Other intestinal obstruction unspecified as to partial versus complete obstruction: Secondary | ICD-10-CM | POA: Diagnosis not present

## 2020-10-13 DIAGNOSIS — Z741 Need for assistance with personal care: Secondary | ICD-10-CM | POA: Diagnosis not present

## 2020-10-13 DIAGNOSIS — R2681 Unsteadiness on feet: Secondary | ICD-10-CM | POA: Diagnosis not present

## 2020-10-14 DIAGNOSIS — Z48815 Encounter for surgical aftercare following surgery on the digestive system: Secondary | ICD-10-CM | POA: Diagnosis not present

## 2020-10-14 DIAGNOSIS — K56699 Other intestinal obstruction unspecified as to partial versus complete obstruction: Secondary | ICD-10-CM | POA: Diagnosis not present

## 2020-10-14 DIAGNOSIS — M6281 Muscle weakness (generalized): Secondary | ICD-10-CM | POA: Diagnosis not present

## 2020-10-14 DIAGNOSIS — R2681 Unsteadiness on feet: Secondary | ICD-10-CM | POA: Diagnosis not present

## 2020-10-14 DIAGNOSIS — Z741 Need for assistance with personal care: Secondary | ICD-10-CM | POA: Diagnosis not present

## 2020-10-15 ENCOUNTER — Encounter: Payer: Self-pay | Admitting: Adult Health

## 2020-10-15 DIAGNOSIS — Z48815 Encounter for surgical aftercare following surgery on the digestive system: Secondary | ICD-10-CM | POA: Diagnosis not present

## 2020-10-15 DIAGNOSIS — K56699 Other intestinal obstruction unspecified as to partial versus complete obstruction: Secondary | ICD-10-CM | POA: Diagnosis not present

## 2020-10-15 DIAGNOSIS — R2681 Unsteadiness on feet: Secondary | ICD-10-CM | POA: Diagnosis not present

## 2020-10-15 DIAGNOSIS — M6281 Muscle weakness (generalized): Secondary | ICD-10-CM | POA: Diagnosis not present

## 2020-10-15 DIAGNOSIS — Z1159 Encounter for screening for other viral diseases: Secondary | ICD-10-CM | POA: Diagnosis not present

## 2020-10-15 DIAGNOSIS — Z741 Need for assistance with personal care: Secondary | ICD-10-CM | POA: Diagnosis not present

## 2020-10-17 DIAGNOSIS — Z741 Need for assistance with personal care: Secondary | ICD-10-CM | POA: Diagnosis not present

## 2020-10-17 DIAGNOSIS — Z48815 Encounter for surgical aftercare following surgery on the digestive system: Secondary | ICD-10-CM | POA: Diagnosis not present

## 2020-10-17 DIAGNOSIS — R2681 Unsteadiness on feet: Secondary | ICD-10-CM | POA: Diagnosis not present

## 2020-10-17 DIAGNOSIS — M6281 Muscle weakness (generalized): Secondary | ICD-10-CM | POA: Diagnosis not present

## 2020-10-17 DIAGNOSIS — K56699 Other intestinal obstruction unspecified as to partial versus complete obstruction: Secondary | ICD-10-CM | POA: Diagnosis not present

## 2020-10-18 DIAGNOSIS — Z1159 Encounter for screening for other viral diseases: Secondary | ICD-10-CM | POA: Diagnosis not present

## 2020-10-18 DIAGNOSIS — K56699 Other intestinal obstruction unspecified as to partial versus complete obstruction: Secondary | ICD-10-CM | POA: Diagnosis not present

## 2020-10-19 DIAGNOSIS — K56699 Other intestinal obstruction unspecified as to partial versus complete obstruction: Secondary | ICD-10-CM | POA: Diagnosis not present

## 2020-10-19 DIAGNOSIS — M6281 Muscle weakness (generalized): Secondary | ICD-10-CM | POA: Diagnosis not present

## 2020-10-19 DIAGNOSIS — Z48815 Encounter for surgical aftercare following surgery on the digestive system: Secondary | ICD-10-CM | POA: Diagnosis not present

## 2020-10-19 DIAGNOSIS — Z741 Need for assistance with personal care: Secondary | ICD-10-CM | POA: Diagnosis not present

## 2020-10-19 DIAGNOSIS — R2681 Unsteadiness on feet: Secondary | ICD-10-CM | POA: Diagnosis not present

## 2020-10-20 DIAGNOSIS — K56699 Other intestinal obstruction unspecified as to partial versus complete obstruction: Secondary | ICD-10-CM | POA: Diagnosis not present

## 2020-10-20 DIAGNOSIS — Z741 Need for assistance with personal care: Secondary | ICD-10-CM | POA: Diagnosis not present

## 2020-10-20 DIAGNOSIS — M6281 Muscle weakness (generalized): Secondary | ICD-10-CM | POA: Diagnosis not present

## 2020-10-20 DIAGNOSIS — Z1159 Encounter for screening for other viral diseases: Secondary | ICD-10-CM | POA: Diagnosis not present

## 2020-10-20 DIAGNOSIS — R2681 Unsteadiness on feet: Secondary | ICD-10-CM | POA: Diagnosis not present

## 2020-10-20 DIAGNOSIS — Z48815 Encounter for surgical aftercare following surgery on the digestive system: Secondary | ICD-10-CM | POA: Diagnosis not present

## 2020-10-21 DIAGNOSIS — Z741 Need for assistance with personal care: Secondary | ICD-10-CM | POA: Diagnosis not present

## 2020-10-21 DIAGNOSIS — Z48815 Encounter for surgical aftercare following surgery on the digestive system: Secondary | ICD-10-CM | POA: Diagnosis not present

## 2020-10-21 DIAGNOSIS — K56699 Other intestinal obstruction unspecified as to partial versus complete obstruction: Secondary | ICD-10-CM | POA: Diagnosis not present

## 2020-10-21 DIAGNOSIS — M6281 Muscle weakness (generalized): Secondary | ICD-10-CM | POA: Diagnosis not present

## 2020-10-21 DIAGNOSIS — R2681 Unsteadiness on feet: Secondary | ICD-10-CM | POA: Diagnosis not present

## 2020-10-22 DIAGNOSIS — Z741 Need for assistance with personal care: Secondary | ICD-10-CM | POA: Diagnosis not present

## 2020-10-22 DIAGNOSIS — K56699 Other intestinal obstruction unspecified as to partial versus complete obstruction: Secondary | ICD-10-CM | POA: Diagnosis not present

## 2020-10-22 DIAGNOSIS — R2681 Unsteadiness on feet: Secondary | ICD-10-CM | POA: Diagnosis not present

## 2020-10-22 DIAGNOSIS — Z48815 Encounter for surgical aftercare following surgery on the digestive system: Secondary | ICD-10-CM | POA: Diagnosis not present

## 2020-10-22 DIAGNOSIS — M6281 Muscle weakness (generalized): Secondary | ICD-10-CM | POA: Diagnosis not present

## 2020-10-22 DIAGNOSIS — Z1159 Encounter for screening for other viral diseases: Secondary | ICD-10-CM | POA: Diagnosis not present

## 2020-10-24 DIAGNOSIS — R2681 Unsteadiness on feet: Secondary | ICD-10-CM | POA: Diagnosis not present

## 2020-10-24 DIAGNOSIS — Z741 Need for assistance with personal care: Secondary | ICD-10-CM | POA: Diagnosis not present

## 2020-10-24 DIAGNOSIS — M6281 Muscle weakness (generalized): Secondary | ICD-10-CM | POA: Diagnosis not present

## 2020-10-24 DIAGNOSIS — K56699 Other intestinal obstruction unspecified as to partial versus complete obstruction: Secondary | ICD-10-CM | POA: Diagnosis not present

## 2020-10-24 DIAGNOSIS — Z48815 Encounter for surgical aftercare following surgery on the digestive system: Secondary | ICD-10-CM | POA: Diagnosis not present

## 2020-10-25 DIAGNOSIS — Z48815 Encounter for surgical aftercare following surgery on the digestive system: Secondary | ICD-10-CM | POA: Diagnosis not present

## 2020-10-25 DIAGNOSIS — R2681 Unsteadiness on feet: Secondary | ICD-10-CM | POA: Diagnosis not present

## 2020-10-25 DIAGNOSIS — Z741 Need for assistance with personal care: Secondary | ICD-10-CM | POA: Diagnosis not present

## 2020-10-25 DIAGNOSIS — Z1159 Encounter for screening for other viral diseases: Secondary | ICD-10-CM | POA: Diagnosis not present

## 2020-10-25 DIAGNOSIS — K56699 Other intestinal obstruction unspecified as to partial versus complete obstruction: Secondary | ICD-10-CM | POA: Diagnosis not present

## 2020-10-25 DIAGNOSIS — M6281 Muscle weakness (generalized): Secondary | ICD-10-CM | POA: Diagnosis not present

## 2020-10-26 DIAGNOSIS — R2681 Unsteadiness on feet: Secondary | ICD-10-CM | POA: Diagnosis not present

## 2020-10-26 DIAGNOSIS — M6281 Muscle weakness (generalized): Secondary | ICD-10-CM | POA: Diagnosis not present

## 2020-10-26 DIAGNOSIS — Z48815 Encounter for surgical aftercare following surgery on the digestive system: Secondary | ICD-10-CM | POA: Diagnosis not present

## 2020-10-26 DIAGNOSIS — Z741 Need for assistance with personal care: Secondary | ICD-10-CM | POA: Diagnosis not present

## 2020-10-26 DIAGNOSIS — K56699 Other intestinal obstruction unspecified as to partial versus complete obstruction: Secondary | ICD-10-CM | POA: Diagnosis not present

## 2020-10-27 ENCOUNTER — Other Ambulatory Visit: Payer: Self-pay | Admitting: Adult Health

## 2020-10-27 DIAGNOSIS — M6281 Muscle weakness (generalized): Secondary | ICD-10-CM | POA: Diagnosis not present

## 2020-10-27 DIAGNOSIS — Z48815 Encounter for surgical aftercare following surgery on the digestive system: Secondary | ICD-10-CM | POA: Diagnosis not present

## 2020-10-27 DIAGNOSIS — K56699 Other intestinal obstruction unspecified as to partial versus complete obstruction: Secondary | ICD-10-CM | POA: Diagnosis not present

## 2020-10-27 DIAGNOSIS — R2681 Unsteadiness on feet: Secondary | ICD-10-CM | POA: Diagnosis not present

## 2020-10-27 DIAGNOSIS — Z741 Need for assistance with personal care: Secondary | ICD-10-CM | POA: Diagnosis not present

## 2020-10-27 MED ORDER — PREGABALIN 75 MG PO CAPS: 75.0000 mg | ORAL_CAPSULE | Freq: Every day | ORAL | 0 refills | Status: DC

## 2020-10-29 DIAGNOSIS — Z1159 Encounter for screening for other viral diseases: Secondary | ICD-10-CM | POA: Diagnosis not present

## 2020-10-29 DIAGNOSIS — R2681 Unsteadiness on feet: Secondary | ICD-10-CM | POA: Diagnosis not present

## 2020-10-29 DIAGNOSIS — Z48815 Encounter for surgical aftercare following surgery on the digestive system: Secondary | ICD-10-CM | POA: Diagnosis not present

## 2020-10-29 DIAGNOSIS — M6281 Muscle weakness (generalized): Secondary | ICD-10-CM | POA: Diagnosis not present

## 2020-10-29 DIAGNOSIS — Z741 Need for assistance with personal care: Secondary | ICD-10-CM | POA: Diagnosis not present

## 2020-10-29 DIAGNOSIS — K56699 Other intestinal obstruction unspecified as to partial versus complete obstruction: Secondary | ICD-10-CM | POA: Diagnosis not present

## 2020-11-01 ENCOUNTER — Non-Acute Institutional Stay (SKILLED_NURSING_FACILITY): Payer: Medicare Other | Admitting: Internal Medicine

## 2020-11-01 ENCOUNTER — Encounter: Payer: Self-pay | Admitting: Internal Medicine

## 2020-11-01 DIAGNOSIS — Z741 Need for assistance with personal care: Secondary | ICD-10-CM | POA: Diagnosis not present

## 2020-11-01 DIAGNOSIS — M6281 Muscle weakness (generalized): Secondary | ICD-10-CM | POA: Diagnosis not present

## 2020-11-01 DIAGNOSIS — K56699 Other intestinal obstruction unspecified as to partial versus complete obstruction: Secondary | ICD-10-CM | POA: Diagnosis not present

## 2020-11-01 DIAGNOSIS — Z933 Colostomy status: Secondary | ICD-10-CM | POA: Diagnosis not present

## 2020-11-01 DIAGNOSIS — Z48815 Encounter for surgical aftercare following surgery on the digestive system: Secondary | ICD-10-CM | POA: Diagnosis not present

## 2020-11-01 DIAGNOSIS — I10 Essential (primary) hypertension: Secondary | ICD-10-CM

## 2020-11-01 DIAGNOSIS — R2681 Unsteadiness on feet: Secondary | ICD-10-CM | POA: Diagnosis not present

## 2020-11-01 DIAGNOSIS — Z1159 Encounter for screening for other viral diseases: Secondary | ICD-10-CM | POA: Diagnosis not present

## 2020-11-01 DIAGNOSIS — D539 Nutritional anemia, unspecified: Secondary | ICD-10-CM | POA: Diagnosis not present

## 2020-11-01 DIAGNOSIS — E43 Unspecified severe protein-calorie malnutrition: Secondary | ICD-10-CM | POA: Diagnosis not present

## 2020-11-01 NOTE — Patient Instructions (Signed)
See assessment and plan under each diagnosis in the problem list and acutely for this visit 

## 2020-11-01 NOTE — Assessment & Plan Note (Signed)
Anemia has resolved but macrocytosis persists.  Follow-up B12 level to assess response to present supplementation.

## 2020-11-01 NOTE — Assessment & Plan Note (Signed)
BP controlled; no change in antihypertensive medications  

## 2020-11-01 NOTE — Assessment & Plan Note (Signed)
Current albumin 2.6 and total protein 5.4.  Nutritional supplements at SNF.

## 2020-11-01 NOTE — Assessment & Plan Note (Addendum)
Wound Care Nurse monitors colostomy; no issues reported.

## 2020-11-01 NOTE — Progress Notes (Signed)
NURSING HOME LOCATION:  [facility] ROOM NUMBER: [id number]  CODE STATUS: Full code  PCP: Gar Ponto, MD  This is a nursing facility follow up visit of chronic medical diagnoses & to document compliance with Regulation 483.30 (c) in [employer] [place] Phase 2 which mandates caregiver visit ( visits can alternate among physician, PA or NP as per statutes) within 10 days of 30 days / 60 days/ 90 days post admission to SNF date    Interim medical record and care since last [place] visit was updated with review of diagnostic studies and change in clinical status since last visit were documented.  HPI: She is a permanent resident of the facility with diagnoses of sleep apnea, essential hypertension, GERD, diverticulosis, history of breast cancer, and history of asthma. Significant surgeries and procedures include abdominal hysterectomy, thyroid surgery, and breast surgery for malignancy in 2001.  Review of systems: Initially she could not or would not give me the month or year but then when I asked again she correctly identified "February, 2022".  She could not name the president.  When I entered the room she was sitting in the chair reading a book her daughter had given her. She states that she has no symptoms except for "little bit of cough" which is apparently nonproductive.  She denies any abdominal symptoms.  Despite the previously low B12; she denies any neurologic symptoms.  Constitutional: No fever, significant weight change, fatigue  Eyes: No redness, discharge, pain, vision change ENT/mouth: No nasal congestion,  purulent discharge, earache, change in hearing, sore throat  Cardiovascular: No chest pain, palpitations, paroxysmal nocturnal dyspnea, claudication, edema  Respiratory: No sputum production, hemoptysis, DOE, significant snoring, apnea   Gastrointestinal: No heartburn, dysphagia, abdominal pain, nausea /vomiting, rectal bleeding,  melena, change in bowels Genitourinary: No dysuria, hematuria, pyuria, incontinence, nocturia Musculoskeletal: No joint stiffness, joint swelling, weakness, pain Dermatologic: No rash, pruritus, change in appearance of skin Neurologic: No dizziness, headache, syncope, seizures, numbness, tingling Psychiatric: No significant anxiety, depression, insomnia, anorexia Endocrine: No change in hair/skin/nails, excessive thirst, excessive hunger, excessive urination  Hematologic/lymphatic: No significant bruising, lymphadenopathy, abnormal bleeding Allergy/immunology: No itchy/watery eyes, significant sneezing, urticaria, angioedema  Physical exam:  Pertinent or positive findings: Hair is disheveled.  She is markedly hard of hearing.  Bilateral ptosis is present.  She has complete dentures.  Heart sounds are distant.  Chest was surprisingly clear.  Abdomen is protuberant with a large panniculus.  Colostomy is present.  She has 1/2+ edema at the sock line.  Pedal pulses are decreased.  She has osteoma over the dorsum of the feet.  She has isolated arthritic changes of the hands with deviation of the fingers.  The knees are markedly enlarged fusiformly.  General appearance: Adequately nourished; no acute distress, increased work of breathing is present.   Lymphatic: No lymphadenopathy about the head, neck, axilla. Eyes: No conjunctival inflammation or lid edema is present. There is no scleral icterus. Ears:  External ear exam shows no significant lesions or deformities.   Nose:  External nasal examination shows no deformity or inflammation. Nasal mucosa are pink and moist without lesions, exudates Oral exam:  Lips and gums are healthy appearing. There is no oropharyngeal erythema or exudate. Neck:  No thyromegaly, masses, tenderness noted.    Heart:  No gallop, murmur, click, rub .  Lungs:  without wheezes, rhonchi, rales, rubs. Abdomen: Bowel sounds are normal. Abdomen is soft and nontender with no  organomegaly, hernias, masses. GU: Deferred  Extremities:  No cyanosis, clubbing, edema  Neurologic exam :Balance, Rhomberg, finger to nose testing could not be completed due to clinical state Skin: Warm & dry w/o tenting. No significant lesions or rash.  See summary under each active problem in the Problem List with associated updated therapeutic plan

## 2020-11-02 DIAGNOSIS — M6281 Muscle weakness (generalized): Secondary | ICD-10-CM | POA: Diagnosis not present

## 2020-11-02 DIAGNOSIS — K56699 Other intestinal obstruction unspecified as to partial versus complete obstruction: Secondary | ICD-10-CM | POA: Diagnosis not present

## 2020-11-02 DIAGNOSIS — Z48815 Encounter for surgical aftercare following surgery on the digestive system: Secondary | ICD-10-CM | POA: Diagnosis not present

## 2020-11-02 DIAGNOSIS — Z741 Need for assistance with personal care: Secondary | ICD-10-CM | POA: Diagnosis not present

## 2020-11-02 DIAGNOSIS — R2681 Unsteadiness on feet: Secondary | ICD-10-CM | POA: Diagnosis not present

## 2020-11-03 ENCOUNTER — Encounter: Payer: Self-pay | Admitting: Internal Medicine

## 2020-11-03 DIAGNOSIS — Z741 Need for assistance with personal care: Secondary | ICD-10-CM | POA: Diagnosis not present

## 2020-11-03 DIAGNOSIS — R2681 Unsteadiness on feet: Secondary | ICD-10-CM | POA: Diagnosis not present

## 2020-11-03 DIAGNOSIS — K56699 Other intestinal obstruction unspecified as to partial versus complete obstruction: Secondary | ICD-10-CM | POA: Diagnosis not present

## 2020-11-03 DIAGNOSIS — Z48815 Encounter for surgical aftercare following surgery on the digestive system: Secondary | ICD-10-CM | POA: Diagnosis not present

## 2020-11-03 DIAGNOSIS — Z1159 Encounter for screening for other viral diseases: Secondary | ICD-10-CM | POA: Diagnosis not present

## 2020-11-03 DIAGNOSIS — M6281 Muscle weakness (generalized): Secondary | ICD-10-CM | POA: Diagnosis not present

## 2020-11-04 DIAGNOSIS — K56699 Other intestinal obstruction unspecified as to partial versus complete obstruction: Secondary | ICD-10-CM | POA: Diagnosis not present

## 2020-11-04 DIAGNOSIS — M6281 Muscle weakness (generalized): Secondary | ICD-10-CM | POA: Diagnosis not present

## 2020-11-04 DIAGNOSIS — R2681 Unsteadiness on feet: Secondary | ICD-10-CM | POA: Diagnosis not present

## 2020-11-04 DIAGNOSIS — Z741 Need for assistance with personal care: Secondary | ICD-10-CM | POA: Diagnosis not present

## 2020-11-04 DIAGNOSIS — Z48815 Encounter for surgical aftercare following surgery on the digestive system: Secondary | ICD-10-CM | POA: Diagnosis not present

## 2020-11-05 DIAGNOSIS — Z1159 Encounter for screening for other viral diseases: Secondary | ICD-10-CM | POA: Diagnosis not present

## 2020-11-05 DIAGNOSIS — K56699 Other intestinal obstruction unspecified as to partial versus complete obstruction: Secondary | ICD-10-CM | POA: Diagnosis not present

## 2020-11-05 DIAGNOSIS — Z48815 Encounter for surgical aftercare following surgery on the digestive system: Secondary | ICD-10-CM | POA: Diagnosis not present

## 2020-11-05 DIAGNOSIS — M6281 Muscle weakness (generalized): Secondary | ICD-10-CM | POA: Diagnosis not present

## 2020-11-05 DIAGNOSIS — Z741 Need for assistance with personal care: Secondary | ICD-10-CM | POA: Diagnosis not present

## 2020-11-05 DIAGNOSIS — R2681 Unsteadiness on feet: Secondary | ICD-10-CM | POA: Diagnosis not present

## 2020-11-07 DIAGNOSIS — R2681 Unsteadiness on feet: Secondary | ICD-10-CM | POA: Diagnosis not present

## 2020-11-07 DIAGNOSIS — K56699 Other intestinal obstruction unspecified as to partial versus complete obstruction: Secondary | ICD-10-CM | POA: Diagnosis not present

## 2020-11-07 DIAGNOSIS — Z48815 Encounter for surgical aftercare following surgery on the digestive system: Secondary | ICD-10-CM | POA: Diagnosis not present

## 2020-11-07 DIAGNOSIS — Z741 Need for assistance with personal care: Secondary | ICD-10-CM | POA: Diagnosis not present

## 2020-11-07 DIAGNOSIS — M6281 Muscle weakness (generalized): Secondary | ICD-10-CM | POA: Diagnosis not present

## 2020-11-08 ENCOUNTER — Other Ambulatory Visit: Payer: Self-pay | Admitting: Adult Health

## 2020-11-08 DIAGNOSIS — M21612 Bunion of left foot: Secondary | ICD-10-CM | POA: Diagnosis not present

## 2020-11-08 DIAGNOSIS — K56699 Other intestinal obstruction unspecified as to partial versus complete obstruction: Secondary | ICD-10-CM | POA: Diagnosis not present

## 2020-11-08 DIAGNOSIS — I739 Peripheral vascular disease, unspecified: Secondary | ICD-10-CM | POA: Diagnosis not present

## 2020-11-08 DIAGNOSIS — I708 Atherosclerosis of other arteries: Secondary | ICD-10-CM

## 2020-11-08 DIAGNOSIS — L602 Onychogryphosis: Secondary | ICD-10-CM | POA: Diagnosis not present

## 2020-11-08 DIAGNOSIS — Z1159 Encounter for screening for other viral diseases: Secondary | ICD-10-CM | POA: Diagnosis not present

## 2020-11-08 DIAGNOSIS — I700 Atherosclerosis of aorta: Secondary | ICD-10-CM

## 2020-11-10 DIAGNOSIS — Z1159 Encounter for screening for other viral diseases: Secondary | ICD-10-CM | POA: Diagnosis not present

## 2020-11-10 DIAGNOSIS — K56699 Other intestinal obstruction unspecified as to partial versus complete obstruction: Secondary | ICD-10-CM | POA: Diagnosis not present

## 2020-12-01 ENCOUNTER — Non-Acute Institutional Stay (SKILLED_NURSING_FACILITY): Payer: Medicare Other | Admitting: Adult Health

## 2020-12-01 ENCOUNTER — Encounter: Payer: Self-pay | Admitting: Adult Health

## 2020-12-01 DIAGNOSIS — B029 Zoster without complications: Secondary | ICD-10-CM

## 2020-12-01 NOTE — Progress Notes (Signed)
Location:  [facility] Room Number: 144/P Place of Service:  SNF (31)   CODE STATUS: DNR  No Known Allergies  Chief Complaint  Patient presents with  . Acute Visit    Rash    HPI:  She was found today to have a pustular rash on her upper back and flank areas which follow a dermatone. She does have some itching present. There are no reports of pain present no reports of fevers present.   Past Medical History:  Diagnosis Date  . Anxiety   . Arthritis   . Asthma   . Cancer ([place])    breast  . Depression   . Diverticulosis   . GERD (gastroesophageal reflux disease)   . HTN (hypertension)   . Shortness of breath    exertion  . Sleep apnea    STOP BANG 4  . Thyroid dysfunction     Past Surgical History:  Procedure Laterality Date  . ABDOMINAL HYSTERECTOMY    . arthroscopic surgery rt knee  1997  . BREAST SURGERY  2001   LEFT/ CANCER   . broken radius ulna  1990   left  . broken right ankle  1976  . EXPLORATORY LAPAROTOMY  08/24/2020   with partial sigmoidectomy creat of end colostomy and application of negative pressure dressing.     Marland Kitchen JOINT REPLACEMENT  right knee 2005 [facility]  . left knee  2004  . rt foot bone spur removed  1992  . THYROID SURGERY  1983  . TOTAL KNEE ARTHROPLASTY  01/22/2012   Procedure: TOTAL KNEE ARTHROPLASTY;  Surgeon: Carole Civil, MD;  Location: [facility];  Service: Orthopedics;  Laterality: Left;  . TUBAL LIGATION      Social History   Socioeconomic History  . Marital status: Widowed    Spouse name: Not on file  . Number of children: Not on file  . Years of education: Not on file  . Highest education level: Not on file  Occupational History  . Not on file  Tobacco Use  . Smoking status: Former Research scientist (life sciences)  . Smokeless tobacco: Former Network engineer  . Vaping Use: Never used  Substance and Sexual Activity  . Alcohol use: No  . Drug use: No  . Sexual activity: Never  Other Topics Concern  . Not on file   Social History Narrative  . Not on file   Social Determinants of Health   Financial Resource Strain: Not on file  Food Insecurity: Not on file  Transportation Needs: Not on file  Physical Activity: Not on file  Stress: Not on file  Social Connections: Not on file  Intimate Partner Violence: Not on file   Family History  Problem Relation Age of Onset  . Pseudochol deficiency Neg Hx   . Malignant hyperthermia Neg Hx   . Hypotension Neg Hx   . Anesthesia problems Neg Hx       VITAL SIGNS BP 120/69   Pulse 74   Temp (!) 97 F (36.1 C)   Ht '4\' 9"'  (1.448 m)   Wt 176 lb 12.8 oz (80.2 kg)   BMI 38.26 kg/m   Outpatient Encounter Medications as of 12/01/2020  Medication Sig  . acetaminophen (TYLENOL) 500 MG tablet Take 500 mg by mouth every 8 (eight) hours as needed.  . Amino Acids-Protein Hydrolys (FEEDING SUPPLEMENT, PRO-STAT 64,) LIQD Take 30 mLs by mouth in the morning and at bedtime.  Marland Kitchen atenolol (TENORMIN) 50 MG tablet Take  50 mg by mouth every evening.  Marland Kitchen BREO ELLIPTA 200-25 MCG/INH AEPB Inhale 1 puff into the lungs daily.  . chlorthalidone (HYGROTON) 25 MG tablet Take 25 mg by mouth daily.  . cholecalciferol (VITAMIN D) 1000 UNITS tablet Take 2,000 Units by mouth every morning.  . Cyanocobalamin 1000 MCG/ML KIT Inject as directed. Once a day on Saturday from 09/04/2020-10/02/2020. Then take one injection once a month starting 11/04/2020  . DULoxetine (CYMBALTA) 60 MG capsule Take 60 mg by mouth daily.  . Ensure (ENSURE) Take 1 Can by mouth 2 (two) times daily between meals.  Marland Kitchen loratadine (CLARITIN) 10 MG tablet Take 10 mg by mouth daily.  . melatonin 3 MG TABS tablet Take 3 mg by mouth at bedtime.  . Multiple Vitamins-Minerals (CENTRUM PO) Take by mouth.  . Multiple Vitamins-Minerals (PRESERVISION AREDS 2) CAPS Take 1 capsule by mouth in the morning and at bedtime.  . NON FORMULARY Diet: __x___ Regular, ______ NAS, _______Consistent Carbohydrate, _______NPO _____Other   . nystatin (MYCOSTATIN/NYSTOP) powder Apply 1 application topically daily as needed. Special Instructions: Apply thin layer of Nystatin powder to skin around stoma when changing colostomy bag PRN irritation.  . pantoprazole (PROTONIX) 40 MG tablet Take 40 mg by mouth 2 (two) times daily.  . potassium chloride SA (KLOR-CON) 20 MEQ tablet Take 40 mEq by mouth daily. 10 am give with food and full glass of liquid  . valACYclovir (VALTREX) 1000 MG tablet Take 1,000 mg by mouth 3 (three) times daily. For Shingles rash on left/right flank and left/right upper back  . [DISCONTINUED] pregabalin (LYRICA) 75 MG capsule Take 1 capsule (75 mg total) by mouth daily.   No facility-administered encounter medications on file as of 12/01/2020.     SIGNIFICANT DIAGNOSTIC EXAMS   PREVIOUS   08-21-20; ct of abdomen and pelvis:  1. Distended and fluid filled obstructed large bowel with focal transition point in the sigmoid colon, raising concern for underlying stricture or mass in this region. No evidence of pneumatosis or perforation.  2. Gastric suction tube tip terminates in the duodenum consider slight retraction  08-21-20: ct of abdomen and pelvis 1. Gallbladder/biliary layering hyperdense material in the gallbladder may reflect vicarious excretion of contrast or sludge  2. Mesentery somewhat tortuous structure that matches blood pool adjacent to the proximal SMA favored to reflect a varicosity or vascular anastomosis 3. GI tract: colonic diverticulosis without evidence of diverticulitis  4. Vascular aortobilliac atherosclerosis  5. MSK; reverse s-shaped scoliotic curvature of the spine. L1 compression fracture status post cement augmentation  Osteopenia and poly articular degenerative changes.   NO NEW LABS.   LABS REVIEWED PREVIOUS  08-19-20 vit B12: 176 08-22-20: wbc 8.2; hgb 10.3; hct 31.4; mcv 101.4 plt 151 glucose 58; bun 10 creat 0.25; k+ 4.3; na++ 132 ca 7.4 mag 1.0 phos 1.2  08-23-20: glucose  100 bun 15; creat 0.50 k+ 4.3 na++ 133; ca 8.8  mag 2.8  08-31-20: wbc 6.0; hgb 10.7; hct 32.1; mcv 102.0 plt 288; glucose 153; bun 6; creat 0.42; k+ 3.4; na++ 134  09-06-20: wbc 6.3; hgb 11.4 hct 36.2 mcv 104.9; plt 340; glucose 98 bun 10; creat 0.61; k+ 3.6; na++ 133; ca 9.2 liver normal albumin 2.6  NO NEW LABS.    Review of Systems  Unable to perform ROS: Dementia (unable to participate )    Physical Exam Constitutional:      General: She is not in acute distress.    Appearance: She is well-developed. She is  obese. She is not diaphoretic.  Neck:     Thyroid: No thyromegaly.  Cardiovascular:     Rate and Rhythm: Normal rate and regular rhythm.     Pulses: Normal pulses.     Heart sounds: Normal heart sounds.  Pulmonary:     Effort: Pulmonary effort is normal. No respiratory distress.     Breath sounds: Normal breath sounds.  Abdominal:     General: Bowel sounds are normal. There is no distension.     Palpations: Abdomen is soft.     Tenderness: There is no abdominal tenderness.     Comments: Colostomy   Musculoskeletal:        General: Normal range of motion.     Cervical back: Neck supple.     Right lower leg: No edema.     Left lower leg: No edema.     Comments: Is able to move all extremities Has scoliosis   Lymphadenopathy:     Cervical: No cervical adenopathy.  Skin:    General: Skin is warm and dry.     Comments: Has pustular rash on upper back and flank areas.   Neurological:     Mental Status: She is alert. Mental status is at baseline.  Psychiatric:        Mood and Affect: Mood normal.       ASSESSMENT/ PLAN:  TODAY   1. Herpes zoster without complications: is worse will begin valtrex 1 gm three times daily through 12-08-20    Ok Edwards NP [facility]  Contact [phone] Monday through Friday 8am- 5pm  After hours call [phone]

## 2020-12-02 DIAGNOSIS — B029 Zoster without complications: Secondary | ICD-10-CM | POA: Insufficient documentation

## 2020-12-03 ENCOUNTER — Non-Acute Institutional Stay (SKILLED_NURSING_FACILITY): Payer: Medicare Other | Admitting: Adult Health

## 2020-12-03 ENCOUNTER — Encounter: Payer: Self-pay | Admitting: Adult Health

## 2020-12-03 DIAGNOSIS — J4540 Moderate persistent asthma, uncomplicated: Secondary | ICD-10-CM

## 2020-12-03 DIAGNOSIS — K56609 Unspecified intestinal obstruction, unspecified as to partial versus complete obstruction: Secondary | ICD-10-CM | POA: Diagnosis not present

## 2020-12-03 DIAGNOSIS — Z933 Colostomy status: Secondary | ICD-10-CM

## 2020-12-03 DIAGNOSIS — M5137 Other intervertebral disc degeneration, lumbosacral region: Secondary | ICD-10-CM

## 2020-12-03 NOTE — Progress Notes (Signed)
Location:  [facility] Room Number: 239 Place of Service:  SNF (31)   CODE STATUS: dnr  No Known Allergies  Chief Complaint  Patient presents with  . Medical Management of Chronic Issues          Large bowel obstruction/ status post colostomy:  Degenerative disc disease lumbar   Moderate persistent asthma in adult    HPI:  She is a 87 year old long term resident of this facility being seen for the management of her chronic illnesses:  Large bowel obstruction/ status post colostomy:  Degenerative disc disease lumbar   Moderate persistent asthma in adult. There are no reports of uncontrolled pain; no reports of constipation; no reports of agitation or anxiety.   Past Medical History:  Diagnosis Date  . Anxiety   . Arthritis   . Asthma   . Cancer ([place])    breast  . Depression   . Diverticulosis   . GERD (gastroesophageal reflux disease)   . HTN (hypertension)   . Shortness of breath    exertion  . Sleep apnea    STOP BANG 4  . Thyroid dysfunction     Past Surgical History:  Procedure Laterality Date  . ABDOMINAL HYSTERECTOMY    . arthroscopic surgery rt knee  1997  . BREAST SURGERY  2001   LEFT/ CANCER   . broken radius ulna  1990   left  . broken right ankle  1976  . EXPLORATORY LAPAROTOMY  08/24/2020   with partial sigmoidectomy creat of end colostomy and application of negative pressure dressing.     Marland Kitchen JOINT REPLACEMENT  right knee 2005 [facility]  . left knee  2004  . rt foot bone spur removed  1992  . THYROID SURGERY  1983  . TOTAL KNEE ARTHROPLASTY  01/22/2012   Procedure: TOTAL KNEE ARTHROPLASTY;  Surgeon: Carole Civil, MD;  Location: [facility];  Service: Orthopedics;  Laterality: Left;  . TUBAL LIGATION      Social History   Socioeconomic History  . Marital status: Widowed    Spouse name: Not on file  . Number of children: Not on file  . Years of education: Not on file  . Highest education level: Not on file  Occupational  History  . Not on file  Tobacco Use  . Smoking status: Former Research scientist (life sciences)  . Smokeless tobacco: Former Network engineer  . Vaping Use: Never used  Substance and Sexual Activity  . Alcohol use: No  . Drug use: No  . Sexual activity: Never  Other Topics Concern  . Not on file  Social History Narrative  . Not on file   Social Determinants of Health   Financial Resource Strain: Not on file  Food Insecurity: Not on file  Transportation Needs: Not on file  Physical Activity: Not on file  Stress: Not on file  Social Connections: Not on file  Intimate Partner Violence: Not on file   Family History  Problem Relation Age of Onset  . Pseudochol deficiency Neg Hx   . Malignant hyperthermia Neg Hx   . Hypotension Neg Hx   . Anesthesia problems Neg Hx       VITAL SIGNS BP 120/69   Pulse 74   Temp 98 F (36.7 C)   Resp 18   Ht '4\' 9"'  (1.448 m)   Wt 176 lb 12.8 oz (80.2 kg)   BMI 38.26 kg/m   Outpatient Encounter Medications as of 12/03/2020  Medication Sig  . acetaminophen (TYLENOL) 500 MG tablet Take 500 mg by mouth every 8 (eight) hours as needed.  . Amino Acids-Protein Hydrolys (FEEDING SUPPLEMENT, PRO-STAT 64,) LIQD Take 30 mLs by mouth in the morning and at bedtime.  Marland Kitchen atenolol (TENORMIN) 50 MG tablet Take 50 mg by mouth every evening.  Marland Kitchen BREO ELLIPTA 200-25 MCG/INH AEPB Inhale 1 puff into the lungs daily.  . chlorthalidone (HYGROTON) 25 MG tablet Take 25 mg by mouth daily.  . cholecalciferol (VITAMIN D) 1000 UNITS tablet Take 2,000 Units by mouth every morning.  . Cyanocobalamin 1000 MCG/ML KIT Inject as directed. Once a day on Saturday from 09/04/2020-10/02/2020. Then take one injection once a month starting 11/04/2020  . DULoxetine (CYMBALTA) 60 MG capsule Take 60 mg by mouth daily.  . Ensure (ENSURE) Take 1 Can by mouth 2 (two) times daily between meals.  Marland Kitchen loratadine (CLARITIN) 10 MG tablet Take 10 mg by mouth daily.  . melatonin 3 MG TABS tablet Take 3 mg by mouth at  bedtime.  . Multiple Vitamins-Minerals (CENTRUM PO) Take by mouth.  . Multiple Vitamins-Minerals (PRESERVISION AREDS 2) CAPS Take 1 capsule by mouth in the morning and at bedtime.  . NON FORMULARY Diet: __x___ Regular, ______ NAS, _______Consistent Carbohydrate, _______NPO _____Other  . nystatin (MYCOSTATIN/NYSTOP) powder Apply 1 application topically daily as needed. Special Instructions: Apply thin layer of Nystatin powder to skin around stoma when changing colostomy bag PRN irritation.  . pantoprazole (PROTONIX) 40 MG tablet Take 40 mg by mouth 2 (two) times daily.  . potassium chloride SA (KLOR-CON) 20 MEQ tablet Take 40 mEq by mouth daily. 10 am give with food and full glass of liquid  . valACYclovir (VALTREX) 1000 MG tablet Take 1,000 mg by mouth 3 (three) times daily. For Shingles rash on left/right flank and left/right upper back   No facility-administered encounter medications on file as of 12/03/2020.     SIGNIFICANT DIAGNOSTIC EXAMS   PREVIOUS   08-21-20; ct of abdomen and pelvis:  1. Distended and fluid filled obstructed large bowel with focal transition point in the sigmoid colon, raising concern for underlying stricture or mass in this region. No evidence of pneumatosis or perforation.  2. Gastric suction tube tip terminates in the duodenum consider slight retraction  08-21-20: ct of abdomen and pelvis 1. Gallbladder/biliary layering hyperdense material in the gallbladder may reflect vicarious excretion of contrast or sludge  2. Mesentery somewhat tortuous structure that matches blood pool adjacent to the proximal SMA favored to reflect a varicosity or vascular anastomosis 3. GI tract: colonic diverticulosis without evidence of diverticulitis  4. Vascular aortobilliac atherosclerosis  5. MSK; reverse s-shaped scoliotic curvature of the spine. L1 compression fracture status post cement augmentation  Osteopenia and poly articular degenerative changes.   NO NEW LABS.    LABS REVIEWED PREVIOUS  08-19-20 vit B12: 176 08-22-20: wbc 8.2; hgb 10.3; hct 31.4; mcv 101.4 plt 151 glucose 58; bun 10 creat 0.25; k+ 4.3; na++ 132 ca 7.4 mag 1.0 phos 1.2  08-23-20: glucose 100 bun 15; creat 0.50 k+ 4.3 na++ 133; ca 8.8  mag 2.8  08-31-20: wbc 6.0; hgb 10.7; hct 32.1; mcv 102.0 plt 288; glucose 153; bun 6; creat 0.42; k+ 3.4; na++ 134  09-06-20: wbc 6.3; hgb 11.4 hct 36.2 mcv 104.9; plt 340; glucose 98 bun 10; creat 0.61; k+ 3.6; na++ 133; ca 9.2 liver normal albumin 2.6  NO NEW LABS.    Review of Systems  Unable to perform ROS:  Dementia (unable to participate )   Physical Exam Constitutional:      General: She is not in acute distress.    Appearance: She is well-developed. She is not diaphoretic.  Neck:     Thyroid: No thyromegaly.  Cardiovascular:     Rate and Rhythm: Normal rate and regular rhythm.     Pulses: Normal pulses.     Heart sounds: Normal heart sounds.  Pulmonary:     Effort: Pulmonary effort is normal. No respiratory distress.     Breath sounds: Normal breath sounds.  Abdominal:     General: Bowel sounds are normal. There is no distension.     Palpations: Abdomen is soft.     Tenderness: There is no abdominal tenderness.     Comments: Colostomy   Musculoskeletal:        General: Normal range of motion.     Cervical back: Neck supple.     Right lower leg: No edema.     Left lower leg: No edema.     Comments:  Is able to move all extremities Has scoliosis    Lymphadenopathy:     Cervical: No cervical adenopathy.  Skin:    General: Skin is warm and dry.  Neurological:     Mental Status: She is alert. Mental status is at baseline.  Psychiatric:        Mood and Affect: Mood normal.      ASSESSMENT/ PLAN:  TODAY  1. Large bowel obstruction/status post exploratory lap/status post colostomy: is stable will continue to monitor her status.   2. Degenerative disc disease lumbar is stable will continue lyrica 75 mg nightly; cymbalta  60 mg daily   3. Moderate persistent asthma in adult is stable will continue breo ellipta 200/25 mcg 1 puff daily    PREVIOUS   4. Non-seasonal allergic rhinitis unspecified trigger: is stable will continue zyrtec 10 mg daily flonase daily  5. GERD without esophagitis: is stable will continue protonix 40 mg daily   6. Essential hypertension: is stable b/p 120/69 will continue tenormin 50 mg daily hygroton 25 mg daily   7. Hypokalemia: is stable k+ 3.6 will continue k+ 40 meq daily  8. Major depression recurrent chronic: is stable will continue cymbalta 60 mg daily melatonin 3 mg nightly for sleep is off ativan   9. Aortoiliac atherosclerosis (CT 08-21-20); will monitor    Ok Edwards NP [facility]  Contact [phone] Monday through Friday 8am- 5pm  After hours call [phone]

## 2020-12-16 ENCOUNTER — Encounter: Payer: Self-pay | Admitting: Adult Health

## 2020-12-16 ENCOUNTER — Non-Acute Institutional Stay (SKILLED_NURSING_FACILITY): Payer: Medicare Other | Admitting: Adult Health

## 2020-12-16 DIAGNOSIS — I700 Atherosclerosis of aorta: Secondary | ICD-10-CM | POA: Diagnosis not present

## 2020-12-16 DIAGNOSIS — E43 Unspecified severe protein-calorie malnutrition: Secondary | ICD-10-CM

## 2020-12-16 DIAGNOSIS — I708 Atherosclerosis of other arteries: Secondary | ICD-10-CM

## 2020-12-16 DIAGNOSIS — Z933 Colostomy status: Secondary | ICD-10-CM | POA: Diagnosis not present

## 2020-12-16 DIAGNOSIS — J4540 Moderate persistent asthma, uncomplicated: Secondary | ICD-10-CM | POA: Diagnosis not present

## 2020-12-16 NOTE — Progress Notes (Signed)
Location:  [facility] Room Number: 354 Place of Service:  SNF (31)   CODE STATUS: dnr   No Known Allergies  Chief Complaint  Patient presents with  . Acute Visit     Care plan meeting     HPI:  We have come together for her care plan meeting. BIMS 11/15 mood 5/30. Is hard of hearing. Will setup appointment to replace hearing aids  Family present  she is nonambulatory she require limited to extensive assist with her adls. She is able to feed herself. She is frequently incontinent of bladder; colostomy. She has had two falls: #1 10-02-20: no injury #2 09-22-20 no injury.  Therapy none.   Weight regular diet; good appetite; protein supplement; is regaining her weight at 176 pounds   She prefers to stay in her room.   She will continue to be followed for her chronic illnesses including: Aortoiliac atherosclerosis  Colostomy status   Protein calorie malnutrition severe Moderate persistent asthma in adult without complication.   Past Medical History:  Diagnosis Date  . Anxiety   . Arthritis   . Asthma   . Cancer ([place])    breast  . Depression   . Diverticulosis   . GERD (gastroesophageal reflux disease)   . HTN (hypertension)   . Shortness of breath    exertion  . Sleep apnea    STOP BANG 4  . Thyroid dysfunction     Past Surgical History:  Procedure Laterality Date  . ABDOMINAL HYSTERECTOMY    . arthroscopic surgery rt knee  1997  . BREAST SURGERY  2001   LEFT/ CANCER   . broken radius ulna  1990   left  . broken right ankle  1976  . EXPLORATORY LAPAROTOMY  08/24/2020   with partial sigmoidectomy creat of end colostomy and application of negative pressure dressing.     Marland Kitchen JOINT REPLACEMENT  right knee 2005 [place]  . left knee  2004  . rt foot bone spur removed  1992  . THYROID SURGERY  1983  . TOTAL KNEE ARTHROPLASTY  01/22/2012   Procedure: TOTAL KNEE ARTHROPLASTY;  Surgeon: Carole Civil, MD;  Location: [facility];  Service: Orthopedics;   Laterality: Left;  . TUBAL LIGATION      Social History   Socioeconomic History  . Marital status: Widowed    Spouse name: Not on file  . Number of children: Not on file  . Years of education: Not on file  . Highest education level: Not on file  Occupational History  . Not on file  Tobacco Use  . Smoking status: Former Research scientist (life sciences)  . Smokeless tobacco: Former Network engineer  . Vaping Use: Never used  Substance and Sexual Activity  . Alcohol use: No  . Drug use: No  . Sexual activity: Never  Other Topics Concern  . Not on file  Social History Narrative  . Not on file   Social Determinants of Health   Financial Resource Strain: Not on file  Food Insecurity: Not on file  Transportation Needs: Not on file  Physical Activity: Not on file  Stress: Not on file  Social Connections: Not on file  Intimate Partner Violence: Not on file   Family History  Problem Relation Age of Onset  . Pseudochol deficiency Neg Hx   . Malignant hyperthermia Neg Hx   . Hypotension Neg Hx   . Anesthesia problems Neg Hx       VITAL SIGNS  BP 120/70   Pulse 77   Temp (!) 96.9 F (36.1 C)   Resp 18   Ht '4\' 9"'  (1.448 m)   Wt 176 lb 12.8 oz (80.2 kg)   SpO2 95%   BMI 38.26 kg/m   Outpatient Encounter Medications as of 12/16/2020  Medication Sig  . acetaminophen (TYLENOL) 500 MG tablet Take 500 mg by mouth every 8 (eight) hours as needed.  . Amino Acids-Protein Hydrolys (FEEDING SUPPLEMENT, PRO-STAT 64,) LIQD Take 30 mLs by mouth in the morning and at bedtime.  Marland Kitchen atenolol (TENORMIN) 50 MG tablet Take 50 mg by mouth every evening.  Marland Kitchen BREO ELLIPTA 200-25 MCG/INH AEPB Inhale 1 puff into the lungs daily.  . chlorthalidone (HYGROTON) 25 MG tablet Take 25 mg by mouth daily.  . cholecalciferol (VITAMIN D) 1000 UNITS tablet Take 2,000 Units by mouth every morning.  . Cyanocobalamin 1000 MCG/ML KIT Inject as directed. Once a day on Saturday from 09/04/2020-10/02/2020. Then take one injection once a  month starting 11/04/2020  . DULoxetine (CYMBALTA) 60 MG capsule Take 60 mg by mouth daily.  . Ensure (ENSURE) Take 1 Can by mouth 2 (two) times daily between meals.  Marland Kitchen loratadine (CLARITIN) 10 MG tablet Take 10 mg by mouth daily.  . melatonin 3 MG TABS tablet Take 3 mg by mouth at bedtime.  . Multiple Vitamins-Minerals (CENTRUM PO) Take by mouth.  . Multiple Vitamins-Minerals (PRESERVISION AREDS 2) CAPS Take 1 capsule by mouth in the morning and at bedtime.  . NON FORMULARY Diet: __x___ Regular, ______ NAS, _______Consistent Carbohydrate, _______NPO _____Other  . nystatin (MYCOSTATIN/NYSTOP) powder Apply 1 application topically daily as needed. Special Instructions: Apply thin layer of Nystatin powder to skin around stoma when changing colostomy bag PRN irritation.  . pantoprazole (PROTONIX) 40 MG tablet Take 40 mg by mouth 2 (two) times daily.  . potassium chloride SA (KLOR-CON) 20 MEQ tablet Take 40 mEq by mouth daily. 10 am give with food and full glass of liquid   No facility-administered encounter medications on file as of 12/16/2020.     SIGNIFICANT DIAGNOSTIC EXAMS   PREVIOUS   08-21-20; ct of abdomen and pelvis:  1. Distended and fluid filled obstructed large bowel with focal transition point in the sigmoid colon, raising concern for underlying stricture or mass in this region. No evidence of pneumatosis or perforation.  2. Gastric suction tube tip terminates in the duodenum consider slight retraction  08-21-20: ct of abdomen and pelvis 1. Gallbladder/biliary layering hyperdense material in the gallbladder may reflect vicarious excretion of contrast or sludge  2. Mesentery somewhat tortuous structure that matches blood pool adjacent to the proximal SMA favored to reflect a varicosity or vascular anastomosis 3. GI tract: colonic diverticulosis without evidence of diverticulitis  4. Vascular aortobilliac atherosclerosis  5. MSK; reverse s-shaped scoliotic curvature of the  spine. L1 compression fracture status post cement augmentation  Osteopenia and poly articular degenerative changes.   NO NEW LABS.   LABS REVIEWED PREVIOUS  08-19-20 vit B12: 176 08-22-20: wbc 8.2; hgb 10.3; hct 31.4; mcv 101.4 plt 151 glucose 58; bun 10 creat 0.25; k+ 4.3; na++ 132 ca 7.4 mag 1.0 phos 1.2  08-23-20: glucose 100 bun 15; creat 0.50 k+ 4.3 na++ 133; ca 8.8  mag 2.8  08-31-20: wbc 6.0; hgb 10.7; hct 32.1; mcv 102.0 plt 288; glucose 153; bun 6; creat 0.42; k+ 3.4; na++ 134  09-06-20: wbc 6.3; hgb 11.4 hct 36.2 mcv 104.9; plt 340; glucose 98 bun 10; creat  0.61; k+ 3.6; na++ 133; ca 9.2 liver normal albumin 2.6  NO NEW LABS.    Review of Systems  Unable to perform ROS: Dementia (unable to fully participate )   Physical Exam Constitutional:      General: She is not in acute distress.    Appearance: She is well-developed and overweight. She is not diaphoretic.  Neck:     Thyroid: No thyromegaly.  Cardiovascular:     Rate and Rhythm: Normal rate and regular rhythm.     Pulses: Normal pulses.     Heart sounds: Normal heart sounds.  Pulmonary:     Effort: Pulmonary effort is normal. No respiratory distress.     Breath sounds: Normal breath sounds.  Abdominal:     General: Bowel sounds are normal. There is no distension.     Palpations: Abdomen is soft.     Tenderness: There is no abdominal tenderness.     Comments: Colostomy   Musculoskeletal:     Cervical back: Neck supple.     Right lower leg: No edema.     Left lower leg: No edema.     Comments:  Is able to move all extremities Has scoliosis   Lymphadenopathy:     Cervical: No cervical adenopathy.  Skin:    General: Skin is warm and dry.  Neurological:     Mental Status: She is alert. Mental status is at baseline.  Psychiatric:        Mood and Affect: Mood normal.       ASSESSMENT/ PLAN:  TODAY  1. Aortoiliac atherosclerosis 2. Colostomy status 3.  Protein calorie malnutrition severe 4. Moderate  persistent asthma in adult without complication.   Will continue current plan of care Will continue current medications Will continue to monitor her status.   Time spent with patient and family: 40 minutes: >50% spent with counseling regarding; medications; mentation; therapy and medical status.    Ok Edwards NP [facility] Adult Medicine  Contact [phone] Monday through Friday 8am- 5pm  After hours call [phone]

## 2021-01-04 ENCOUNTER — Non-Acute Institutional Stay (SKILLED_NURSING_FACILITY): Payer: Medicare Other | Admitting: Adult Health

## 2021-01-04 ENCOUNTER — Encounter: Payer: Self-pay | Admitting: Adult Health

## 2021-01-04 DIAGNOSIS — J3089 Other allergic rhinitis: Secondary | ICD-10-CM

## 2021-01-04 DIAGNOSIS — I10 Essential (primary) hypertension: Secondary | ICD-10-CM

## 2021-01-04 DIAGNOSIS — K219 Gastro-esophageal reflux disease without esophagitis: Secondary | ICD-10-CM | POA: Diagnosis not present

## 2021-01-04 NOTE — Progress Notes (Signed)
Location:  [facility] Room Number: 144-P Place of Service:  SNF (31)   CODE STATUS: DNR  No Known Allergies  Chief Complaint  Patient presents with  . Medical Management of Chronic Issues          Non-seasonal allergic rhinitis unspecified trigger:   GERD without esophagitis:  Essential hypertension:    HPI:  She is a 87 year old long term resident of this facility being seen for the management of her chronic illnesses:  Non-seasonal allergic rhinitis unspecified trigger:   GERD without esophagitis:  Essential hypertension. There are no reports of changes in appetite; no uncontrolled pain; no anxiety or agitation.   Past Medical History:  Diagnosis Date  . Anxiety   . Arthritis   . Asthma   . Cancer ([place])    breast  . Depression   . Diverticulosis   . GERD (gastroesophageal reflux disease)   . HTN (hypertension)   . Shortness of breath    exertion  . Sleep apnea    STOP BANG 4  . Thyroid dysfunction     Past Surgical History:  Procedure Laterality Date  . ABDOMINAL HYSTERECTOMY    . arthroscopic surgery rt knee  1997  . BREAST SURGERY  2001   LEFT/ CANCER   . broken radius ulna  1990   left  . broken right ankle  1976  . EXPLORATORY LAPAROTOMY  08/24/2020   with partial sigmoidectomy creat of end colostomy and application of negative pressure dressing.     Marland Kitchen JOINT REPLACEMENT  right knee 2005 [place]  . left knee  2004  . rt foot bone spur removed  1992  . THYROID SURGERY  1983  . TOTAL KNEE ARTHROPLASTY  01/22/2012   Procedure: TOTAL KNEE ARTHROPLASTY;  Surgeon: Carole Civil, MD;  Location: [facility];  Service: Orthopedics;  Laterality: Left;  . TUBAL LIGATION      Social History   Socioeconomic History  . Marital status: Widowed    Spouse name: Not on file  . Number of children: Not on file  . Years of education: Not on file  . Highest education level: Not on file  Occupational History  . Not on file  Tobacco Use  .  Smoking status: Former Research scientist (life sciences)  . Smokeless tobacco: Former Network engineer  . Vaping Use: Never used  Substance and Sexual Activity  . Alcohol use: No  . Drug use: No  . Sexual activity: Never  Other Topics Concern  . Not on file  Social History Narrative  . Not on file   Social Determinants of Health   Financial Resource Strain: Not on file  Food Insecurity: Not on file  Transportation Needs: Not on file  Physical Activity: Not on file  Stress: Not on file  Social Connections: Not on file  Intimate Partner Violence: Not on file   Family History  Problem Relation Age of Onset  . Pseudochol deficiency Neg Hx   . Malignant hyperthermia Neg Hx   . Hypotension Neg Hx   . Anesthesia problems Neg Hx       VITAL SIGNS BP 112/65   Pulse 73   Temp (!) 96.9 F (36.1 C)   Resp 20   Ht '4\' 9"'  (1.448 m)   Wt 176 lb 3.2 oz (79.9 kg)   SpO2 99%   BMI 38.13 kg/m   Outpatient Encounter Medications as of 01/04/2021  Medication Sig  . acetaminophen (TYLENOL) 500 MG  tablet Take 500 mg by mouth every 8 (eight) hours as needed.  . Amino Acids-Protein Hydrolys (FEEDING SUPPLEMENT, PRO-STAT 64,) LIQD Take 30 mLs by mouth in the morning and at bedtime.  Marland Kitchen atenolol (TENORMIN) 50 MG tablet Take 50 mg by mouth every evening.  Marland Kitchen BREO ELLIPTA 200-25 MCG/INH AEPB Inhale 1 puff into the lungs daily.  . chlorthalidone (HYGROTON) 25 MG tablet Take 25 mg by mouth daily.  . cholecalciferol (VITAMIN D) 1000 UNITS tablet Take 2,000 Units by mouth every morning.  . Cyanocobalamin 1000 MCG/ML KIT Inject as directed. Once a day on Saturday from 09/04/2020-10/02/2020. Then take one injection once a month starting 11/04/2020  . DULoxetine (CYMBALTA) 60 MG capsule Take 60 mg by mouth daily.  . Ensure (ENSURE) Take 1 Can by mouth 2 (two) times daily between meals.  Marland Kitchen loratadine (CLARITIN) 10 MG tablet Take 10 mg by mouth daily.  . melatonin 3 MG TABS tablet Take 3 mg by mouth at bedtime.  . Multiple  Vitamins-Minerals (CENTRUM PO) Take by mouth.  . Multiple Vitamins-Minerals (PRESERVISION AREDS 2) CAPS Take 1 capsule by mouth in the morning and at bedtime.  . NON FORMULARY Diet: __x___ Regular, ______ NAS, _______Consistent Carbohydrate, _______NPO _____Other  . nystatin (MYCOSTATIN/NYSTOP) powder Apply 1 application topically daily as needed. Special Instructions: Apply thin layer of Nystatin powder to skin around stoma when changing colostomy bag PRN irritation.  . pantoprazole (PROTONIX) 40 MG tablet Take 40 mg by mouth 2 (two) times daily.  . potassium chloride SA (KLOR-CON) 20 MEQ tablet Take 40 mEq by mouth daily. 10 am give with food and full glass of liquid   No facility-administered encounter medications on file as of 01/04/2021.     SIGNIFICANT DIAGNOSTIC EXAMS   PREVIOUS   08-21-20; ct of abdomen and pelvis:  1. Distended and fluid filled obstructed large bowel with focal transition point in the sigmoid colon, raising concern for underlying stricture or mass in this region. No evidence of pneumatosis or perforation.  2. Gastric suction tube tip terminates in the duodenum consider slight retraction  08-21-20: ct of abdomen and pelvis 1. Gallbladder/biliary layering hyperdense material in the gallbladder may reflect vicarious excretion of contrast or sludge  2. Mesentery somewhat tortuous structure that matches blood pool adjacent to the proximal SMA favored to reflect a varicosity or vascular anastomosis 3. GI tract: colonic diverticulosis without evidence of diverticulitis  4. Vascular aortobilliac atherosclerosis  5. MSK; reverse s-shaped scoliotic curvature of the spine. L1 compression fracture status post cement augmentation  Osteopenia and poly articular degenerative changes.   NO NEW LABS.   LABS REVIEWED PREVIOUS  08-19-20 vit B12: 176 08-22-20: wbc 8.2; hgb 10.3; hct 31.4; mcv 101.4 plt 151 glucose 58; bun 10 creat 0.25; k+ 4.3; na++ 132 ca 7.4 mag 1.0 phos  1.2  08-23-20: glucose 100 bun 15; creat 0.50 k+ 4.3 na++ 133; ca 8.8  mag 2.8  08-31-20: wbc 6.0; hgb 10.7; hct 32.1; mcv 102.0 plt 288; glucose 153; bun 6; creat 0.42; k+ 3.4; na++ 134  09-06-20: wbc 6.3; hgb 11.4 hct 36.2 mcv 104.9; plt 340; glucose 98 bun 10; creat 0.61; k+ 3.6; na++ 133; ca 9.2 liver normal albumin 2.6  NO NEW LABS.    Review of Systems  Unable to perform ROS: Dementia (unable to fully participate )     Physical Exam Constitutional:      General: She is not in acute distress.    Appearance: She is well-developed. She is  not diaphoretic.  Neck:     Thyroid: No thyromegaly.  Cardiovascular:     Rate and Rhythm: Normal rate and regular rhythm.     Heart sounds: Normal heart sounds.  Pulmonary:     Effort: Pulmonary effort is normal. No respiratory distress.     Breath sounds: Normal breath sounds.  Abdominal:     General: Bowel sounds are normal. There is no distension.     Palpations: Abdomen is soft.     Tenderness: There is no abdominal tenderness.     Comments:  Colostomy    Musculoskeletal:     Cervical back: Neck supple.     Right lower leg: No edema.     Left lower leg: No edema.     Comments: Is able to move all extremities Has scoliosis   Lymphadenopathy:     Cervical: No cervical adenopathy.  Skin:    General: Skin is warm and dry.  Neurological:     Mental Status: She is alert. Mental status is at baseline.  Psychiatric:        Mood and Affect: Mood normal.       ASSESSMENT/ PLAN:  TODAY  1. Non-seasonal allergic rhinitis unspecified trigger: is stable will continue zyrtec 10 mg daily flonase daily  2. GERD without esophagitis: is stable will continue protonix 40 mg daily   3. Essential hypertension: is stable b/p 112/65 will continue tenormin 50 mg daily and hygroton 25 mg daily    PREVIOUS   4. Hypokalemia: is stable k+ 3.6 will continue k+ 40 meq daily  5. Major depression recurrent chronic: is stable will continue  cymbalta 60 mg daily melatonin 3 mg nightly for sleep is off ativan   6. Aortoiliac atherosclerosis (CT 08-21-20); will monitor   7. Large bowel obstruction/status post exploratory lap/status post colostomy: is stable will continue to monitor her status.   8. Degenerative disc disease lumbar is stable will continue lyrica 75 mg nightly; cymbalta 60 mg daily   9. Moderate persistent asthma in adult is stable will continue breo ellipta 200/25 mcg 1 puff daily    Will check cbc; cmp   Ok Edwards NP [facility] Adult Medicine  Contact [phone] Monday through Friday 8am- 5pm  After hours call [phone]

## 2021-01-06 ENCOUNTER — Other Ambulatory Visit (HOSPITAL_COMMUNITY)
Admission: RE | Admit: 2021-01-06 | Discharge: 2021-01-06 | Disposition: A | Discharge status: Home / Self Care | Payer: Medicare Other | Source: Skilled Nursing Facility | Attending: Adult Health | Admitting: Adult Health

## 2021-01-06 DIAGNOSIS — I10 Essential (primary) hypertension: Secondary | ICD-10-CM | POA: Insufficient documentation

## 2021-01-06 LAB — CBC
HCT: 44.2 % (ref 36.0–46.0)
Hemoglobin: 14.5 g/dL (ref 12.0–15.0)
MCH: 33.5 pg (ref 26.0–34.0)
MCHC: 32.8 g/dL (ref 30.0–36.0)
MCV: 102.1 fL — ABNORMAL HIGH (ref 80.0–100.0)
Platelets: 257 10*3/uL (ref 150–400)
RBC: 4.33 MIL/uL (ref 3.87–5.11)
RDW: 13 % (ref 11.5–15.5)
WBC: 6.6 10*3/uL (ref 4.0–10.5)
nRBC: 0 % (ref 0.0–0.2)

## 2021-01-06 LAB — COMPREHENSIVE METABOLIC PANEL
ALT: 14 U/L (ref 0–44)
AST: 19 U/L (ref 15–41)
Albumin: 3.6 g/dL (ref 3.5–5.0)
Alkaline Phosphatase: 73 U/L (ref 38–126)
Anion gap: 12 (ref 5–15)
BUN: 28 mg/dL — ABNORMAL HIGH (ref 8–23)
CO2: 27 mmol/L (ref 22–32)
Calcium: 9.5 mg/dL (ref 8.9–10.3)
Chloride: 99 mmol/L (ref 98–111)
Creatinine, Ser: 0.63 mg/dL (ref 0.44–1.00)
GFR, Estimated: >60 mL/min (ref >60)
Glucose, Bld: 117 mg/dL — ABNORMAL HIGH (ref 70–99)
Potassium: 3.1 mmol/L — ABNORMAL LOW (ref 3.5–5.1)
Sodium: 138 mmol/L (ref 135–145)
Total Bilirubin: 0.8 mg/dL (ref 0.3–1.2)
Total Protein: 6.9 g/dL (ref 6.5–8.1)

## 2021-01-11 ENCOUNTER — Encounter: Payer: Self-pay | Admitting: Adult Health

## 2021-01-11 ENCOUNTER — Non-Acute Institutional Stay (SKILLED_NURSING_FACILITY): Payer: Medicare Other | Admitting: Adult Health

## 2021-01-11 DIAGNOSIS — Z0000 Encounter for general adult medical examination without abnormal findings: Secondary | ICD-10-CM | POA: Diagnosis not present

## 2021-01-11 NOTE — Progress Notes (Signed)
Subjective:   [name] is a 87 y.o. female who presents for Medicare Annual (Subsequent) preventive examination.  Review of Systems    Review of Systems  Unable to perform ROS: Dementia (unable to fully complete )    Cardiac Risk Factors include: advanced age (>45mn, >>61women);obesity (BMI >30kg/m2);sedentary lifestyle     Objective:    Today's Vitals   01/11/21 1114  BP: (!) 119/50  Pulse: 70  Resp: 18  Temp: (!) 96.7 F (35.9 C)  Weight: 176 lb 3.2 oz (79.9 kg)  Height: '4\' 9"'  (1.448 m)   Body mass index is 38.13 kg/m.  Advanced Directives 01/04/2021 12/01/2020 10/08/2020 09/23/2020 09/15/2020 09/08/2020 09/07/2020  Does Patient Have a Medical Advance Directive? Yes Yes Yes No No No No  Type of Advance Directive - Out of facility DNR (pink MOST or yellow form) Out of facility DNR (pink MOST or yellow form) - - - -  Does patient want to make changes to medical advance directive? No - Patient declined No - Patient declined No - Patient declined No - Patient declined No - Patient declined - -  Would patient like information on creating a medical advance directive? - - - - - No - Patient declined No - Patient declined  Pre-existing out of facility DNR order (yellow form or pink MOST form) Yellow form placed in chart (order not valid for inpatient use) - - - - - -    Current Medications (verified) Outpatient Encounter Medications as of 01/11/2021  Medication Sig  . acetaminophen (TYLENOL) 500 MG tablet Take 500 mg by mouth every 8 (eight) hours as needed.  . Amino Acids-Protein Hydrolys (FEEDING SUPPLEMENT, PRO-STAT 64,) LIQD Take 30 mLs by mouth in the morning and at bedtime.  .Marland Kitchenatenolol (TENORMIN) 50 MG tablet Take 50 mg by mouth every evening.  .Marland KitchenBREO ELLIPTA 200-25 MCG/INH AEPB Inhale 1 puff into the lungs daily.  . chlorthalidone (HYGROTON) 25 MG tablet Take 25 mg by mouth daily.  . cholecalciferol (VITAMIN D) 1000 UNITS tablet Take 2,000 Units by mouth every  morning.  . Cyanocobalamin 1000 MCG/ML KIT Inject as directed. Once a day on Saturday from 09/04/2020-10/02/2020. Then take one injection once a month starting 11/04/2020  . DULoxetine (CYMBALTA) 60 MG capsule Take 60 mg by mouth daily.  . Ensure (ENSURE) Take 1 Can by mouth 2 (two) times daily between meals.  .Marland Kitchenloratadine (CLARITIN) 10 MG tablet Take 10 mg by mouth daily.  . melatonin 3 MG TABS tablet Take 3 mg by mouth at bedtime.  . Multiple Vitamins-Minerals (CENTRUM PO) Take by mouth.  . Multiple Vitamins-Minerals (PRESERVISION AREDS 2) CAPS Take 1 capsule by mouth in the morning and at bedtime.  . NON FORMULARY Diet: __x___ Regular, ______ NAS, _______Consistent Carbohydrate, _______NPO _____Other  . nystatin (MYCOSTATIN/NYSTOP) powder Apply 1 application topically daily as needed. Special Instructions: Apply thin layer of Nystatin powder to skin around stoma when changing colostomy bag PRN irritation.  . pantoprazole (PROTONIX) 40 MG tablet Take 40 mg by mouth 2 (two) times daily.  . potassium chloride SA (KLOR-CON) 20 MEQ tablet Take 40 mEq by mouth daily. 10 am give with food and full glass of liquid   No facility-administered encounter medications on file as of 01/11/2021.    Allergies (verified) Patient has no known allergies.   History: Past Medical History:  Diagnosis Date  . Anxiety   . Arthritis   . Asthma   . Cancer ([place]  breast  . Depression   . Diverticulosis   . GERD (gastroesophageal reflux disease)   . HTN (hypertension)   . Shortness of breath    exertion  . Sleep apnea    STOP BANG 4  . Thyroid dysfunction    Past Surgical History:  Procedure Laterality Date  . ABDOMINAL HYSTERECTOMY    . arthroscopic surgery rt knee  1997  . BREAST SURGERY  2001   LEFT/ CANCER   . broken radius ulna  1990   left  . broken right ankle  1976  . EXPLORATORY LAPAROTOMY  08/24/2020   with partial sigmoidectomy creat of end colostomy and application of negative  pressure dressing.     Marland Kitchen JOINT REPLACEMENT  right knee 2005 [place]  . left knee  2004  . rt foot bone spur removed  1992  . THYROID SURGERY  1983  . TOTAL KNEE ARTHROPLASTY  01/22/2012   Procedure: TOTAL KNEE ARTHROPLASTY;  Surgeon: Carole Civil, MD;  Location: [facility];  Service: Orthopedics;  Laterality: Left;  . TUBAL LIGATION     Family History  Problem Relation Age of Onset  . Pseudochol deficiency Neg Hx   . Malignant hyperthermia Neg Hx   . Hypotension Neg Hx   . Anesthesia problems Neg Hx    Social History   Socioeconomic History  . Marital status: Widowed    Spouse name: Not on file  . Number of children: Not on file  . Years of education: Not on file  . Highest education level: Not on file  Occupational History  . Not on file  Tobacco Use  . Smoking status: Former Research scientist (life sciences)  . Smokeless tobacco: Former Network engineer  . Vaping Use: Never used  Substance and Sexual Activity  . Alcohol use: No  . Drug use: No  . Sexual activity: Never  Other Topics Concern  . Not on file  Social History Narrative  . Not on file   Social Determinants of Health   Financial Resource Strain: Not on file  Food Insecurity: Not on file  Transportation Needs: Not on file  Physical Activity: Not on file  Stress: Not on file  Social Connections: Not on file    Tobacco Counseling Counseling given: Not Answered   Clinical Intake:  Pre-visit preparation completed: Yes  Pain : No/denies pain     BMI - recorded: 38.13 Nutritional Status: BMI > 30  Obese Nutritional Risks: Unintentional weight gain Diabetes: No  How often do you need to have someone help you when you read instructions, pamphlets, or other written materials from your doctor or pharmacy?: 5 - [place] Needed?: No      Activities of Daily Living In your present state of health, do you have any difficulty performing the following activities: 01/11/2021  Hearing? N  Vision?  N  Difficulty concentrating or making decisions? Y  Walking or climbing stairs? Y  Dressing or bathing? Y  Doing errands, shopping? Y  Preparing Food and eating ? Y  Using the Toilet? Y  In the past six months, have you accidently leaked urine? Y  Do you have problems with loss of bowel control? Y  Managing your Medications? Y  Managing your Finances? Y  Housekeeping or managing your Housekeeping? Y  Some recent data might be hidden    Patient Care Team: Caryl Bis, MD as PCP - General (Unknown Physician Specialty)  Indicate any recent Medical Services you may have  received from other than [facility] providers in the past year (date may be approximate).     Assessment:   This is a routine wellness examination for [place].  Hearing/Vision screen No exam data present  Dietary issues and exercise activities discussed: Current Exercise Habits: The patient does not participate in regular exercise at present  Goals    . Absence of Fall and Fall-Related Injury     Evidence-based guidance:   Assess fall risk using a validated tool when available. Consider balance and gait impairment, muscle weakness, diminished vision or hearing, environmental hazards, presence of urinary or bowel urgency and/or incontinence.   Communicate fall injury risk to interprofessional healthcare team.   Develop a fall prevention plan with the patient and family.   Promote use of personal vision and auditory aids.   Promote reorientation, appropriate sensory stimulation, and routines to decrease risk of fall when changes in mental status are present.   Assess assistance level required for safe and effective self-care; consider referral for home care.   Encourage physical activity, such as performance of self-care at highest level of ability, strength and balance exercise program, and provision of appropriate assistive devices; refer to rehabilitation therapy.   Refer to community-based fall prevention  program where available.   If fall occurs, determine the cause and revise fall injury prevention plan.   Regularly review medication contribution to fall risk; consider risk related to polypharmacy and age.   Refer to pharmacist for consultation when concerns about medications are revealed.   Balance adequate pain management with potential for oversedation.   Provide guidance related to environmental modifications.   Consider supplementation with Vitamin D.   Notes:     . Follow up with Provider as scheduled    . General - Client will not be readmitted within 30 days ([facility])      Depression Screen PHQ 2/9 Scores 01/11/2021  PHQ - 2 Score 0    Fall Risk Fall Risk  01/11/2021  Falls in the past year? 1  Number falls in past yr: 0  Injury with Fall? 0  Risk for fall due to : History of fall(s);Impaired balance/gait;Impaired mobility  Follow up Falls evaluation completed    FALL RISK PREVENTION PERTAINING TO THE HOME:  Any stairs in or around the home?no If so, are there any without handrails?n/a Home free of loose throw rugs in walkways, pet beds, electrical cords, etc?yes  Adequate lighting in your home to reduce risk of falls?yes  ASSISTIVE DEVICES UTILIZED TO PREVENT FALLS:  Life alert?n/a Use of a cane, walker or w/c? Wheelchair  Grab bars in the bathroom? Yes  Shower chair or bench in shower? Yes  Elevated toilet seat or a handicapped toilet? Yes   TIMED UP AND GO:  Was the test performed? no; wheelchair bound.   Cognitive Function: MMSE - Mini Mental State Exam 01/11/2021  Not completed: Unable to complete        Immunizations Immunization History  Administered Date(s) Administered  . Influenza-Unspecified 06/23/2020  . Moderna SARS-COV2 Booster Vaccination 09/23/2020  . Moderna Sars-Covid-2 Vaccination 01/15/2020, 02/12/2020  . Pneumococcal Conjugate-13 02/15/2011  . Pneumococcal Polysaccharide-23 05/23/2018  . Tdap 02/15/2011  . Zoster Recombinat  (Shingrix) 05/23/2018, 06/23/2020   Vaccines per facility   Screening Tests Health Maintenance  Topic Date Due  . DEXA SCAN  Never done  . TETANUS/TDAP  02/14/2021  . INFLUENZA VACCINE  04/25/2021  . COVID-19 Vaccine  Completed  . PNA vac Low Risk Adult  Completed  .  HPV VACCINES  Aged Out    Health Maintenance  Health Maintenance Due  Topic Date Due  . DEXA SCAN  Never done    Lung Cancer Screening: (Low Dose CT Chest recommended if Age 63-80 years, 30 pack-year currently smoking OR have quit w/in 15years.) does not  qualify.   Lung Cancer Screening Referral: no   Additional Screening:  Hepatitis C Screening: does not  qualify; Completed n/a  Vision Screening: Recommended annual ophthalmology exams for early detection of glaucoma and other disorders of the eye. Is the patient up to date with their annual eye exam? yes Who is the provider or what is the name of the office in which the patient attends annual eye exams? Per facility  If pt is not established with a provider, would they like to be referred to a provider to establish care? N/a    Dental Screening: Recommended annual dental exams for proper oral hygiene  Community Resource Referral / Chronic Care Management: CRR required this visit?  no  CCM required this visit?  no     Plan:     I have personally reviewed and noted the following in the patient's chart:   . Medical and social history . Use of alcohol, tobacco or illicit drugs  . Current medications and supplements . Functional ability and status . Nutritional status . Physical activity . Advanced directives . List of other physicians . Hospitalizations, surgeries, and ER visits in previous 12 months . Vitals . Screenings to include cognitive, depression, and falls . Referrals and appointments  In addition, I have reviewed and discussed with patient certain preventive protocols, quality metrics, and best practice recommendations. A written  personalized care plan for preventive services as well as general preventive health recommendations were provided to patient.     Gerlene Fee, NP   01/11/2021

## 2021-01-11 NOTE — Patient Instructions (Signed)
   [name] , Thank you for taking time to come for your Medicare Wellness Visit. I appreciate your ongoing commitment to your health goals. Please review the following plan we discussed and let me know if I can assist you in the future.   These are the goals we discussed: Goals    . Absence of Fall and Fall-Related Injury     Evidence-based guidance:   Assess fall risk using a validated tool when available. Consider balance and gait impairment, muscle weakness, diminished vision or hearing, environmental hazards, presence of urinary or bowel urgency and/or incontinence.   Communicate fall injury risk to interprofessional healthcare team.   Develop a fall prevention plan with the patient and family.   Promote use of personal vision and auditory aids.   Promote reorientation, appropriate sensory stimulation, and routines to decrease risk of fall when changes in mental status are present.   Assess assistance level required for safe and effective self-care; consider referral for home care.   Encourage physical activity, such as performance of self-care at highest level of ability, strength and balance exercise program, and provision of appropriate assistive devices; refer to rehabilitation therapy.   Refer to community-based fall prevention program where available.   If fall occurs, determine the cause and revise fall injury prevention plan.   Regularly review medication contribution to fall risk; consider risk related to polypharmacy and age.   Refer to pharmacist for consultation when concerns about medications are revealed.   Balance adequate pain management with potential for oversedation.   Provide guidance related to environmental modifications.   Consider supplementation with Vitamin D.   Notes:     . Follow up with Provider as scheduled    . General - Client will not be readmitted within 30 days (C-SNP)       This is a list of the screening recommended for you and  due dates:  Health Maintenance  Topic Date Due  . DEXA scan (bone density measurement)  Never done  . Tetanus Vaccine  02/14/2021  . Flu Shot  04/25/2021  . COVID-19 Vaccine  Completed  . Pneumonia vaccines  Completed  . HPV Vaccine  Aged Out

## 2021-01-18 DIAGNOSIS — B351 Tinea unguium: Secondary | ICD-10-CM | POA: Diagnosis not present

## 2021-01-18 DIAGNOSIS — I739 Peripheral vascular disease, unspecified: Secondary | ICD-10-CM | POA: Diagnosis not present

## 2021-01-18 DIAGNOSIS — L603 Nail dystrophy: Secondary | ICD-10-CM | POA: Diagnosis not present

## 2021-02-02 ENCOUNTER — Encounter: Payer: Self-pay | Admitting: Internal Medicine

## 2021-02-02 ENCOUNTER — Non-Acute Institutional Stay (SKILLED_NURSING_FACILITY): Payer: Medicare Other | Admitting: Internal Medicine

## 2021-02-02 DIAGNOSIS — D539 Nutritional anemia, unspecified: Secondary | ICD-10-CM | POA: Diagnosis not present

## 2021-02-02 DIAGNOSIS — E876 Hypokalemia: Secondary | ICD-10-CM

## 2021-02-02 DIAGNOSIS — I10 Essential (primary) hypertension: Secondary | ICD-10-CM

## 2021-02-02 DIAGNOSIS — E43 Unspecified severe protein-calorie malnutrition: Secondary | ICD-10-CM

## 2021-02-02 NOTE — Assessment & Plan Note (Signed)
On Hygroton ; consider change to Spironolactone

## 2021-02-02 NOTE — Progress Notes (Signed)
NURSING HOME LOCATION: [facility] ROOM NUMBER:  [id number]  CODE STATUS:  DNR  PCP:  Bard Herbert NP  This is a nursing facility follow up visit of chronic medical diagnoses & to document compliance with Regulation 483.30 (c) in The [place] Manual Phase 2 which mandates caregiver visit ( visits can alternate among physician, PA or NP as per statutes) within 10 days of 30 days / 60 days/ 90 days post admission to SNF date    Interim medical record and care since last SNF visit was updated with review of diagnostic studies and change in clinical status since last visit were documented.  HPI: She is a permanent resident of the facility with diagnoses of essential hypertension, history of asthma, vitamin D deficiency, B12 deficiency, extrinsic rhinoconjunctivitis, GERD, sleep apnea, history of breast cancer, and history of depression. Surgeries and procedures include abdominal hysterectomy, breast cancer surgery, thyroid surgery,colostomy, and multiple orthopedic surgeries.  Review of systems: Her only complaint is that "they will not let me walk, (they are) afraid I'll fall".  She is alert and oriented.  When I entered the room she was reading a Cyril Mourning book.  She was able to give me the correct date and name of the [place].  She was not current with events in [place] "because I do not watch TV news just shows".  She denies any active complaints.  Constitutional: No fever, significant weight change, fatigue  Eyes: No redness, discharge, pain, vision change ENT/mouth: No nasal congestion,  purulent discharge, earache, change in hearing, sore throat  Cardiovascular: No chest pain, palpitations, paroxysmal nocturnal dyspnea, claudication, edema  Respiratory: No cough, sputum production, hemoptysis, DOE, significant snoring, apnea   Gastrointestinal: No heartburn, dysphagia, abdominal pain, nausea /vomiting, rectal bleeding, melena, change in  bowels Genitourinary: No dysuria, hematuria, pyuria, incontinence, nocturia Musculoskeletal: No joint stiffness, joint swelling, weakness, pain Dermatologic: No rash, pruritus, change in appearance of skin Neurologic: No dizziness, headache, syncope, seizures, numbness, tingling Psychiatric: No significant anxiety, depression, insomnia, anorexia Endocrine: No change in hair/skin/nails, excessive thirst, excessive hunger, excessive urination  Hematologic/lymphatic: No significant bruising, lymphadenopathy, abnormal bleeding Allergy/immunology: No itchy/watery eyes, significant sneezing, urticaria, angioedema  Physical exam:  Pertinent or positive findings: She was up in the wheelchair in her room.  She is hard of hearing.  She has slight ptosis on the right.  Complete dentures are present.  Breath sounds are decreased. Central obesity present. Ostomy is present.  Pedal pulses are decreased.  She has 1/2+ edema at the sock line.  OA changes of the hands are present.  Strength to opposition is good in all extremities.  General appearance: Adequately nourished; no acute distress, increased work of breathing is present.   Lymphatic: No lymphadenopathy about the head, neck, axilla. Eyes: No conjunctival inflammation or lid edema is present. There is no scleral icterus. Ears:  External ear exam shows no significant lesions or deformities.   Nose:  External nasal examination shows no deformity or inflammation. Nasal mucosa are pink and moist without lesions, exudates Oral exam:  Lips and gums are healthy appearing. There is no oropharyngeal erythema or exudate. Neck:  No thyromegaly, masses, tenderness noted.    Heart:  Normal rate and regular rhythm. S1 and S2 normal without gallop, murmur, click, rub .  Lungs: without wheezes, rhonchi, rales, rubs. Abdomen: Bowel sounds are normal. Abdomen is soft and nontender with no organomegaly, hernias, masses. GU: Deferred  Extremities:  No cyanosis,  clubbing  Neurologic exam :Balance, [place], finger to nose testing could not be completed due to clinical state Skin: Warm & dry w/o tenting. No significant lesions or rash.  See summary under each active problem in the Problem List with associated updated therapeutic plan

## 2021-02-02 NOTE — Patient Instructions (Signed)
See assessment and plan under each diagnosis in the problem list and acutely for this visit 

## 2021-02-02 NOTE — Assessment & Plan Note (Addendum)
BP controlled; but consider changing [place] to Spironolactone due to hyperglycemia & hypokalemia

## 2021-02-02 NOTE — Assessment & Plan Note (Signed)
As noted anemia has resolved but macrocytosis persists. Check B12 level with next blood draw

## 2021-02-04 NOTE — Assessment & Plan Note (Signed)
01/06/2021 albumin 3.6 & total protein 6.9. Issue resolved.

## 2021-02-07 ENCOUNTER — Non-Acute Institutional Stay (SKILLED_NURSING_FACILITY): Payer: Medicare Other | Admitting: Adult Health

## 2021-02-07 ENCOUNTER — Encounter: Payer: Self-pay | Admitting: Adult Health

## 2021-02-07 DIAGNOSIS — F339 Major depressive disorder, recurrent, unspecified: Secondary | ICD-10-CM

## 2021-02-07 DIAGNOSIS — I700 Atherosclerosis of aorta: Secondary | ICD-10-CM | POA: Diagnosis not present

## 2021-02-07 DIAGNOSIS — Z933 Colostomy status: Secondary | ICD-10-CM | POA: Diagnosis not present

## 2021-02-07 DIAGNOSIS — E876 Hypokalemia: Secondary | ICD-10-CM

## 2021-02-07 DIAGNOSIS — I708 Atherosclerosis of other arteries: Secondary | ICD-10-CM

## 2021-02-07 NOTE — Progress Notes (Signed)
Location:  [facility] Room Number: 144-P Place of Service:  SNF (31)   CODE STATUS: DNR  No Known Allergies  Chief Complaint  Patient presents with  . Medical Management of Chronic Issues          Hypokalemia:    Major depression recurrent chronic:    Aortoiliac atherosclerosis    HPI:  She is a 87 year old long term resident of this facility being seen for the management of her chronic illnesses:  Hypokalemia:    Major depression recurrent chronic:    Aortoiliac atherosclerosis. There are no reports of pain; no reports of anxiety or depressive thoughts.   Past Medical History:  Diagnosis Date  . Anxiety   . Arthritis   . Asthma   . Cancer ([place])    breast  . Depression   . Diverticulosis   . GERD (gastroesophageal reflux disease)   . HTN (hypertension)   . Shortness of breath    exertion  . Sleep apnea    STOP BANG 4  . Thyroid dysfunction     Past Surgical History:  Procedure Laterality Date  . ABDOMINAL HYSTERECTOMY    . arthroscopic surgery rt knee  1997  . BREAST SURGERY  2001   LEFT/ CANCER   . broken radius ulna  1990   left  . broken right ankle  1976  . EXPLORATORY LAPAROTOMY  08/24/2020   with partial sigmoidectomy creat of end colostomy and application of negative pressure dressing.     Marland Kitchen JOINT REPLACEMENT  right knee 2005 [facility]  . left knee  2004  . rt foot bone spur removed  1992  . THYROID SURGERY  1983  . TOTAL KNEE ARTHROPLASTY  01/22/2012   Procedure: TOTAL KNEE ARTHROPLASTY;  Surgeon: Carole Civil, MD;  Location: [facility];  Service: Orthopedics;  Laterality: Left;  . TUBAL LIGATION      Social History   Socioeconomic History  . Marital status: Widowed    Spouse name: Not on file  . Number of children: Not on file  . Years of education: Not on file  . Highest education level: Not on file  Occupational History  . Not on file  Tobacco Use  . Smoking status: Former Research scientist (life sciences)  . Smokeless tobacco: Former Engineer, maintenance (IT)  . Vaping Use: Never used  Substance and Sexual Activity  . Alcohol use: No  . Drug use: No  . Sexual activity: Never  Other Topics Concern  . Not on file  Social History Narrative  . Not on file   Social Determinants of Health   Financial Resource Strain: Not on file  Food Insecurity: Not on file  Transportation Needs: Not on file  Physical Activity: Not on file  Stress: Not on file  Social Connections: Not on file  Intimate Partner Violence: Not on file   Family History  Problem Relation Age of Onset  . Pseudochol deficiency Neg Hx   . Malignant hyperthermia Neg Hx   . Hypotension Neg Hx   . Anesthesia problems Neg Hx       VITAL SIGNS BP 126/72   Pulse 74   Temp (!) 97.4 F (36.3 C)   Resp 20   Ht _0  (1.448 m)   Wt 178 lb 3.2 oz (80.8 kg)   SpO2 99%   BMI 38.56 kg/m   Outpatient Encounter Medications as of 02/07/2021  Medication Sig  . acetaminophen (TYLENOL) 500 MG tablet Take  500 mg by mouth every 8 (eight) hours as needed.  . Amino Acids-Protein Hydrolys (FEEDING SUPPLEMENT, PRO-STAT 64,) LIQD Take 30 mLs by mouth in the morning and at bedtime.  Marland Kitchen atenolol (TENORMIN) 50 MG tablet Take 50 mg by mouth every evening.  Marland Kitchen BREO ELLIPTA 200-25 MCG/INH AEPB Inhale 1 puff into the lungs daily.  . chlorthalidone (HYGROTON) 25 MG tablet Take 25 mg by mouth daily.  . cholecalciferol (VITAMIN D) 1000 UNITS tablet Take 2,000 Units by mouth every morning.  . Cyanocobalamin 1000 MCG/ML KIT Inject as directed. Once a day on Saturday from 09/04/2020-10/02/2020. Then take one injection once a month starting 11/04/2020  . DULoxetine (CYMBALTA) 60 MG capsule Take 60 mg by mouth daily.  . Ensure (ENSURE) Take 1 Can by mouth 2 (two) times daily between meals.  Marland Kitchen loratadine (CLARITIN) 10 MG tablet Take 10 mg by mouth daily.  . melatonin 3 MG TABS tablet Take 3 mg by mouth at bedtime.  . Multiple Vitamins-Minerals (CENTRUM PO) Take by mouth.  . Multiple  Vitamins-Minerals (PRESERVISION AREDS 2) CAPS Take 1 capsule by mouth in the morning and at bedtime.  . NON FORMULARY Diet: __x___ Regular, ______ NAS, _______Consistent Carbohydrate, _______NPO _____Other  . nystatin (MYCOSTATIN/NYSTOP) powder Apply 1 application topically daily as needed. Special Instructions: Apply thin layer of Nystatin powder to skin around stoma when changing colostomy bag PRN irritation.  . pantoprazole (PROTONIX) 40 MG tablet Take 40 mg by mouth 2 (two) times daily.  . potassium chloride SA (KLOR-CON) 20 MEQ tablet Take 40 mEq by mouth daily. 10 am give with food and full glass of liquid   No facility-administered encounter medications on file as of 02/07/2021.     SIGNIFICANT DIAGNOSTIC EXAMS  PREVIOUS   08-21-20; ct of abdomen and pelvis:  1. Distended and fluid filled obstructed large bowel with focal transition point in the sigmoid colon, raising concern for underlying stricture or mass in this region. No evidence of pneumatosis or perforation.  2. Gastric suction tube tip terminates in the duodenum consider slight retraction  08-21-20: ct of abdomen and pelvis 1. Gallbladder/biliary layering hyperdense material in the gallbladder may reflect vicarious excretion of contrast or sludge  2. Mesentery somewhat tortuous structure that matches blood pool adjacent to the proximal SMA favored to reflect a varicosity or vascular anastomosis 3. GI tract: colonic diverticulosis without evidence of diverticulitis  4. Vascular aortobilliac atherosclerosis  5. MSK; reverse s-shaped scoliotic curvature of the spine. L1 compression fracture status post cement augmentation  Osteopenia and poly articular degenerative changes.   NO NEW LABS.   LABS REVIEWED PREVIOUS  08-19-20 vit B12: 176 08-22-20: wbc 8.2; hgb 10.3; hct 31.4; mcv 101.4 plt 151 glucose 58; bun 10 creat 0.25; k+ 4.3; na++ 132 ca 7.4 mag 1.0 phos 1.2  08-23-20: glucose 100 bun 15; creat 0.50 k+ 4.3 na++ 133;  ca 8.8  mag 2.8  08-31-20: wbc 6.0; hgb 10.7; hct 32.1; mcv 102.0 plt 288; glucose 153; bun 6; creat 0.42; k+ 3.4; na++ 134  09-06-20: wbc 6.3; hgb 11.4 hct 36.2 mcv 104.9; plt 340; glucose 98 bun 10; creat 0.61; k+ 3.6; na++ 133; ca 9.2 liver normal albumin 2.6  TODAY  09-20-20: wbc 7.0; hgb 13.9; hct 43.1; mcv 103.1 plt 332 01-06-21: wb 6.6; hgb 14.5; hct 44.2; mcv 102.1 plt 257; glucose 117; bun 28; creat 0.63; k+ 3.1; na++ 138; ca 9.5 GFR>60; live normal albumin 3.6     Review of Systems  Unable to  perform ROS: Dementia (unable to fully participate )   Physical Exam Constitutional:      General: She is not in acute distress.    Appearance: She is well-developed. She is not diaphoretic.  Neck:     Thyroid: No thyromegaly.  Cardiovascular:     Rate and Rhythm: Normal rate and regular rhythm.     Pulses: Normal pulses.     Heart sounds: Normal heart sounds.  Pulmonary:     Effort: Pulmonary effort is normal. No respiratory distress.     Breath sounds: Normal breath sounds.  Abdominal:     General: Bowel sounds are normal. There is no distension.     Palpations: Abdomen is soft.     Tenderness: There is no abdominal tenderness.     Comments: Colostomy   Musculoskeletal:     Cervical back: Neck supple.     Right lower leg: No edema.     Left lower leg: No edema.     Comments: : Is able to move all extremities Has scoliosis    Lymphadenopathy:     Cervical: No cervical adenopathy.  Skin:    General: Skin is warm and dry.  Neurological:     Mental Status: She is alert. Mental status is at baseline.  Psychiatric:        Mood and Affect: Mood normal.    ASSESSMENT/ PLAN:  TODAY  1. Hypokalemia: is stable k+ 3.1 will continue k+ 40 meq daily   2. Major depression recurrent chronic: is stable will continue cymbalta 50 mg daily melatonin 3 mg nightly for sleep; is off ativan  3. Aortoiliac atherosclerosis: (ct 08-21-20) will monitor    PREVIOUS   4. Large bowel  obstruction/status post exploratory lap/status post colostomy: is stable will continue to monitor her status.   5. Degenerative disc disease lumbar is stable will continue lyrica 75 mg nightly; cymbalta 60 mg daily   6. Moderate persistent asthma in adult is stable will continue breo ellipta 200/25 mcg 1 puff daily   7. Non-seasonal allergic rhinitis unspecified trigger: is stable will continue zyrtec 10 mg daily flonase daily  8. GERD without esophagitis: is stable will continue protonix 40 mg daily   9. Essential hypertension: is stable b/p 112/65 will continue tenormin 50 mg daily and hygroton 25 mg daily     Ok Edwards NP [facility]  Contact [phone] Monday through Friday 8am- 5pm  After hours call [phone]

## 2021-02-09 ENCOUNTER — Other Ambulatory Visit (HOSPITAL_COMMUNITY): Payer: Medicare Other

## 2021-02-10 ENCOUNTER — Other Ambulatory Visit (HOSPITAL_COMMUNITY)
Admission: RE | Admit: 2021-02-10 | Discharge: 2021-02-10 | Disposition: A | Discharge status: Home / Self Care | Payer: Medicare Other | Source: Skilled Nursing Facility | Attending: Family Medicine | Admitting: Family Medicine

## 2021-02-10 ENCOUNTER — Encounter: Payer: Self-pay | Admitting: Adult Health

## 2021-02-10 ENCOUNTER — Non-Acute Institutional Stay (SKILLED_NURSING_FACILITY): Payer: Medicare Other | Admitting: Adult Health

## 2021-02-10 DIAGNOSIS — E876 Hypokalemia: Secondary | ICD-10-CM

## 2021-02-10 DIAGNOSIS — I10 Essential (primary) hypertension: Secondary | ICD-10-CM | POA: Insufficient documentation

## 2021-02-10 LAB — BASIC METABOLIC PANEL
Anion gap: 9 (ref 5–15)
BUN: 27 mg/dL — ABNORMAL HIGH (ref 8–23)
CO2: 29 mmol/L (ref 22–32)
Calcium: 9.2 mg/dL (ref 8.9–10.3)
Chloride: 100 mmol/L (ref 98–111)
Creatinine, Ser: 0.66 mg/dL (ref 0.44–1.00)
GFR, Estimated: >60 mL/min (ref >60)
Glucose, Bld: 89 mg/dL (ref 70–99)
Potassium: 2.8 mmol/L — ABNORMAL LOW (ref 3.5–5.1)
Sodium: 138 mmol/L (ref 135–145)

## 2021-02-10 NOTE — Progress Notes (Signed)
Location:  [facility] Room Number: 144-P Place of Service:  SNF (31)   CODE STATUS: DNR  No Known Allergies  Chief Complaint  Patient presents with  . Acute Visit    Lab follow-up    HPI:  Her potassium lab is 2.8. there are no reports of pain; no reports of anxiety or nervousness. She is presently taking k+ 40 meq daily   Past Medical History:  Diagnosis Date  . Anxiety   . Arthritis   . Asthma   . Cancer ([place])    breast  . Depression   . Diverticulosis   . GERD (gastroesophageal reflux disease)   . HTN (hypertension)   . Shortness of breath    exertion  . Sleep apnea    STOP BANG 4  . Thyroid dysfunction     Past Surgical History:  Procedure Laterality Date  . ABDOMINAL HYSTERECTOMY    . arthroscopic surgery rt knee  1997  . BREAST SURGERY  2001   LEFT/ CANCER   . broken radius ulna  1990   left  . broken right ankle  1976  . EXPLORATORY LAPAROTOMY  08/24/2020   with partial sigmoidectomy creat of end colostomy and application of negative pressure dressing.     Marland Kitchen JOINT REPLACEMENT  right knee 2005 [facility]  . left knee  2004  . rt foot bone spur removed  1992  . THYROID SURGERY  1983  . TOTAL KNEE ARTHROPLASTY  01/22/2012   Procedure: TOTAL KNEE ARTHROPLASTY;  Surgeon: Carole Civil, MD;  Location: [facility];  Service: Orthopedics;  Laterality: Left;  . TUBAL LIGATION      Social History   Socioeconomic History  . Marital status: Widowed    Spouse name: Not on file  . Number of children: Not on file  . Years of education: Not on file  . Highest education level: Not on file  Occupational History  . Not on file  Tobacco Use  . Smoking status: Former Research scientist (life sciences)  . Smokeless tobacco: Former Network engineer  . Vaping Use: Never used  Substance and Sexual Activity  . Alcohol use: No  . Drug use: No  . Sexual activity: Never  Other Topics Concern  . Not on file  Social History Narrative  . Not on file   Social  Determinants of Health   Financial Resource Strain: Not on file  Food Insecurity: Not on file  Transportation Needs: Not on file  Physical Activity: Not on file  Stress: Not on file  Social Connections: Not on file  Intimate Partner Violence: Not on file   Family History  Problem Relation Age of Onset  . Pseudochol deficiency Neg Hx   . Malignant hyperthermia Neg Hx   . Hypotension Neg Hx   . Anesthesia problems Neg Hx       VITAL SIGNS BP 126/72   Pulse 74   Temp 97.7 F (36.5 C)   Resp 20   Ht _0  (1.448 m)   Wt 178 lb 3.2 oz (80.8 kg)   SpO2 99%   BMI 38.56 kg/m   Outpatient Encounter Medications as of 02/10/2021  Medication Sig  . acetaminophen (TYLENOL) 500 MG tablet Take 500 mg by mouth every 8 (eight) hours as needed.  . Amino Acids-Protein Hydrolys (FEEDING SUPPLEMENT, PRO-STAT 64,) LIQD Take 30 mLs by mouth in the morning and at bedtime.  Marland Kitchen atenolol (TENORMIN) 50 MG tablet Take 50 mg by mouth every  evening.  Marland Kitchen BREO ELLIPTA 200-25 MCG/INH AEPB Inhale 1 puff into the lungs daily.  . chlorthalidone (HYGROTON) 25 MG tablet Take 25 mg by mouth daily.  . cholecalciferol (VITAMIN D) 1000 UNITS tablet Take 2,000 Units by mouth every morning.  . Cyanocobalamin 1000 MCG/ML KIT Inject as directed. Once a day on Saturday from 09/04/2020-10/02/2020. Then take one injection once a month starting 11/04/2020  . DULoxetine (CYMBALTA) 60 MG capsule Take 60 mg by mouth daily.  . Ensure (ENSURE) Take 1 Can by mouth 2 (two) times daily between meals.  Marland Kitchen loratadine (CLARITIN) 10 MG tablet Take 10 mg by mouth daily.  . melatonin 3 MG TABS tablet Take 3 mg by mouth at bedtime.  . Multiple Vitamins-Minerals (CENTRUM PO) Take by mouth.  . Multiple Vitamins-Minerals (PRESERVISION AREDS 2) CAPS Take 1 capsule by mouth in the morning and at bedtime.  . NON FORMULARY Diet: __x___ Regular, ______ NAS, _______Consistent Carbohydrate, _______NPO _____Other  . nystatin (MYCOSTATIN/NYSTOP)  powder Apply 1 application topically daily as needed. Special Instructions: Apply thin layer of Nystatin powder to skin around stoma when changing colostomy bag PRN irritation.  . pantoprazole (PROTONIX) 40 MG tablet Take 40 mg by mouth 2 (two) times daily.  . [DISCONTINUED] potassium chloride SA (KLOR-CON) 20 MEQ tablet Take 40 mEq by mouth daily. 10 am give with food and full glass of liquid (Patient not taking: Reported on 02/10/2021)   No facility-administered encounter medications on file as of 02/10/2021.     SIGNIFICANT DIAGNOSTIC EXAMS  PREVIOUS   08-21-20; ct of abdomen and pelvis:  1. Distended and fluid filled obstructed large bowel with focal transition point in the sigmoid colon, raising concern for underlying stricture or mass in this region. No evidence of pneumatosis or perforation.  2. Gastric suction tube tip terminates in the duodenum consider slight retraction  08-21-20: ct of abdomen and pelvis 1. Gallbladder/biliary layering hyperdense material in the gallbladder may reflect vicarious excretion of contrast or sludge  2. Mesentery somewhat tortuous structure that matches blood pool adjacent to the proximal SMA favored to reflect a varicosity or vascular anastomosis 3. GI tract: colonic diverticulosis without evidence of diverticulitis  4. Vascular aortobilliac atherosclerosis  5. MSK; reverse s-shaped scoliotic curvature of the spine. L1 compression fracture status post cement augmentation  Osteopenia and poly articular degenerative changes.   NO NEW LABS.   LABS REVIEWED PREVIOUS  08-19-20 vit B12: 176 08-22-20: wbc 8.2; hgb 10.3; hct 31.4; mcv 101.4 plt 151 glucose 58; bun 10 creat 0.25; k+ 4.3; na++ 132 ca 7.4 mag 1.0 phos 1.2  08-23-20: glucose 100 bun 15; creat 0.50 k+ 4.3 na++ 133; ca 8.8  mag 2.8  08-31-20: wbc 6.0; hgb 10.7; hct 32.1; mcv 102.0 plt 288; glucose 153; bun 6; creat 0.42; k+ 3.4; na++ 134  09-06-20: wbc 6.3; hgb 11.4 hct 36.2 mcv 104.9; plt 340;  glucose 98 bun 10; creat 0.61; k+ 3.6; na++ 133; ca 9.2 liver normal albumin 2.6 09-20-20: wbc 7.0; hgb 13.9; hct 43.1; mcv 103.1 plt 332 01-06-21: wb 6.6; hgb 14.5; hct 44.2; mcv 102.1 plt 257; glucose 117; bun 28; creat 0.63; k+ 3.1; na++ 138; ca 9.5 GFR>60; live normal albumin 3.6  TODAY  02-10-21: glucose 89; bun 27; creat 0.66; k+ 2.8; na++ 138; ca 9.2 GFR>60  Review of Systems  Unable to perform ROS: Dementia (is unable to participate )       Physical Exam Constitutional:      General: She is not  in acute distress.    Appearance: She is well-developed. She is not diaphoretic.  Neck:     Thyroid: No thyromegaly.  Cardiovascular:     Rate and Rhythm: Normal rate and regular rhythm.     Heart sounds: Normal heart sounds.  Pulmonary:     Effort: Pulmonary effort is normal. No respiratory distress.     Breath sounds: Normal breath sounds.  Abdominal:     General: Bowel sounds are normal. There is no distension.     Palpations: Abdomen is soft.     Tenderness: There is no abdominal tenderness.     Comments: Colostomy   Musculoskeletal:     Cervical back: Neck supple.     Right lower leg: No edema.     Left lower leg: No edema.     Comments: Is able to move all extremities Has scoliosis    Lymphadenopathy:     Cervical: No cervical adenopathy.  Skin:    General: Skin is warm and dry.  Neurological:     Mental Status: She is alert. Mental status is at baseline.  Psychiatric:        Mood and Affect: Mood normal.      ASSESSMENT/ PLAN:  TODAY  1. Hypokalemia: is worse at 2.8 will give k+ 40 meq three times today then increase to 40 meq twice daily will repeat k+ level in the AM.    Ok Edwards NP [facility]  Contact [phone] Monday through Friday 8am- 5pm  After hours call [phone]

## 2021-02-11 ENCOUNTER — Encounter (HOSPITAL_COMMUNITY)
Admission: RE | Admit: 2021-02-11 | Discharge: 2021-02-11 | Disposition: A | Discharge status: Home / Self Care | Payer: Medicare Other | Source: Skilled Nursing Facility | Attending: Adult Health | Admitting: Adult Health

## 2021-02-11 DIAGNOSIS — E876 Hypokalemia: Secondary | ICD-10-CM | POA: Diagnosis not present

## 2021-02-11 LAB — POTASSIUM: Potassium: 4.6 mmol/L (ref 3.5–5.1)

## 2021-03-01 ENCOUNTER — Encounter: Payer: Self-pay | Admitting: Adult Health

## 2021-03-01 ENCOUNTER — Non-Acute Institutional Stay (SKILLED_NURSING_FACILITY): Payer: Medicare Other | Admitting: Adult Health

## 2021-03-01 DIAGNOSIS — K56699 Other intestinal obstruction unspecified as to partial versus complete obstruction: Secondary | ICD-10-CM | POA: Diagnosis not present

## 2021-03-01 DIAGNOSIS — Z933 Colostomy status: Secondary | ICD-10-CM | POA: Diagnosis not present

## 2021-03-01 DIAGNOSIS — K56609 Unspecified intestinal obstruction, unspecified as to partial versus complete obstruction: Secondary | ICD-10-CM

## 2021-03-01 DIAGNOSIS — J4540 Moderate persistent asthma, uncomplicated: Secondary | ICD-10-CM

## 2021-03-01 DIAGNOSIS — M6281 Muscle weakness (generalized): Secondary | ICD-10-CM | POA: Diagnosis not present

## 2021-03-01 DIAGNOSIS — Z23 Encounter for immunization: Secondary | ICD-10-CM | POA: Diagnosis not present

## 2021-03-01 DIAGNOSIS — M5137 Other intervertebral disc degeneration, lumbosacral region: Secondary | ICD-10-CM

## 2021-03-01 DIAGNOSIS — R262 Difficulty in walking, not elsewhere classified: Secondary | ICD-10-CM | POA: Diagnosis not present

## 2021-03-01 NOTE — Progress Notes (Signed)
Location:  [facility] Room Number: 144-P Place of Service:  SNF (31)   CODE STATUS: DNR  No Known Allergies  Chief Complaint  Patient presents with  . Medical Management of Chronic Issues       Large bowel obstruction/status post exploratory lap/status post colostomy . Degenerative disc disease lumbar:   Moderate persistent asthma in adult:    HPI:  She is a 87 year old long term resident of this facility being seen for the management of her chronic illnesses: Large bowel obstruction/status post exploratory lap/status post colostomy . Degenerative disc disease lumbar:   Moderate persistent asthma in adul. There are no reports of uncontrolled pain. There are no reports of hagnes in appetite; weight is stable. No reports of cough or shortness of breath.   Past Medical History:  Diagnosis Date  . Anxiety   . Arthritis   . Asthma   . Cancer ([place])    breast  . Depression   . Diverticulosis   . GERD (gastroesophageal reflux disease)   . HTN (hypertension)   . Shortness of breath    exertion  . Sleep apnea    STOP BANG 4  . Thyroid dysfunction     Past Surgical History:  Procedure Laterality Date  . ABDOMINAL HYSTERECTOMY    . arthroscopic surgery rt knee  1997  . BREAST SURGERY  2001   LEFT/ CANCER   . broken radius ulna  1990   left  . broken right ankle  1976  . EXPLORATORY LAPAROTOMY  08/24/2020   with partial sigmoidectomy creat of end colostomy and application of negative pressure dressing.     Marland Kitchen JOINT REPLACEMENT  right knee 2005 [place]  . left knee  2004  . rt foot bone spur removed  1992  . THYROID SURGERY  1983  . TOTAL KNEE ARTHROPLASTY  01/22/2012   Procedure: TOTAL KNEE ARTHROPLASTY;  Surgeon: Carole Civil, MD;  Location: [facility];  Service: Orthopedics;  Laterality: Left;  . TUBAL LIGATION      Social History   Socioeconomic History  . Marital status: Widowed    Spouse name: Not on file  . Number of children: Not on  file  . Years of education: Not on file  . Highest education level: Not on file  Occupational History  . Not on file  Tobacco Use  . Smoking status: Former Research scientist (life sciences)  . Smokeless tobacco: Former Network engineer  . Vaping Use: Never used  Substance and Sexual Activity  . Alcohol use: No  . Drug use: No  . Sexual activity: Never  Other Topics Concern  . Not on file  Social History Narrative  . Not on file   Social Determinants of Health   Financial Resource Strain: Not on file  Food Insecurity: Not on file  Transportation Needs: Not on file  Physical Activity: Not on file  Stress: Not on file  Social Connections: Not on file  Intimate Partner Violence: Not on file   Family History  Problem Relation Age of Onset  . Pseudochol deficiency Neg Hx   . Malignant hyperthermia Neg Hx   . Hypotension Neg Hx   . Anesthesia problems Neg Hx       VITAL SIGNS BP (!) 147/69   Pulse 95   Temp 97.8 F (36.6 C)   Ht '4\' 9"'  (1.448 m)   Wt 179 lb 3.2 oz (81.3 kg)   SpO2 99%   BMI 38.78 kg/m  Outpatient Encounter Medications as of 03/01/2021  Medication Sig  . acetaminophen (TYLENOL) 500 MG tablet Take 500 mg by mouth every 8 (eight) hours as needed.  . Amino Acids-Protein Hydrolys (FEEDING SUPPLEMENT, PRO-STAT 64,) LIQD Take 30 mLs by mouth in the morning and at bedtime.  Marland Kitchen atenolol (TENORMIN) 50 MG tablet Take 50 mg by mouth every evening.  Marland Kitchen BREO ELLIPTA 200-25 MCG/INH AEPB Inhale 1 puff into the lungs daily.  . chlorthalidone (HYGROTON) 25 MG tablet Take 25 mg by mouth daily.  . cholecalciferol (VITAMIN D) 1000 UNITS tablet Take 2,000 Units by mouth every morning.  . Cyanocobalamin 1000 MCG/ML KIT Inject as directed. Once a day on Saturday from 09/04/2020-10/02/2020. Then take one injection once a month starting 11/04/2020  . DULoxetine (CYMBALTA) 60 MG capsule Take 60 mg by mouth daily.  . Ensure (ENSURE) Take 1 Can by mouth 2 (two) times daily between meals.  Marland Kitchen loratadine  (CLARITIN) 10 MG tablet Take 10 mg by mouth daily.  . melatonin 3 MG TABS tablet Take 3 mg by mouth at bedtime.  . Multiple Vitamins-Minerals (CENTRUM PO) Take by mouth.  . Multiple Vitamins-Minerals (PRESERVISION AREDS 2) CAPS Take 1 capsule by mouth in the morning and at bedtime.  . NON FORMULARY Diet: __x___ Regular, ______ NAS, _______Consistent Carbohydrate, _______NPO _____Other  . nystatin (MYCOSTATIN/NYSTOP) powder Apply 1 application topically daily as needed. Special Instructions: Apply thin layer of Nystatin powder to skin around stoma when changing colostomy bag PRN irritation.  . pantoprazole (PROTONIX) 40 MG tablet Take 40 mg by mouth 2 (two) times daily.  . potassium chloride SA (KLOR-CON) 20 MEQ tablet Take 40 mEq by mouth 2 (two) times daily.   No facility-administered encounter medications on file as of 03/01/2021.     SIGNIFICANT DIAGNOSTIC EXAMS   PREVIOUS   08-21-20; ct of abdomen and pelvis:  1. Distended and fluid filled obstructed large bowel with focal transition point in the sigmoid colon, raising concern for underlying stricture or mass in this region. No evidence of pneumatosis or perforation.  2. Gastric suction tube tip terminates in the duodenum consider slight retraction  08-21-20: ct of abdomen and pelvis 1. Gallbladder/biliary layering hyperdense material in the gallbladder may reflect vicarious excretion of contrast or sludge  2. Mesentery somewhat tortuous structure that matches blood pool adjacent to the proximal SMA favored to reflect a varicosity or vascular anastomosis 3. GI tract: colonic diverticulosis without evidence of diverticulitis  4. Vascular aortobilliac atherosclerosis  5. MSK; reverse s-shaped scoliotic curvature of the spine. L1 compression fracture status post cement augmentation  Osteopenia and poly articular degenerative changes.   NO NEW LABS.   LABS REVIEWED PREVIOUS  08-19-20 vit B12: 176 08-22-20: wbc 8.2; hgb 10.3; hct  31.4; mcv 101.4 plt 151 glucose 58; bun 10 creat 0.25; k+ 4.3; na++ 132 ca 7.4 mag 1.0 phos 1.2  08-23-20: glucose 100 bun 15; creat 0.50 k+ 4.3 na++ 133; ca 8.8  mag 2.8  08-31-20: wbc 6.0; hgb 10.7; hct 32.1; mcv 102.0 plt 288; glucose 153; bun 6; creat 0.42; k+ 3.4; na++ 134  09-06-20: wbc 6.3; hgb 11.4 hct 36.2 mcv 104.9; plt 340; glucose 98 bun 10; creat 0.61; k+ 3.6; na++ 133; ca 9.2 liver normal albumin 2.6 09-20-20: wbc 7.0; hgb 13.9; hct 43.1; mcv 103.1 plt 332 01-06-21: wb 6.6; hgb 14.5; hct 44.2; mcv 102.1 plt 257; glucose 117; bun 28; creat 0.63; k+ 3.1; na++ 138; ca 9.5 GFR>60; live normal albumin 3.6 02-10-21: glucose  89; bun 27; creat 0.66; k+ 2.8; na++ 138; ca 9.2 GFR>60 02-11-21: k+ 4.6   NO NEW LABS.   Review of Systems  Unable to perform ROS: Dementia (unable to participate)   Physical Exam Constitutional:      General: She is not in acute distress.    Appearance: She is well-developed. She is not diaphoretic.  Neck:     Thyroid: No thyromegaly.  Cardiovascular:     Rate and Rhythm: Normal rate and regular rhythm.     Heart sounds: Normal heart sounds.  Pulmonary:     Effort: Pulmonary effort is normal. No respiratory distress.     Breath sounds: Normal breath sounds.  Abdominal:     General: Bowel sounds are normal. There is no distension.     Palpations: Abdomen is soft.     Tenderness: There is no abdominal tenderness.     Comments: Colostomy   Musculoskeletal:        General: Normal range of motion.     Cervical back: Neck supple.     Right lower leg: No edema.     Left lower leg: No edema.     Comments: Is able to move all extremities Scoliosis   Lymphadenopathy:     Cervical: No cervical adenopathy.  Skin:    General: Skin is warm and dry.  Neurological:     Mental Status: She is alert. Mental status is at baseline.  Psychiatric:        Mood and Affect: Mood normal.     ASSESSMENT/ PLAN:  TODAY  Large bowel obstruction/status post exploratory  lap/status post colostomy will continue to monitor   2. Degenerative disc disease lumbar: is stable will continue lyrica 75 mg nightly cymbalta 60 mg daily   3. Moderate persistent asthma in adult: is stable will continue breo ellipta 200/25 mcg 1 puff daily    PREVIOUS   4. Non-seasonal allergic rhinitis unspecified trigger: is stable will continue zyrtec 10 mg daily flonase daily  5. GERD without esophagitis: is stable will continue protonix 40 mg daily   6. Essential hypertension: is stable b/p 147/69 will continue tenormin 50 mg daily and hygroton 25 mg daily   7. Hypokalemia: is stable k+ 4.6 will continue k+ 40 meq twice daily   8. Major depression recurrent chronic: is stable will continue cymbalta 50 mg daily melatonin 3 mg nightly for sleep; is off ativan  9. Aortoiliac atherosclerosis: (ct 08-21-20) will monitor   Ok Edwards NP [facility]  Contact [phone] Monday through Friday 8am- 5pm  After hours call [phone]

## 2021-03-02 DIAGNOSIS — R262 Difficulty in walking, not elsewhere classified: Secondary | ICD-10-CM | POA: Diagnosis not present

## 2021-03-02 DIAGNOSIS — K56699 Other intestinal obstruction unspecified as to partial versus complete obstruction: Secondary | ICD-10-CM | POA: Diagnosis not present

## 2021-03-02 DIAGNOSIS — M5137 Other intervertebral disc degeneration, lumbosacral region: Secondary | ICD-10-CM | POA: Diagnosis not present

## 2021-03-02 DIAGNOSIS — M6281 Muscle weakness (generalized): Secondary | ICD-10-CM | POA: Diagnosis not present

## 2021-03-02 DIAGNOSIS — M85841 Other specified disorders of bone density and structure, right hand: Secondary | ICD-10-CM | POA: Diagnosis not present

## 2021-03-03 DIAGNOSIS — M5137 Other intervertebral disc degeneration, lumbosacral region: Secondary | ICD-10-CM | POA: Diagnosis not present

## 2021-03-03 DIAGNOSIS — K56699 Other intestinal obstruction unspecified as to partial versus complete obstruction: Secondary | ICD-10-CM | POA: Diagnosis not present

## 2021-03-03 DIAGNOSIS — M6281 Muscle weakness (generalized): Secondary | ICD-10-CM | POA: Diagnosis not present

## 2021-03-03 DIAGNOSIS — R262 Difficulty in walking, not elsewhere classified: Secondary | ICD-10-CM | POA: Diagnosis not present

## 2021-03-04 DIAGNOSIS — M5137 Other intervertebral disc degeneration, lumbosacral region: Secondary | ICD-10-CM | POA: Diagnosis not present

## 2021-03-04 DIAGNOSIS — K56699 Other intestinal obstruction unspecified as to partial versus complete obstruction: Secondary | ICD-10-CM | POA: Diagnosis not present

## 2021-03-04 DIAGNOSIS — M6281 Muscle weakness (generalized): Secondary | ICD-10-CM | POA: Diagnosis not present

## 2021-03-04 DIAGNOSIS — R262 Difficulty in walking, not elsewhere classified: Secondary | ICD-10-CM | POA: Diagnosis not present

## 2021-03-05 DIAGNOSIS — M5137 Other intervertebral disc degeneration, lumbosacral region: Secondary | ICD-10-CM | POA: Diagnosis not present

## 2021-03-05 DIAGNOSIS — M6281 Muscle weakness (generalized): Secondary | ICD-10-CM | POA: Diagnosis not present

## 2021-03-05 DIAGNOSIS — K56699 Other intestinal obstruction unspecified as to partial versus complete obstruction: Secondary | ICD-10-CM | POA: Diagnosis not present

## 2021-03-05 DIAGNOSIS — R262 Difficulty in walking, not elsewhere classified: Secondary | ICD-10-CM | POA: Diagnosis not present

## 2021-03-07 DIAGNOSIS — M5137 Other intervertebral disc degeneration, lumbosacral region: Secondary | ICD-10-CM | POA: Diagnosis not present

## 2021-03-07 DIAGNOSIS — R262 Difficulty in walking, not elsewhere classified: Secondary | ICD-10-CM | POA: Diagnosis not present

## 2021-03-07 DIAGNOSIS — K56699 Other intestinal obstruction unspecified as to partial versus complete obstruction: Secondary | ICD-10-CM | POA: Diagnosis not present

## 2021-03-07 DIAGNOSIS — M6281 Muscle weakness (generalized): Secondary | ICD-10-CM | POA: Diagnosis not present

## 2021-03-09 DIAGNOSIS — K56699 Other intestinal obstruction unspecified as to partial versus complete obstruction: Secondary | ICD-10-CM | POA: Diagnosis not present

## 2021-03-09 DIAGNOSIS — M5137 Other intervertebral disc degeneration, lumbosacral region: Secondary | ICD-10-CM | POA: Diagnosis not present

## 2021-03-09 DIAGNOSIS — R262 Difficulty in walking, not elsewhere classified: Secondary | ICD-10-CM | POA: Diagnosis not present

## 2021-03-09 DIAGNOSIS — M6281 Muscle weakness (generalized): Secondary | ICD-10-CM | POA: Diagnosis not present

## 2021-03-10 DIAGNOSIS — M5137 Other intervertebral disc degeneration, lumbosacral region: Secondary | ICD-10-CM | POA: Diagnosis not present

## 2021-03-10 DIAGNOSIS — K56699 Other intestinal obstruction unspecified as to partial versus complete obstruction: Secondary | ICD-10-CM | POA: Diagnosis not present

## 2021-03-10 DIAGNOSIS — R262 Difficulty in walking, not elsewhere classified: Secondary | ICD-10-CM | POA: Diagnosis not present

## 2021-03-10 DIAGNOSIS — M6281 Muscle weakness (generalized): Secondary | ICD-10-CM | POA: Diagnosis not present

## 2021-03-11 DIAGNOSIS — M5137 Other intervertebral disc degeneration, lumbosacral region: Secondary | ICD-10-CM | POA: Diagnosis not present

## 2021-03-11 DIAGNOSIS — R262 Difficulty in walking, not elsewhere classified: Secondary | ICD-10-CM | POA: Diagnosis not present

## 2021-03-11 DIAGNOSIS — M6281 Muscle weakness (generalized): Secondary | ICD-10-CM | POA: Diagnosis not present

## 2021-03-11 DIAGNOSIS — K56699 Other intestinal obstruction unspecified as to partial versus complete obstruction: Secondary | ICD-10-CM | POA: Diagnosis not present

## 2021-03-14 DIAGNOSIS — K56699 Other intestinal obstruction unspecified as to partial versus complete obstruction: Secondary | ICD-10-CM | POA: Diagnosis not present

## 2021-03-14 DIAGNOSIS — M6281 Muscle weakness (generalized): Secondary | ICD-10-CM | POA: Diagnosis not present

## 2021-03-14 DIAGNOSIS — R262 Difficulty in walking, not elsewhere classified: Secondary | ICD-10-CM | POA: Diagnosis not present

## 2021-03-14 DIAGNOSIS — M5137 Other intervertebral disc degeneration, lumbosacral region: Secondary | ICD-10-CM | POA: Diagnosis not present

## 2021-03-15 DIAGNOSIS — M5137 Other intervertebral disc degeneration, lumbosacral region: Secondary | ICD-10-CM | POA: Diagnosis not present

## 2021-03-15 DIAGNOSIS — K56699 Other intestinal obstruction unspecified as to partial versus complete obstruction: Secondary | ICD-10-CM | POA: Diagnosis not present

## 2021-03-15 DIAGNOSIS — M6281 Muscle weakness (generalized): Secondary | ICD-10-CM | POA: Diagnosis not present

## 2021-03-15 DIAGNOSIS — R262 Difficulty in walking, not elsewhere classified: Secondary | ICD-10-CM | POA: Diagnosis not present

## 2021-03-16 DIAGNOSIS — M6281 Muscle weakness (generalized): Secondary | ICD-10-CM | POA: Diagnosis not present

## 2021-03-16 DIAGNOSIS — M5137 Other intervertebral disc degeneration, lumbosacral region: Secondary | ICD-10-CM | POA: Diagnosis not present

## 2021-03-16 DIAGNOSIS — R262 Difficulty in walking, not elsewhere classified: Secondary | ICD-10-CM | POA: Diagnosis not present

## 2021-03-16 DIAGNOSIS — K56699 Other intestinal obstruction unspecified as to partial versus complete obstruction: Secondary | ICD-10-CM | POA: Diagnosis not present

## 2021-03-17 DIAGNOSIS — K56699 Other intestinal obstruction unspecified as to partial versus complete obstruction: Secondary | ICD-10-CM | POA: Diagnosis not present

## 2021-03-17 DIAGNOSIS — M5137 Other intervertebral disc degeneration, lumbosacral region: Secondary | ICD-10-CM | POA: Diagnosis not present

## 2021-03-17 DIAGNOSIS — M6281 Muscle weakness (generalized): Secondary | ICD-10-CM | POA: Diagnosis not present

## 2021-03-17 DIAGNOSIS — R262 Difficulty in walking, not elsewhere classified: Secondary | ICD-10-CM | POA: Diagnosis not present

## 2021-03-23 DIAGNOSIS — Z1159 Encounter for screening for other viral diseases: Secondary | ICD-10-CM | POA: Diagnosis not present

## 2021-03-23 DIAGNOSIS — K56699 Other intestinal obstruction unspecified as to partial versus complete obstruction: Secondary | ICD-10-CM | POA: Diagnosis not present

## 2021-03-29 DIAGNOSIS — Z1159 Encounter for screening for other viral diseases: Secondary | ICD-10-CM | POA: Diagnosis not present

## 2021-03-29 DIAGNOSIS — K56699 Other intestinal obstruction unspecified as to partial versus complete obstruction: Secondary | ICD-10-CM | POA: Diagnosis not present

## 2021-03-31 DIAGNOSIS — Z1159 Encounter for screening for other viral diseases: Secondary | ICD-10-CM | POA: Diagnosis not present

## 2021-03-31 DIAGNOSIS — K56699 Other intestinal obstruction unspecified as to partial versus complete obstruction: Secondary | ICD-10-CM | POA: Diagnosis not present

## 2021-04-05 DIAGNOSIS — Z1159 Encounter for screening for other viral diseases: Secondary | ICD-10-CM | POA: Diagnosis not present

## 2021-04-05 DIAGNOSIS — K56699 Other intestinal obstruction unspecified as to partial versus complete obstruction: Secondary | ICD-10-CM | POA: Diagnosis not present

## 2021-04-06 ENCOUNTER — Encounter (HOSPITAL_COMMUNITY)
Admission: AD | Admit: 2021-04-06 | Discharge: 2021-04-06 | Disposition: A | Discharge status: Home / Self Care | Payer: Medicare Other | Source: Skilled Nursing Facility | Attending: Adult Health | Admitting: Adult Health

## 2021-04-06 DIAGNOSIS — U071 COVID-19: Secondary | ICD-10-CM | POA: Insufficient documentation

## 2021-04-06 LAB — CBC
HCT: 42.2 % (ref 36.0–46.0)
Hemoglobin: 13.9 g/dL (ref 12.0–15.0)
MCH: 34.4 pg — ABNORMAL HIGH (ref 26.0–34.0)
MCHC: 32.9 g/dL (ref 30.0–36.0)
MCV: 104.5 fL — ABNORMAL HIGH (ref 80.0–100.0)
Platelets: 228 10*3/uL (ref 150–400)
RBC: 4.04 MIL/uL (ref 3.87–5.11)
RDW: 12 % (ref 11.5–15.5)
WBC: 6.8 10*3/uL (ref 4.0–10.5)
nRBC: 0 % (ref 0.0–0.2)

## 2021-04-06 LAB — D-DIMER, QUANTITATIVE: D-Dimer, Quant: 0.55 ug/mL-FEU — ABNORMAL HIGH (ref 0.00–0.50)

## 2021-04-06 LAB — C-REACTIVE PROTEIN: CRP: 2.3 mg/dL — ABNORMAL HIGH (ref <1.0)

## 2021-04-07 ENCOUNTER — Non-Acute Institutional Stay (SKILLED_NURSING_FACILITY): Payer: Medicare Other | Admitting: Adult Health

## 2021-04-07 ENCOUNTER — Other Ambulatory Visit (HOSPITAL_COMMUNITY)
Admission: RE | Admit: 2021-04-07 | Discharge: 2021-04-07 | Disposition: A | Discharge status: Home / Self Care | Payer: Medicare Other | Source: Skilled Nursing Facility | Attending: Adult Health | Admitting: Adult Health

## 2021-04-07 DIAGNOSIS — U071 COVID-19: Secondary | ICD-10-CM

## 2021-04-07 DIAGNOSIS — R0989 Other specified symptoms and signs involving the circulatory and respiratory systems: Secondary | ICD-10-CM | POA: Diagnosis not present

## 2021-04-07 LAB — BASIC METABOLIC PANEL
Anion gap: 7 (ref 5–15)
BUN: 25 mg/dL — ABNORMAL HIGH (ref 8–23)
CO2: 31 mmol/L (ref 22–32)
Calcium: 9.1 mg/dL (ref 8.9–10.3)
Chloride: 99 mmol/L (ref 98–111)
Creatinine, Ser: 0.7 mg/dL (ref 0.44–1.00)
GFR, Estimated: >60 mL/min (ref >60)
Glucose, Bld: 125 mg/dL — ABNORMAL HIGH (ref 70–99)
Potassium: 3.6 mmol/L (ref 3.5–5.1)
Sodium: 137 mmol/L (ref 135–145)

## 2021-04-07 NOTE — Progress Notes (Signed)
Location:  [facility] Room Number: 144 Place of Service:  SNF (31)   CODE STATUS: dnr  No Known Allergies  Chief Complaint  Patient presents with  . Acute Visit    + covid     HPI:  She has been tested positive for covid. There are no reports of fevers; no cough; no body aches. No reports of loss of smell or taste.    Past Medical History:  Diagnosis Date  . Anxiety   . Arthritis   . Asthma   . Cancer ([place])    breast  . Depression   . Diverticulosis   . GERD (gastroesophageal reflux disease)   . HTN (hypertension)   . Shortness of breath    exertion  . Sleep apnea    STOP BANG 4  . Thyroid dysfunction     Past Surgical History:  Procedure Laterality Date  . ABDOMINAL HYSTERECTOMY    . arthroscopic surgery rt knee  1997  . BREAST SURGERY  2001   LEFT/ CANCER   . broken radius ulna  1990   left  . broken right ankle  1976  . EXPLORATORY LAPAROTOMY  08/24/2020   with partial sigmoidectomy creat of end colostomy and application of negative pressure dressing.     Marland Kitchen JOINT REPLACEMENT  right knee 2005 [place]  . left knee  2004  . rt foot bone spur removed  1992  . THYROID SURGERY  1983  . TOTAL KNEE ARTHROPLASTY  01/22/2012   Procedure: TOTAL KNEE ARTHROPLASTY;  Surgeon: Carole Civil, MD;  Location: [facility];  Service: Orthopedics;  Laterality: Left;  . TUBAL LIGATION      Social History   Socioeconomic History  . Marital status: Widowed    Spouse name: Not on file  . Number of children: Not on file  . Years of education: Not on file  . Highest education level: Not on file  Occupational History  . Not on file  Tobacco Use  . Smoking status: Former  . Smokeless tobacco: Former  Media planner  . Vaping Use: Never used  Substance and Sexual Activity  . Alcohol use: No  . Drug use: No  . Sexual activity: Never  Other Topics Concern  . Not on file  Social History Narrative  . Not on file   Social Determinants of Health    Financial Resource Strain: Not on file  Food Insecurity: Not on file  Transportation Needs: Not on file  Physical Activity: Not on file  Stress: Not on file  Social Connections: Not on file  Intimate Partner Violence: Not on file   Family History  Problem Relation Age of Onset  . Pseudochol deficiency Neg Hx   . Malignant hyperthermia Neg Hx   . Hypotension Neg Hx   . Anesthesia problems Neg Hx       VITAL SIGNS BP 114/70   Pulse 72   Temp 97.8 F (36.6 C)   Resp 18   Ht 4' 9" (1.448 m)   Wt 179 lb 6.4 oz (81.4 kg)   SpO2 92%   BMI 38.82 kg/m   Outpatient Encounter Medications as of 04/07/2021  Medication Sig  . acetaminophen (TYLENOL) 500 MG tablet Take 500 mg by mouth every 8 (eight) hours as needed.  . Amino Acids-Protein Hydrolys (FEEDING SUPPLEMENT, PRO-STAT 64,) LIQD Take 30 mLs by mouth in the morning and at bedtime.  Marland Kitchen atenolol (TENORMIN) 50 MG tablet Take 50 mg by  mouth every evening.  Marland Kitchen BREO ELLIPTA 200-25 MCG/INH AEPB Inhale 1 puff into the lungs daily.  . chlorthalidone (HYGROTON) 25 MG tablet Take 25 mg by mouth daily.  . cholecalciferol (VITAMIN D) 1000 UNITS tablet Take 2,000 Units by mouth every morning.  . Cyanocobalamin 1000 MCG/ML KIT Inject as directed. Once a day on Saturday from 09/04/2020-10/02/2020. Then take one injection once a month starting 11/04/2020  . DULoxetine (CYMBALTA) 60 MG capsule Take 60 mg by mouth daily.  . Ensure (ENSURE) Take 1 Can by mouth 2 (two) times daily between meals.  Marland Kitchen loratadine (CLARITIN) 10 MG tablet Take 10 mg by mouth daily.  . melatonin 3 MG TABS tablet Take 3 mg by mouth at bedtime.  Gus Height EUA 200 mg CAPS Take 4 capsules by mouth 2 (two) times daily.  . Multiple Vitamins-Minerals (CENTRUM PO) Take by mouth.  . Multiple Vitamins-Minerals (PRESERVISION AREDS 2) CAPS Take 1 capsule by mouth in the morning and at bedtime.  . NON FORMULARY Diet: __x___ Regular, ______ NAS, _______Consistent  Carbohydrate, _______NPO _____Other  . nystatin (MYCOSTATIN/NYSTOP) powder Apply 1 application topically daily as needed. Special Instructions: Apply thin layer of Nystatin powder to skin around stoma when changing colostomy bag PRN irritation.  . pantoprazole (PROTONIX) 40 MG tablet Take 40 mg by mouth 2 (two) times daily.  . potassium chloride SA (KLOR-CON) 20 MEQ tablet Take 40 mEq by mouth 2 (two) times daily.  . vitamin C (ASCORBIC ACID) 500 MG tablet Take 500 mg by mouth 2 (two) times daily.  . Vitamin D, Ergocalciferol, (DRISDOL) 1.25 MG (50000 UNIT) CAPS capsule Take 50,000 Units by mouth every 7 (seven) days.  . Zinc 23 MG LOZG Use as directed 1 lozenge in the mouth or throat 5 (five) times daily.   No facility-administered encounter medications on file as of 04/07/2021.     SIGNIFICANT DIAGNOSTIC EXAMS   PREVIOUS   08-21-20; ct of abdomen and pelvis:  1. Distended and fluid filled obstructed large bowel with focal transition point in the sigmoid colon, raising concern for underlying stricture or mass in this region. No evidence of pneumatosis or perforation.  2. Gastric suction tube tip terminates in the duodenum consider slight retraction  08-21-20: ct of abdomen and pelvis 1. Gallbladder/biliary layering hyperdense material in the gallbladder may reflect vicarious excretion of contrast or sludge  2. Mesentery somewhat tortuous structure that matches blood pool adjacent to the proximal SMA favored to reflect a varicosity or vascular anastomosis 3. GI tract: colonic diverticulosis without evidence of diverticulitis  4. Vascular aortobilliac atherosclerosis  5. MSK; reverse s-shaped scoliotic curvature of the spine. L1 compression fracture status post cement augmentation  Osteopenia and poly articular degenerative changes.   NO NEW LABS.   LABS REVIEWED PREVIOUS  08-19-20 vit B12: 176 08-22-20: wbc 8.2; hgb 10.3; hct 31.4; mcv 101.4 plt 151 glucose 58; bun 10 creat 0.25; k+  4.3; na++ 132 ca 7.4 mag 1.0 phos 1.2  08-23-20: glucose 100 bun 15; creat 0.50 k+ 4.3 na++ 133; ca 8.8  mag 2.8  08-31-20: wbc 6.0; hgb 10.7; hct 32.1; mcv 102.0 plt 288; glucose 153; bun 6; creat 0.42; k+ 3.4; na++ 134  09-06-20: wbc 6.3; hgb 11.4 hct 36.2 mcv 104.9; plt 340; glucose 98 bun 10; creat 0.61; k+ 3.6; na++ 133; ca 9.2 liver normal albumin 2.6 09-20-20: wbc 7.0; hgb 13.9; hct 43.1; mcv 103.1 plt 332 01-06-21: wb 6.6; hgb 14.5; hct 44.2; mcv 102.1 plt 257; glucose 117; bun  28; creat 0.63; k+ 3.1; na++ 138; ca 9.5 GFR>60; live normal albumin 3.6 02-10-21: glucose 89; bun 27; creat 0.66; k+ 2.8; na++ 138; ca 9.2 GFR>60 02-11-21: k+ 4.6   TODAY  04-06-21; wbc 6.8; hgb 13.9; hct 42.2; mcv 104.5 plt 228; d-dimer 0.55 CRP 2.3   04-07-21: glucose 125; bun 25; creat 0.70; k+ 3.6; na++ 137; ca 9.1 GFR>60  Review of Systems  Unable to perform ROS: Dementia (unable to participate)   Physical Exam Constitutional:      General: She is not in acute distress.    Appearance: She is well-developed. She is not diaphoretic.  Neck:     Thyroid: No thyromegaly.  Cardiovascular:     Rate and Rhythm: Normal rate and regular rhythm.     Pulses: Normal pulses.     Heart sounds: Normal heart sounds.  Pulmonary:     Effort: Pulmonary effort is normal. No respiratory distress.     Breath sounds: Normal breath sounds.  Abdominal:     General: Bowel sounds are normal. There is no distension.     Palpations: Abdomen is soft.     Tenderness: There is no abdominal tenderness.     Comments: Colostomy   Musculoskeletal:     Cervical back: Neck supple.     Right lower leg: No edema.     Left lower leg: No edema.     Comments: Is able to move all extremities Scoliosis    Lymphadenopathy:     Cervical: No cervical adenopathy.  Skin:    General: Skin is warm and dry.  Neurological:     Mental Status: She is alert. Mental status is at baseline.  Psychiatric:        Mood and Affect: Mood normal.      ASSESSMENT/ PLAN:  TODAY  SARS CoV-2 positive  Will begin vitamin c, d, and zinc. Will get chest x-ray;  Will monitor her status.    Ok Edwards NP [facility]  Contact [phone] Monday through Friday 8am- 5pm  After hours call [phone]

## 2021-04-11 DIAGNOSIS — U071 COVID-19: Secondary | ICD-10-CM | POA: Insufficient documentation

## 2021-04-19 ENCOUNTER — Encounter: Payer: Self-pay | Admitting: Internal Medicine

## 2021-04-19 ENCOUNTER — Non-Acute Institutional Stay (SKILLED_NURSING_FACILITY): Payer: Medicare Other | Admitting: Internal Medicine

## 2021-04-19 DIAGNOSIS — I10 Essential (primary) hypertension: Secondary | ICD-10-CM | POA: Diagnosis not present

## 2021-04-19 DIAGNOSIS — U071 COVID-19: Secondary | ICD-10-CM | POA: Diagnosis not present

## 2021-04-19 DIAGNOSIS — E43 Unspecified severe protein-calorie malnutrition: Secondary | ICD-10-CM | POA: Diagnosis not present

## 2021-04-19 NOTE — Assessment & Plan Note (Addendum)
BP controlled; no change in antihypertensive medications Current K+ 3.6 on Chlorthalidone

## 2021-04-19 NOTE — Assessment & Plan Note (Addendum)
Resolved as documented by 01/06/21 total protein 6.7 & albumin 3.6

## 2021-04-19 NOTE — Assessment & Plan Note (Addendum)
Residual cough, otherwise asymptomatic.She is completing quaratine

## 2021-04-19 NOTE — Patient Instructions (Signed)
See assessment and plan under each diagnosis in the problem list and acutely for this visit 

## 2021-04-19 NOTE — Progress Notes (Signed)
   NURSING HOME LOCATION:  [facility] ROOM NUMBER:  [id number]  CODE STATUS:  DNR  PCP:  Ok Edwards NP  This is a nursing facility follow up visit of chronic medical diagnoses & to document compliance with Regulation 483.30 (c) in The [place] Manual Phase 2 which mandates caregiver visit ( visits can alternate among physician, PA or NP as per statutes) within 10 days of 30 days / 60 days/ 90 days post admission to SNF date    Interim medical record and care since last SNF visit was updated with review of diagnostic studies and change in clinical status since last visit were documented.  HPI: She is a permanent resident of this facility with diagnoses of history of asthma, history of breast cancer, history diverticulosis, GERD, essential hypertension, sleep apnea, thyroid dysfunction, and history of depression. Surgeries and procedures include abdominal hysterectomy, ostomy, and breast surgery.  Review of systems: "I feel good". Her only complaint is cough with minimal sputum.  Constitutional: No fever, significant weight change, fatigue  Eyes: No redness, discharge, pain, vision change ENT/mouth: No nasal congestion,  purulent discharge, earache, change in hearing, sore throat  Cardiovascular: No chest pain, palpitations, paroxysmal nocturnal dyspnea, claudication, edema  Respiratory: No  hemoptysis, significant snoring, apnea   Gastrointestinal: No heartburn, dysphagia, abdominal pain, nausea /vomiting, rectal bleeding, melena, change in bowels Genitourinary: No dysuria, hematuria, pyuria, incontinence, nocturia Musculoskeletal: No joint stiffness, joint swelling, weakness, pain Dermatologic: No rash, pruritus, change in appearance of skin Neurologic: No dizziness, headache, syncope, seizures, numbness, tingling Psychiatric: No significant anxiety, depression, insomnia, anorexia Endocrine: No change in hair/skin/nails, excessive thirst, excessive hunger,  excessive urination  Hematologic/lymphatic: No significant bruising, lymphadenopathy, abnormal bleeding Allergy/immunology: No itchy/watery eyes, significant sneezing, urticaria, angioedema  Physical exam:  Pertinent or positive findings: She exhibits morbid obese. She is hard of hearing.Complete dentures present. Breath sounds decreased.Abdomen protuberant.Ostomy present. L DPP stronger than other pedal pulses. DJD finger changes present.  General appearance: no acute distress, increased work of breathing is present.   Lymphatic: No lymphadenopathy about the head, neck, axilla. Eyes: No conjunctival inflammation or lid edema is present. There is no scleral icterus. Ears:  External ear exam shows no significant lesions or deformities.   Nose:  External nasal examination shows no deformity or inflammation. Nasal mucosa are pink and moist without lesions, exudates Oral exam:  There is no oropharyngeal erythema or exudate. Neck:  No thyromegaly, masses, tenderness noted.    Heart:  Normal rate and regular rhythm. S1 and S2 normal without gallop, murmur, click, rub .  Lungs: without wheezes, rhonchi, rales, rubs. Abdomen: Bowel sounds are normal. Abdomen is soft and nontender with no organomegaly, hernias, masses. GU: Deferred  Extremities:  No cyanosis, clubbing, edema  Neurologic exam :Balance, Rhomberg, finger to nose testing could not be completed due to clinical state Skin: Warm & dry w/o tenting. No significant lesions or rash.  See summary under each active problem in the Problem List with associated updated therapeutic plan

## 2021-04-25 DIAGNOSIS — L603 Nail dystrophy: Secondary | ICD-10-CM | POA: Diagnosis not present

## 2021-04-25 DIAGNOSIS — L602 Onychogryphosis: Secondary | ICD-10-CM | POA: Diagnosis not present

## 2021-04-25 DIAGNOSIS — I739 Peripheral vascular disease, unspecified: Secondary | ICD-10-CM | POA: Diagnosis not present

## 2021-05-24 ENCOUNTER — Non-Acute Institutional Stay (SKILLED_NURSING_FACILITY): Payer: Medicare Other | Admitting: Adult Health

## 2021-05-24 ENCOUNTER — Encounter: Payer: Self-pay | Admitting: Adult Health

## 2021-05-24 DIAGNOSIS — J3089 Other allergic rhinitis: Secondary | ICD-10-CM | POA: Diagnosis not present

## 2021-05-24 NOTE — Progress Notes (Signed)
Location:  [facility] Room Number: 126-P Place of Service:  SNF (31)   CODE STATUS: DNR  No Known Allergies  Chief Complaint  Patient presents with   Acute Visit    Cough and runny nose.    HPI:  Staff report that for her past couple of days she has had a cough and congestion. There is no reports of sputum production. No sore throat; no headaches no shortness of breath. No reports of fevers.   Past Medical History:  Diagnosis Date   Anxiety    Arthritis    Asthma    Cancer ([place])    breast   Depression    Diverticulosis    GERD (gastroesophageal reflux disease)    HTN (hypertension)    Shortness of breath    exertion   Sleep apnea    STOP BANG 4   Thyroid dysfunction     Past Surgical History:  Procedure Laterality Date   ABDOMINAL HYSTERECTOMY     arthroscopic surgery rt knee  1997   BREAST SURGERY  2001   LEFT/ CANCER    broken radius ulna  1990   left   broken right ankle  1976   EXPLORATORY LAPAROTOMY  08/24/2020   with partial sigmoidectomy creat of end colostomy and application of negative pressure dressing.      JOINT REPLACEMENT  right knee 2005 [place]   left knee  2004   rt foot bone spur removed  [place]  01/22/2012   Procedure: TOTAL KNEE ARTHROPLASTY;  Surgeon: Carole Civil, MD;  Location: [facility];  Service: Orthopedics;  Laterality: Left;   TUBAL LIGATION      Social History   Socioeconomic History   Marital status: Widowed    Spouse name: Not on file   Number of children: Not on file   Years of education: Not on file   Highest education level: Not on file  Occupational History   Not on file  Tobacco Use   Smoking status: Former   Smokeless tobacco: Former  Scientific laboratory technician Use: Never used  Substance and Sexual Activity   Alcohol use: No   Drug use: No   Sexual activity: Never  Other Topics Concern   Not on file  Social History Narrative   Not on  file   Social Determinants of Health   Financial Resource Strain: Not on file  Food Insecurity: Not on file  Transportation Needs: Not on file  Physical Activity: Not on file  Stress: Not on file  Social Connections: Not on file  Intimate Partner Violence: Not on file   Family History  Problem Relation Age of Onset   Pseudochol deficiency Neg Hx    Malignant hyperthermia Neg Hx    Hypotension Neg Hx    Anesthesia problems Neg Hx       VITAL SIGNS BP 124/73   Pulse 60   Temp 97.9 F (36.6 C)   Resp 18   Ht '4\' 9"'  (1.448 m)   Wt 177 lb 3.2 oz (80.4 kg)   SpO2 99%   BMI 38.35 kg/m   Outpatient Encounter Medications as of 05/24/2021  Medication Sig   acetaminophen (TYLENOL) 500 MG tablet Take 500 mg by mouth every 8 (eight) hours as needed.   acetaminophen (TYLENOL) 500 MG tablet Take 500 mg by mouth at bedtime.   alendronate (FOSAMAX) 70 MG tablet Take 70  mg by mouth once a week. Every Sunday take with a full glass of water on an empty stomach.   Amino Acids-Protein Hydrolys (FEEDING SUPPLEMENT, PRO-STAT 64,) LIQD Take 30 mLs by mouth in the morning and at bedtime.   atenolol (TENORMIN) 50 MG tablet Take 50 mg by mouth every evening.   BREO ELLIPTA 200-25 MCG/INH AEPB Inhale 1 puff into the lungs daily.   Calcium Carb-Cholecalciferol (CALCIUM 500 + D PO) Take 1 tablet by mouth daily.   chlorthalidone (HYGROTON) 25 MG tablet Take 25 mg by mouth daily.   cholecalciferol (VITAMIN D) 1000 UNITS tablet Take 2,000 Units by mouth every morning.   Cyanocobalamin 1000 MCG/ML KIT Inject as directed. Once a day on Saturday from 09/04/2020-10/02/2020. Then take one injection once a month starting 11/04/2020   DULoxetine (CYMBALTA) 20 MG capsule Take 40 mg by mouth daily.   loratadine (CLARITIN) 10 MG tablet Take 10 mg by mouth daily.   melatonin 3 MG TABS tablet Take 3 mg by mouth at bedtime.   Multiple Vitamins-Minerals (CENTRUM PO) Take by mouth.   Multiple Vitamins-Minerals  (PRESERVISION AREDS 2) CAPS Take 1 capsule by mouth in the morning and at bedtime.   NON FORMULARY Diet: __x___ Regular, ______ NAS, _______Consistent Carbohydrate, _______NPO _____Other   nystatin (MYCOSTATIN/NYSTOP) powder Apply 1 application topically daily as needed. Special Instructions: Apply thin layer of Nystatin powder to skin around stoma when changing colostomy bag PRN irritation.   pantoprazole (PROTONIX) 40 MG tablet Take 40 mg by mouth 2 (two) times daily.   potassium chloride SA (KLOR-CON) 20 MEQ tablet Take 40 mEq by mouth 2 (two) times daily.   [DISCONTINUED] DULoxetine (CYMBALTA) 60 MG capsule Take 60 mg by mouth daily.   [DISCONTINUED] Ensure (ENSURE) Take 1 Can by mouth 2 (two) times daily between meals.   No facility-administered encounter medications on file as of 05/24/2021.     SIGNIFICANT DIAGNOSTIC EXAMS  PREVIOUS   08-21-20; ct of abdomen and pelvis:  1. Distended and fluid filled obstructed large bowel with focal transition point in the sigmoid colon, raising concern for underlying stricture or mass in this region. No evidence of pneumatosis or perforation.  2. Gastric suction tube tip terminates in the duodenum consider slight retraction  08-21-20: ct of abdomen and pelvis 1. Gallbladder/biliary layering hyperdense material in the gallbladder may reflect vicarious excretion of contrast or sludge  2. Mesentery somewhat tortuous structure that matches blood pool adjacent to the proximal SMA favored to reflect a varicosity or vascular anastomosis 3. GI tract: colonic diverticulosis without evidence of diverticulitis  4. Vascular aortobilliac atherosclerosis  5. MSK; reverse s-shaped scoliotic curvature of the spine. L1 compression fracture status post cement augmentation  Osteopenia and poly articular degenerative changes.   NO NEW LABS.   LABS REVIEWED PREVIOUS  08-19-20 vit B12: 176 08-22-20: wbc 8.2; hgb 10.3; hct 31.4; mcv 101.4 plt 151 glucose 58; bun  10 creat 0.25; k+ 4.3; na++ 132 ca 7.4 mag 1.0 phos 1.2  08-23-20: glucose 100 bun 15; creat 0.50 k+ 4.3 na++ 133; ca 8.8  mag 2.8  08-31-20: wbc 6.0; hgb 10.7; hct 32.1; mcv 102.0 plt 288; glucose 153; bun 6; creat 0.42; k+ 3.4; na++ 134  09-06-20: wbc 6.3; hgb 11.4 hct 36.2 mcv 104.9; plt 340; glucose 98 bun 10; creat 0.61; k+ 3.6; na++ 133; ca 9.2 liver normal albumin 2.6 09-20-20: wbc 7.0; hgb 13.9; hct 43.1; mcv 103.1 plt 332 01-06-21: wb 6.6; hgb 14.5; hct 44.2; mcv 102.1  plt 257; glucose 117; bun 28; creat 0.63; k+ 3.1; na++ 138; ca 9.5 GFR>60; live normal albumin 3.6 02-10-21: glucose 89; bun 27; creat 0.66; k+ 2.8; na++ 138; ca 9.2 GFR>60 02-11-21: k+ 4.6  04-06-21; wbc 6.8; hgb 13.9; hct 42.2; mcv 104.5 plt 228; d-dimer 0.55 CRP 2.3   04-07-21: glucose 125; bun 25; creat 0.70; k+ 3.6; na++ 137; ca 9.1 GFR>60  NO NEW LABS.   Review of Systems  Unable to perform ROS: Dementia (unable to participate)   Physical Exam Constitutional:      General: She is not in acute distress.    Appearance: She is well-developed. She is not diaphoretic.  HENT:     Nose: Nose normal.     Mouth/Throat:     Mouth: Mucous membranes are moist.     Pharynx: Oropharynx is clear.  Eyes:     Conjunctiva/sclera: Conjunctivae normal.  Neck:     Thyroid: No thyromegaly.  Cardiovascular:     Rate and Rhythm: Normal rate and regular rhythm.     Heart sounds: Normal heart sounds.  Pulmonary:     Effort: Pulmonary effort is normal. No respiratory distress.     Breath sounds: Normal breath sounds.  Abdominal:     General: Bowel sounds are normal. There is no distension.     Palpations: Abdomen is soft.     Tenderness: There is no abdominal tenderness.     Comments: Colostomy   Musculoskeletal:        General: Normal range of motion.     Cervical back: Neck supple.     Right lower leg: No edema.     Left lower leg: No edema.     Comments: Scoliosis   Lymphadenopathy:     Cervical: No cervical adenopathy.   Skin:    General: Skin is warm and dry.  Neurological:     Mental Status: She is alert. Mental status is at baseline.  Psychiatric:        Mood and Affect: Mood normal.      ASSESSMENT/ PLAN:  TODAY  Non-seasonal allergic rhinitis unspecified trigger:   will begin mucinex 600 mg twice daily for one week.    Ok Edwards NP [facility]  Contact [phone] Monday through Friday 8am- 5pm  After hours call [phone]

## 2021-05-25 ENCOUNTER — Encounter: Payer: Self-pay | Admitting: Adult Health

## 2021-05-25 ENCOUNTER — Non-Acute Institutional Stay (SKILLED_NURSING_FACILITY): Payer: Medicare Other | Admitting: Adult Health

## 2021-05-25 DIAGNOSIS — J3089 Other allergic rhinitis: Secondary | ICD-10-CM

## 2021-05-25 DIAGNOSIS — K219 Gastro-esophageal reflux disease without esophagitis: Secondary | ICD-10-CM

## 2021-05-25 DIAGNOSIS — I10 Essential (primary) hypertension: Secondary | ICD-10-CM | POA: Diagnosis not present

## 2021-05-25 NOTE — Progress Notes (Signed)
Location:  [facility] Room Number: 126 Place of Service:  SNF (31) Sincere Liuzzi,NP   CODE STATUS: DNR  No Known Allergies  Chief Complaint  Patient presents with   Medical Management of Chronic Issues          Non-seasonal allergic rhinitis unspecified trigger:  GERD without esophagitis:  Essential hypertension:    HPI:  She is a 88 year old long term resident of this facility being seen for the management of her chronic illnesses:  Non-seasonal allergic rhinitis unspecified trigger:  GERD without esophagitis:  Essential hypertension. There are no reports of uncontrolled pain; no reports of changes in appetite; no reports of anxiety or depressive thoughts.   Past Medical History:  Diagnosis Date   Anxiety    Arthritis    Asthma    Cancer ([place])    breast   Depression    Diverticulosis    GERD (gastroesophageal reflux disease)    HTN (hypertension)    Shortness of breath    exertion   Sleep apnea    STOP BANG 4   Thyroid dysfunction     Past Surgical History:  Procedure Laterality Date   ABDOMINAL HYSTERECTOMY     arthroscopic surgery rt knee  1997   BREAST SURGERY  2001   LEFT/ CANCER    broken radius ulna  1990   left   broken right ankle  1976   EXPLORATORY LAPAROTOMY  08/24/2020   with partial sigmoidectomy creat of end colostomy and application of negative pressure dressing.      JOINT REPLACEMENT  right knee 2005 [place]   left knee  2004   rt foot bone spur removed  [place]  01/22/2012   Procedure: TOTAL KNEE ARTHROPLASTY;  Surgeon: Carole Civil, MD;  Location: [facility];  Service: Orthopedics;  Laterality: Left;   TUBAL LIGATION      Social History   Socioeconomic History   Marital status: Widowed    Spouse name: Not on file   Number of children: Not on file   Years of education: Not on file   Highest education level: Not on file  Occupational History   Not on file   Tobacco Use   Smoking status: Former   Smokeless tobacco: Former  Scientific laboratory technician Use: Never used  Substance and Sexual Activity   Alcohol use: No   Drug use: No   Sexual activity: Never  Other Topics Concern   Not on file  Social History Narrative   Not on file   Social Determinants of Health   Financial Resource Strain: Not on file  Food Insecurity: Not on file  Transportation Needs: Not on file  Physical Activity: Not on file  Stress: Not on file  Social Connections: Not on file  Intimate Partner Violence: Not on file   Family History  Problem Relation Age of Onset   Pseudochol deficiency Neg Hx    Malignant hyperthermia Neg Hx    Hypotension Neg Hx    Anesthesia problems Neg Hx       VITAL SIGNS BP (!) 115/56   Pulse 70   Temp 98.6 F (37 C)   Resp 18   Ht _0  (1.448 m)   Wt 180 lb 9.6 oz (81.9 kg)   SpO2 99%   BMI 39.08 kg/m   Outpatient Encounter Medications as of 05/25/2021  Medication Sig   acetaminophen (TYLENOL)  500 MG tablet Take 500 mg by mouth every 8 (eight) hours as needed.   acetaminophen (TYLENOL) 500 MG tablet Take 500 mg by mouth at bedtime.   alendronate (FOSAMAX) 70 MG tablet Take 70 mg by mouth once a week. Every Sunday take with a full glass of water on an empty stomach.   Amino Acids-Protein Hydrolys (FEEDING SUPPLEMENT, PRO-STAT 64,) LIQD Take 30 mLs by mouth in the morning and at bedtime.   atenolol (TENORMIN) 50 MG tablet Take 50 mg by mouth every evening.   BREO ELLIPTA 200-25 MCG/INH AEPB Inhale 1 puff into the lungs daily.   Calcium Carb-Cholecalciferol (CALCIUM 500 + D PO) Take 1 tablet by mouth daily.   chlorthalidone (HYGROTON) 25 MG tablet Take 25 mg by mouth daily.   cholecalciferol (VITAMIN D) 1000 UNITS tablet Take 2,000 Units by mouth every morning.   Cyanocobalamin 1000 MCG/ML KIT Inject as directed. Once a day on Saturday from 09/04/2020-10/02/2020. Then take one injection once a month starting 11/04/2020    DULoxetine (CYMBALTA) 20 MG capsule Take 40 mg by mouth daily.   guaiFENesin (MUCINEX) 600 MG 12 hr tablet Take by mouth 2 (two) times daily.   loratadine (CLARITIN) 10 MG tablet Take 10 mg by mouth daily.   melatonin 3 MG TABS tablet Take 3 mg by mouth at bedtime.   Multiple Vitamins-Minerals (CENTRUM PO) Take by mouth.   Multiple Vitamins-Minerals (PRESERVISION AREDS 2) CAPS Take 1 capsule by mouth in the morning and at bedtime.   NON FORMULARY Diet: __x___ Regular, ______ NAS, _______Consistent Carbohydrate, _______NPO _____Other   nystatin (MYCOSTATIN/NYSTOP) powder Apply 1 application topically daily as needed. Special Instructions: Apply thin layer of Nystatin powder to skin around stoma when changing colostomy bag PRN irritation.   pantoprazole (PROTONIX) 40 MG tablet Take 40 mg by mouth 2 (two) times daily.   potassium chloride SA (KLOR-CON) 20 MEQ tablet Take 40 mEq by mouth 2 (two) times daily.   No facility-administered encounter medications on file as of 05/25/2021.     SIGNIFICANT DIAGNOSTIC EXAMS  PREVIOUS   08-21-20; ct of abdomen and pelvis:  1. Distended and fluid filled obstructed large bowel with focal transition point in the sigmoid colon, raising concern for underlying stricture or mass in this region. No evidence of pneumatosis or perforation.  2. Gastric suction tube tip terminates in the duodenum consider slight retraction  08-21-20: ct of abdomen and pelvis 1. Gallbladder/biliary layering hyperdense material in the gallbladder may reflect vicarious excretion of contrast or sludge  2. Mesentery somewhat tortuous structure that matches blood pool adjacent to the proximal SMA favored to reflect a varicosity or vascular anastomosis 3. GI tract: colonic diverticulosis without evidence of diverticulitis  4. Vascular aortobilliac atherosclerosis  5. MSK; reverse s-shaped scoliotic curvature of the spine. L1 compression fracture status post cement augmentation   Osteopenia and poly articular degenerative changes.   NO NEW LABS.   LABS REVIEWED PREVIOUS  08-19-20 vit B12: 176 08-22-20: wbc 8.2; hgb 10.3; hct 31.4; mcv 101.4 plt 151 glucose 58; bun 10 creat 0.25; k+ 4.3; na++ 132 ca 7.4 mag 1.0 phos 1.2  08-23-20: glucose 100 bun 15; creat 0.50 k+ 4.3 na++ 133; ca 8.8  mag 2.8  08-31-20: wbc 6.0; hgb 10.7; hct 32.1; mcv 102.0 plt 288; glucose 153; bun 6; creat 0.42; k+ 3.4; na++ 134  09-06-20: wbc 6.3; hgb 11.4 hct 36.2 mcv 104.9; plt 340; glucose 98 bun 10; creat 0.61; k+ 3.6; na++ 133; ca 9.2 liver  normal albumin 2.6 09-20-20: wbc 7.0; hgb 13.9; hct 43.1; mcv 103.1 plt 332 01-06-21: wb 6.6; hgb 14.5; hct 44.2; mcv 102.1 plt 257; glucose 117; bun 28; creat 0.63; k+ 3.1; na++ 138; ca 9.5 GFR>60; live normal albumin 3.6 02-10-21: glucose 89; bun 27; creat 0.66; k+ 2.8; na++ 138; ca 9.2 GFR>60 02-11-21: k+ 4.6   TODAY  04-06-21: wbc 6.8; hgb 13.9; hct 42.2; mcv 104.5 plt 228; glucose 125; bun 25; creat 0.70; k+ 3.6; na++ 137; ca 9.1; GFR>60; d-dimer 0.55; CRP 2.3    Review of Systems  Unable to perform ROS: Dementia (unable to participate)   Physical Exam Constitutional:      General: She is not in acute distress.    Appearance: She is well-developed. She is not diaphoretic.  Neck:     Thyroid: No thyromegaly.  Cardiovascular:     Rate and Rhythm: Normal rate and regular rhythm.     Pulses: Normal pulses.     Heart sounds: Normal heart sounds.  Pulmonary:     Effort: Pulmonary effort is normal. No respiratory distress.     Breath sounds: Normal breath sounds.  Abdominal:     General: Bowel sounds are normal. There is no distension.     Palpations: Abdomen is soft.     Tenderness: There is no abdominal tenderness.     Comments: Colostomy   Musculoskeletal:        General: Normal range of motion.     Cervical back: Neck supple.     Right lower leg: No edema.     Left lower leg: No edema.     Comments: Scoliosis   Lymphadenopathy:      Cervical: No cervical adenopathy.  Skin:    General: Skin is warm and dry.  Neurological:     Mental Status: She is alert. Mental status is at baseline.  Psychiatric:        Mood and Affect: Mood normal.     ASSESSMENT/ PLAN:  TODAY  Non-seasonal allergic rhinitis unspecified trigger: is without change: will continue claritin 10 mg daily flonase daily and will complete mucinex will monitor her status.   2. GERD without esophagitis: is stable will continue protonix 40 mg twice daily   3. Essential hypertension: is stable b/p 115/56 will continue tenormin 50 mg daily and hygroton 25 mg daily   PREVIOUS    4. Hypokalemia: is stable k+ 3.6 will continue k+ 40 meq twice daily   5. Major depression recurrent chronic: is stable will continue cymbalta 40 mg daily melatonin 3 mg nightly for sleep; is off ativan  6. Aortoiliac atherosclerosis: (ct 08-21-20) will monitor   7. Large bowel obstruction/status post exploratory lap/status post colostomy will continue to monitor   8. Degenerative disc disease lumbar: is stable will continue lyrica 75 mg nightly cymbalta 60 mg daily   9. Moderate persistent asthma in adult: is stable will continue breo ellipta 200/25 mcg 1 puff daily    Will check vitamin B 12 and dexa scan      Ok Edwards NP [facility]  Contact [phone] Monday through Friday 8am- 5pm  After hours call [phone]

## 2021-06-01 ENCOUNTER — Encounter: Payer: Self-pay | Admitting: Adult Health

## 2021-06-02 ENCOUNTER — Other Ambulatory Visit (HOSPITAL_COMMUNITY)
Admission: RE | Admit: 2021-06-02 | Discharge: 2021-06-02 | Disposition: A | Discharge status: Home / Self Care | Payer: Medicare Other | Source: Skilled Nursing Facility | Attending: Adult Health | Admitting: Adult Health

## 2021-06-02 DIAGNOSIS — D519 Vitamin B12 deficiency anemia, unspecified: Secondary | ICD-10-CM | POA: Insufficient documentation

## 2021-06-02 LAB — VITAMIN B12: Vitamin B-12: 447 pg/mL (ref 180–914)

## 2021-06-02 LAB — FOLATE: Folate: 29.1 ng/mL (ref >5.9)

## 2021-06-13 DIAGNOSIS — M5137 Other intervertebral disc degeneration, lumbosacral region: Secondary | ICD-10-CM | POA: Diagnosis not present

## 2021-06-13 DIAGNOSIS — M6281 Muscle weakness (generalized): Secondary | ICD-10-CM | POA: Diagnosis not present

## 2021-06-13 DIAGNOSIS — R262 Difficulty in walking, not elsewhere classified: Secondary | ICD-10-CM | POA: Diagnosis not present

## 2021-06-15 DIAGNOSIS — R262 Difficulty in walking, not elsewhere classified: Secondary | ICD-10-CM | POA: Diagnosis not present

## 2021-06-15 DIAGNOSIS — M5137 Other intervertebral disc degeneration, lumbosacral region: Secondary | ICD-10-CM | POA: Diagnosis not present

## 2021-06-15 DIAGNOSIS — M6281 Muscle weakness (generalized): Secondary | ICD-10-CM | POA: Diagnosis not present

## 2021-06-17 ENCOUNTER — Non-Acute Institutional Stay (SKILLED_NURSING_FACILITY): Payer: Medicare Other | Admitting: Adult Health

## 2021-06-17 ENCOUNTER — Encounter: Payer: Self-pay | Admitting: Adult Health

## 2021-06-17 DIAGNOSIS — I700 Atherosclerosis of aorta: Secondary | ICD-10-CM

## 2021-06-17 DIAGNOSIS — F339 Major depressive disorder, recurrent, unspecified: Secondary | ICD-10-CM | POA: Diagnosis not present

## 2021-06-17 DIAGNOSIS — M5137 Other intervertebral disc degeneration, lumbosacral region: Secondary | ICD-10-CM | POA: Diagnosis not present

## 2021-06-17 DIAGNOSIS — I708 Atherosclerosis of other arteries: Secondary | ICD-10-CM

## 2021-06-17 DIAGNOSIS — R262 Difficulty in walking, not elsewhere classified: Secondary | ICD-10-CM | POA: Diagnosis not present

## 2021-06-17 DIAGNOSIS — E43 Unspecified severe protein-calorie malnutrition: Secondary | ICD-10-CM

## 2021-06-17 DIAGNOSIS — M6281 Muscle weakness (generalized): Secondary | ICD-10-CM | POA: Diagnosis not present

## 2021-06-17 NOTE — Progress Notes (Addendum)
Location:  [facility] Room Number: 144-P Place of Service:  SNF (31)   CODE STATUS: DNR  No Known Allergies   Chief Complaint  Patient presents with   Acute Visit    Care plan meeting      HPI:  We have come together for her care plan meeting. BIMS 13/15  mood 3/30: decreased energy. She requires limited to extensive assist with her adls. She is frequently incontinent of bladder has colostomy. There have been no falls. Therapy none at this time. Dietary: weight is 179 pounds and is stable has a good appetite on regular diet and is on  prostat due to low albumin. She continues to be followed for her chronic illnesses including:   Aorto-iliac atherosclerosis  Protein calorie malnutrition severe  Major depression recurrent chronic  Past Medical History:  Diagnosis Date   Anxiety    Arthritis    Asthma    Cancer ([place])    breast   Depression    Diverticulosis    GERD (gastroesophageal reflux disease)    HTN (hypertension)    Shortness of breath    exertion   Sleep apnea    STOP BANG 4   Thyroid dysfunction     Past Surgical History:  Procedure Laterality Date   ABDOMINAL HYSTERECTOMY     arthroscopic surgery rt knee  1997   BREAST SURGERY  2001   LEFT/ CANCER    broken radius ulna  1990   left   broken right ankle  1976   EXPLORATORY LAPAROTOMY  08/24/2020   with partial sigmoidectomy creat of end colostomy and application of negative pressure dressing.      JOINT REPLACEMENT  right knee 2005 [place]   left knee  2004   rt foot bone spur removed  [place]  01/22/2012   Procedure: TOTAL KNEE ARTHROPLASTY;  Surgeon: Carole Civil, MD;  Location: [facility];  Service: Orthopedics;  Laterality: Left;   TUBAL LIGATION      Social History   Socioeconomic History   Marital status: Widowed    Spouse name: Not on file   Number of children: Not on file   Years of education: Not on file   Highest  education level: Not on file  Occupational History   Not on file  Tobacco Use   Smoking status: Former   Smokeless tobacco: Former  Scientific laboratory technician Use: Never used  Substance and Sexual Activity   Alcohol use: No   Drug use: No   Sexual activity: Never  Other Topics Concern   Not on file  Social History Narrative   Not on file   Social Determinants of Health   Financial Resource Strain: Not on file  Food Insecurity: Not on file  Transportation Needs: Not on file  Physical Activity: Not on file  Stress: Not on file  Social Connections: Not on file  Intimate Partner Violence: Not on file   Family History  Problem Relation Age of Onset   Pseudochol deficiency Neg Hx    Malignant hyperthermia Neg Hx    Hypotension Neg Hx    Anesthesia problems Neg Hx       VITAL SIGNS BP (!) 130/55   Pulse 60   Temp (!) 96.8 F (36 C)   Resp 18   Ht '4\' 9"'  (1.448 m)   Wt 180 lb 9.6 oz (81.9 kg)   SpO2  99%   BMI 39.08 kg/m   Outpatient Encounter Medications as of 06/17/2021  Medication Sig   acetaminophen (TYLENOL) 500 MG tablet Take 500 mg by mouth every 8 (eight) hours as needed.   acetaminophen (TYLENOL) 500 MG tablet Take 500 mg by mouth at bedtime.   alendronate (FOSAMAX) 70 MG tablet Take 70 mg by mouth once a week. Every Sunday take with a full glass of water on an empty stomach.   atenolol (TENORMIN) 50 MG tablet Take 50 mg by mouth every evening.   BREO ELLIPTA 200-25 MCG/INH AEPB Inhale 1 puff into the lungs daily.   Calcium Carb-Cholecalciferol (CALCIUM 500 + D PO) Take 1 tablet by mouth daily.   chlorthalidone (HYGROTON) 25 MG tablet Take 25 mg by mouth daily.   cholecalciferol (VITAMIN D) 1000 UNITS tablet Take 2,000 Units by mouth every morning.   Cyanocobalamin 1000 MCG/ML KIT Inject as directed. Once a day on Saturday from 09/04/2020-10/02/2020. Then take one injection once a month starting 11/04/2020   DULoxetine (CYMBALTA) 20 MG capsule Take 40 mg by mouth  daily.   loratadine (CLARITIN) 10 MG tablet Take 10 mg by mouth daily.   melatonin 3 MG TABS tablet Take 3 mg by mouth at bedtime.   Multiple Vitamins-Minerals (CENTRUM PO) Take by mouth.   Multiple Vitamins-Minerals (PRESERVISION AREDS 2) CAPS Take 1 capsule by mouth in the morning and at bedtime.   NON FORMULARY Diet:Regular   nystatin (MYCOSTATIN/NYSTOP) powder Apply 1 application topically daily as needed. Special Instructions: Apply thin layer of Nystatin powder to skin around stoma when changing colostomy bag PRN irritation.   pantoprazole (PROTONIX) 40 MG tablet Take 40 mg by mouth 2 (two) times daily.   potassium chloride SA (KLOR-CON) 20 MEQ tablet Take 40 mEq by mouth 2 (two) times daily.   [DISCONTINUED] guaiFENesin (MUCINEX) 600 MG 12 hr tablet Take by mouth 2 (two) times daily.   No facility-administered encounter medications on file as of 06/17/2021.     SIGNIFICANT DIAGNOSTIC EXAMS   PREVIOUS   08-21-20; ct of abdomen and pelvis:  1. Distended and fluid filled obstructed large bowel with focal transition point in the sigmoid colon, raising concern for underlying stricture or mass in this region. No evidence of pneumatosis or perforation.  2. Gastric suction tube tip terminates in the duodenum consider slight retraction  08-21-20: ct of abdomen and pelvis 1. Gallbladder/biliary layering hyperdense material in the gallbladder may reflect vicarious excretion of contrast or sludge  2. Mesentery somewhat tortuous structure that matches blood pool adjacent to the proximal SMA favored to reflect a varicosity or vascular anastomosis 3. GI tract: colonic diverticulosis without evidence of diverticulitis  4. Vascular aortobilliac atherosclerosis  5. MSK; reverse s-shaped scoliotic curvature of the spine. L1 compression fracture status post cement augmentation  Osteopenia and poly articular degenerative changes.   NO NEW LABS.   LABS REVIEWED PREVIOUS  08-19-20 vit B12:  176 08-22-20: wbc 8.2; hgb 10.3; hct 31.4; mcv 101.4 plt 151 glucose 58; bun 10 creat 0.25; k+ 4.3; na++ 132 ca 7.4 mag 1.0 phos 1.2  08-23-20: glucose 100 bun 15; creat 0.50 k+ 4.3 na++ 133; ca 8.8  mag 2.8  08-31-20: wbc 6.0; hgb 10.7; hct 32.1; mcv 102.0 plt 288; glucose 153; bun 6; creat 0.42; k+ 3.4; na++ 134  09-06-20: wbc 6.3; hgb 11.4 hct 36.2 mcv 104.9; plt 340; glucose 98 bun 10; creat 0.61; k+ 3.6; na++ 133; ca 9.2 liver normal albumin 2.6 09-20-20: wbc 7.0;  hgb 13.9; hct 43.1; mcv 103.1 plt 332 01-06-21: wb 6.6; hgb 14.5; hct 44.2; mcv 102.1 plt 257; glucose 117; bun 28; creat 0.63; k+ 3.1; na++ 138; ca 9.5 GFR>60; live normal albumin 3.6 02-10-21: glucose 89; bun 27; creat 0.66; k+ 2.8; na++ 138; ca 9.2 GFR>60 02-11-21: k+ 4.6  04-06-21: wbc 6.8; hgb 13.9; hct 42.2; mcv 104.5 plt 228; glucose 125; bun 25; creat 0.70; k+ 3.6; na++ 137; ca 9.1; GFR>60; d-dimer 0.55; CRP 2.3  NO NEW LABS.   Review of Systems  Unable to perform ROS: Dementia (unable to fully participate)   Physical Exam Constitutional:      General: She is not in acute distress.    Appearance: She is well-developed. She is not diaphoretic.  Neck:     Thyroid: No thyromegaly.  Cardiovascular:     Rate and Rhythm: Normal rate and regular rhythm.     Pulses: Normal pulses.     Heart sounds: Normal heart sounds.  Pulmonary:     Effort: Pulmonary effort is normal. No respiratory distress.     Breath sounds: Normal breath sounds.  Abdominal:     General: Bowel sounds are normal. There is no distension.     Palpations: Abdomen is soft.     Tenderness: There is no abdominal tenderness.     Comments: Colostomy   Musculoskeletal:        General: Normal range of motion.     Cervical back: Neck supple.     Right lower leg: No edema.     Left lower leg: No edema.     Comments: Scoliosis   Lymphadenopathy:     Cervical: No cervical adenopathy.  Skin:    General: Skin is warm and dry.  Neurological:     Mental  Status: She is alert. Mental status is at baseline.  Psychiatric:        Mood and Affect: Mood normal.      ASSESSMENT/ PLAN:  TODAY  Aorto-iliac atherosclerosis Protein calorie malnutrition severe Major depression recurrent chronic  Will continue medications Will continue current plan of care.  Will continue to monitor her status.    Time spent with patient 40 minutes: medications; plan of care. Therapy needs.    Ok Edwards NP [facility]  Contact [phone] Monday through Friday 8am- 5pm  After hours call [phone]

## 2021-06-19 DIAGNOSIS — R262 Difficulty in walking, not elsewhere classified: Secondary | ICD-10-CM | POA: Diagnosis not present

## 2021-06-19 DIAGNOSIS — M5137 Other intervertebral disc degeneration, lumbosacral region: Secondary | ICD-10-CM | POA: Diagnosis not present

## 2021-06-19 DIAGNOSIS — M6281 Muscle weakness (generalized): Secondary | ICD-10-CM | POA: Diagnosis not present

## 2021-06-20 DIAGNOSIS — R262 Difficulty in walking, not elsewhere classified: Secondary | ICD-10-CM | POA: Diagnosis not present

## 2021-06-20 DIAGNOSIS — M6281 Muscle weakness (generalized): Secondary | ICD-10-CM | POA: Diagnosis not present

## 2021-06-20 DIAGNOSIS — M5137 Other intervertebral disc degeneration, lumbosacral region: Secondary | ICD-10-CM | POA: Diagnosis not present

## 2021-06-21 DIAGNOSIS — M6281 Muscle weakness (generalized): Secondary | ICD-10-CM | POA: Diagnosis not present

## 2021-06-21 DIAGNOSIS — M5137 Other intervertebral disc degeneration, lumbosacral region: Secondary | ICD-10-CM | POA: Diagnosis not present

## 2021-06-21 DIAGNOSIS — R262 Difficulty in walking, not elsewhere classified: Secondary | ICD-10-CM | POA: Diagnosis not present

## 2021-06-23 ENCOUNTER — Non-Acute Institutional Stay (SKILLED_NURSING_FACILITY): Payer: Medicare Other | Admitting: Adult Health

## 2021-06-23 ENCOUNTER — Encounter: Payer: Self-pay | Admitting: Adult Health

## 2021-06-23 DIAGNOSIS — E876 Hypokalemia: Secondary | ICD-10-CM

## 2021-06-23 DIAGNOSIS — I700 Atherosclerosis of aorta: Secondary | ICD-10-CM | POA: Diagnosis not present

## 2021-06-23 DIAGNOSIS — I708 Atherosclerosis of other arteries: Secondary | ICD-10-CM | POA: Diagnosis not present

## 2021-06-23 DIAGNOSIS — M5137 Other intervertebral disc degeneration, lumbosacral region: Secondary | ICD-10-CM | POA: Diagnosis not present

## 2021-06-23 DIAGNOSIS — F339 Major depressive disorder, recurrent, unspecified: Secondary | ICD-10-CM | POA: Diagnosis not present

## 2021-06-23 DIAGNOSIS — R262 Difficulty in walking, not elsewhere classified: Secondary | ICD-10-CM | POA: Diagnosis not present

## 2021-06-23 DIAGNOSIS — M6281 Muscle weakness (generalized): Secondary | ICD-10-CM | POA: Diagnosis not present

## 2021-06-23 NOTE — Progress Notes (Signed)
Location:  [facility] Room Number: 144-P Place of Service:  SNF (31)   CODE STATUS: DNR  No Known Allergies  Chief Complaint  Patient presents with   Medical Management of Chronic Issues             Hypokalemia:  Major depression recurrent chronic:  Aortoiliac atherosclerosis     HPI:  She is a 87 year old long term resident of this facility being seen for the management of her chronic illnesses: Hypokalemia:  Major depression recurrent chronic:  Aortoiliac atherosclerosis there are no reports of uncontrolled pain. No changes in appetite; no reports of anxiety or depressive thoughts.   Past Medical History:  Diagnosis Date   Anxiety    Arthritis    Asthma    Cancer ([place])    breast   Depression    Diverticulosis    GERD (gastroesophageal reflux disease)    HTN (hypertension)    Shortness of breath    exertion   Sleep apnea    STOP BANG 4   Thyroid dysfunction     Past Surgical History:  Procedure Laterality Date   ABDOMINAL HYSTERECTOMY     arthroscopic surgery rt knee  1997   BREAST SURGERY  2001   LEFT/ CANCER    broken radius ulna  1990   left   broken right ankle  1976   EXPLORATORY LAPAROTOMY  08/24/2020   with partial sigmoidectomy creat of end colostomy and application of negative pressure dressing.      JOINT REPLACEMENT  right knee 2005 [place]   left knee  2004   rt foot bone spur removed  [place]  01/22/2012   Procedure: TOTAL KNEE ARTHROPLASTY;  Surgeon: Carole Civil, MD;  Location: [facility];  Service: Orthopedics;  Laterality: Left;   TUBAL LIGATION      Social History   Socioeconomic History   Marital status: Widowed    Spouse name: Not on file   Number of children: Not on file   Years of education: Not on file   Highest education level: Not on file  Occupational History   Not on file  Tobacco Use   Smoking status: Former   Smokeless tobacco: Former  Brewing technologist Use: Never used  Substance and Sexual Activity   Alcohol use: No   Drug use: No   Sexual activity: Never  Other Topics Concern   Not on file  Social History Narrative   Not on file   Social Determinants of Health   Financial Resource Strain: Not on file  Food Insecurity: Not on file  Transportation Needs: Not on file  Physical Activity: Not on file  Stress: Not on file  Social Connections: Not on file  Intimate Partner Violence: Not on file   Family History  Problem Relation Age of Onset   Pseudochol deficiency Neg Hx    Malignant hyperthermia Neg Hx    Hypotension Neg Hx    Anesthesia problems Neg Hx       VITAL SIGNS BP (!) 142/86   Pulse 67   Temp 98.2 F (36.8 C)   Resp 18   Ht '4\' 9"'  (1.448 m)   Wt 180 lb 9.6 oz (81.9 kg)   SpO2 99%   BMI 39.08 kg/m   Outpatient Encounter Medications as of 06/23/2021  Medication Sig   acetaminophen (TYLENOL) 500 MG tablet Take 500 mg  by mouth every 8 (eight) hours as needed.   acetaminophen (TYLENOL) 500 MG tablet Take 500 mg by mouth at bedtime.   alendronate (FOSAMAX) 70 MG tablet Take 70 mg by mouth once a week. Every Sunday take with a full glass of water on an empty stomach.   atenolol (TENORMIN) 50 MG tablet Take 50 mg by mouth every evening.   BREO ELLIPTA 200-25 MCG/INH AEPB Inhale 1 puff into the lungs daily.   Calcium Carb-Cholecalciferol (CALCIUM 500 + D PO) Take 1 tablet by mouth daily.   chlorthalidone (HYGROTON) 25 MG tablet Take 25 mg by mouth daily.   cholecalciferol (VITAMIN D) 1000 UNITS tablet Take 2,000 Units by mouth every morning.   Cyanocobalamin 1000 MCG/ML KIT Inject as directed. Once a day on Saturday from 09/04/2020-10/02/2020. Then take one injection once a month starting 11/04/2020   DULoxetine (CYMBALTA) 20 MG capsule Take 40 mg by mouth daily.   loratadine (CLARITIN) 10 MG tablet Take 10 mg by mouth daily.   melatonin 3 MG TABS tablet Take 3 mg by mouth at bedtime.   Multiple  Vitamins-Minerals (CENTRUM PO) Take by mouth.   Multiple Vitamins-Minerals (PRESERVISION AREDS 2) CAPS Take 1 capsule by mouth in the morning and at bedtime.   NON FORMULARY Diet:Regular   nystatin (MYCOSTATIN/NYSTOP) powder Apply 1 application topically daily as needed. Special Instructions: Apply thin layer of Nystatin powder to skin around stoma when changing colostomy bag PRN irritation.   pantoprazole (PROTONIX) 40 MG tablet Take 40 mg by mouth 2 (two) times daily.   potassium chloride SA (KLOR-CON) 20 MEQ tablet Take 40 mEq by mouth 2 (two) times daily.   No facility-administered encounter medications on file as of 06/23/2021.     SIGNIFICANT DIAGNOSTIC EXAMS   PREVIOUS   08-21-20; ct of abdomen and pelvis:  1. Distended and fluid filled obstructed large bowel with focal transition point in the sigmoid colon, raising concern for underlying stricture or mass in this region. No evidence of pneumatosis or perforation.  2. Gastric suction tube tip terminates in the duodenum consider slight retraction  08-21-20: ct of abdomen and pelvis 1. Gallbladder/biliary layering hyperdense material in the gallbladder may reflect vicarious excretion of contrast or sludge  2. Mesentery somewhat tortuous structure that matches blood pool adjacent to the proximal SMA favored to reflect a varicosity or vascular anastomosis 3. GI tract: colonic diverticulosis without evidence of diverticulitis  4. Vascular aortobilliac atherosclerosis  5. MSK; reverse s-shaped scoliotic curvature of the spine. L1 compression fracture status post cement augmentation  Osteopenia and poly articular degenerative changes.   NO NEW LABS.   LABS REVIEWED PREVIOUS  08-19-20 vit B12: 176 08-22-20: wbc 8.2; hgb 10.3; hct 31.4; mcv 101.4 plt 151 glucose 58; bun 10 creat 0.25; k+ 4.3; na++ 132 ca 7.4 mag 1.0 phos 1.2  08-23-20: glucose 100 bun 15; creat 0.50 k+ 4.3 na++ 133; ca 8.8  mag 2.8  08-31-20: wbc 6.0; hgb 10.7; hct  32.1; mcv 102.0 plt 288; glucose 153; bun 6; creat 0.42; k+ 3.4; na++ 134  09-06-20: wbc 6.3; hgb 11.4 hct 36.2 mcv 104.9; plt 340; glucose 98 bun 10; creat 0.61; k+ 3.6; na++ 133; ca 9.2 liver normal albumin 2.6 09-20-20: wbc 7.0; hgb 13.9; hct 43.1; mcv 103.1 plt 332 01-06-21: wb 6.6; hgb 14.5; hct 44.2; mcv 102.1 plt 257; glucose 117; bun 28; creat 0.63; k+ 3.1; na++ 138; ca 9.5 GFR>60; live normal albumin 3.6 02-10-21: glucose 89; bun 27; creat 0.66; k+  2.8; na++ 138; ca 9.2 GFR>60 02-11-21: k+ 4.6  04-06-21: wbc 6.8; hgb 13.9; hct 42.2; mcv 104.5 plt 228; glucose 125; bun 25; creat 0.70; k+ 3.6; na++ 137; ca 9.1; GFR>60; d-dimer 0.55; CRP 2.3   NO NEW LABS.    Review of Systems  Unable to perform ROS: Dementia (unable to participate)   Physical Exam Constitutional:      General: She is not in acute distress.    Appearance: She is well-developed. She is not diaphoretic.  Neck:     Thyroid: No thyromegaly.  Cardiovascular:     Rate and Rhythm: Normal rate and regular rhythm.     Pulses: Normal pulses.     Heart sounds: Normal heart sounds.  Pulmonary:     Effort: Pulmonary effort is normal. No respiratory distress.     Breath sounds: Normal breath sounds.  Abdominal:     General: Bowel sounds are normal. There is no distension.     Palpations: Abdomen is soft.     Tenderness: There is no abdominal tenderness.     Comments: colostomy  Musculoskeletal:        General: Normal range of motion.     Cervical back: Neck supple.     Right lower leg: No edema.     Left lower leg: No edema.     Comments: Scoliosis   Lymphadenopathy:     Cervical: No cervical adenopathy.  Skin:    General: Skin is warm and dry.  Neurological:     Mental Status: She is alert. Mental status is at baseline.  Psychiatric:        Mood and Affect: Mood normal.     ASSESSMENT/ PLAN:  TODAY  Hypokalemia: is stable k+ 3.6 will continue k+ 40 meq  twice daily   2. Major depression recurrent chronic:  is stable will continue cymbalta 40 mg daily melatonin 3 mg nightly for sleep is off ativan  3. Aortoiliac atherosclerosis (ct 08-21-20) will monitor   PREVIOUS    4. Large bowel obstruction/status post exploratory lap/status post colostomy will continue to monitor   5. Degenerative disc disease lumbar: is stable will continue lyrica 75 mg nightly cymbalta 60 mg daily   6. Moderate persistent asthma in adult: is stable will continue breo ellipta 200/25 mcg 1 puff daily   7. Non-seasonal allergic rhinitis unspecified trigger: is without change: will continue claritin 10 mg daily flonase daily and will complete mucinex will monitor her status.   8. GERD without esophagitis: is stable will continue protonix 40 mg twice daily   9. Essential hypertension: is stable b/p 142/86will continue tenormin 50 mg daily and hygroton 25 mg daily       Ok Edwards NP [facility]  Contact [phone] Monday through Friday 8am- 5pm  After hours call [phone]

## 2021-06-24 DIAGNOSIS — R262 Difficulty in walking, not elsewhere classified: Secondary | ICD-10-CM | POA: Diagnosis not present

## 2021-06-24 DIAGNOSIS — M5137 Other intervertebral disc degeneration, lumbosacral region: Secondary | ICD-10-CM | POA: Diagnosis not present

## 2021-06-24 DIAGNOSIS — M6281 Muscle weakness (generalized): Secondary | ICD-10-CM | POA: Diagnosis not present

## 2021-06-27 ENCOUNTER — Encounter: Payer: Self-pay | Admitting: Adult Health

## 2021-06-27 ENCOUNTER — Non-Acute Institutional Stay (SKILLED_NURSING_FACILITY): Payer: Medicare Other | Admitting: Adult Health

## 2021-06-27 DIAGNOSIS — J0110 Acute frontal sinusitis, unspecified: Secondary | ICD-10-CM

## 2021-06-27 NOTE — Progress Notes (Signed)
Location:  [facility] Room Number: 144-P Place of Service:  SNF (31)   CODE STATUS: DNR  No Known Allergies  Chief Complaint  Patient presents with   Acute Visit    Sore Throat    HPI:  She is complaining of a sore throat for about "one week". She denies any fevers. She does have a cough with sputum production. She does have facial pain as well.   Past Medical History:  Diagnosis Date   Anxiety    Arthritis    Asthma    Cancer ([place])    breast   Depression    Diverticulosis    GERD (gastroesophageal reflux disease)    HTN (hypertension)    Shortness of breath    exertion   Sleep apnea    STOP BANG 4   Thyroid dysfunction     Past Surgical History:  Procedure Laterality Date   ABDOMINAL HYSTERECTOMY     arthroscopic surgery rt knee  1997   BREAST SURGERY  2001   LEFT/ CANCER    broken radius ulna  1990   left   broken right ankle  1976   EXPLORATORY LAPAROTOMY  08/24/2020   with partial sigmoidectomy creat of end colostomy and application of negative pressure dressing.      JOINT REPLACEMENT  right knee 2005 [facility]   left knee  2004   rt foot bone spur removed  [place]  01/22/2012   Procedure: TOTAL KNEE ARTHROPLASTY;  Surgeon: Carole Civil, MD;  Location: [facility];  Service: Orthopedics;  Laterality: Left;   TUBAL LIGATION      Social History   Socioeconomic History   Marital status: Widowed    Spouse name: Not on file   Number of children: Not on file   Years of education: Not on file   Highest education level: Not on file  Occupational History   Not on file  Tobacco Use   Smoking status: Former   Smokeless tobacco: Former  Scientific laboratory technician Use: Never used  Substance and Sexual Activity   Alcohol use: No   Drug use: No   Sexual activity: Never  Other Topics Concern   Not on file  Social History Narrative   Not on file   Social Determinants of Health    Financial Resource Strain: Not on file  Food Insecurity: Not on file  Transportation Needs: Not on file  Physical Activity: Not on file  Stress: Not on file  Social Connections: Not on file  Intimate Partner Violence: Not on file   Family History  Problem Relation Age of Onset   Pseudochol deficiency Neg Hx    Malignant hyperthermia Neg Hx    Hypotension Neg Hx    Anesthesia problems Neg Hx       VITAL SIGNS BP 128/64   Pulse 66   Temp 97.7 F (36.5 C)   Resp 18   Ht 4' 9" (1.448 m)   Wt 180 lb 9.6 oz (81.9 kg)   SpO2 99%   BMI 39.08 kg/m   Outpatient Encounter Medications as of 06/27/2021  Medication Sig   acetaminophen (TYLENOL) 500 MG tablet Take 500 mg by mouth every 8 (eight) hours as needed.   acetaminophen (TYLENOL) 500 MG tablet Take 500 mg by mouth at bedtime.   alendronate (FOSAMAX) 70 MG tablet Take 70 mg by mouth once a week. Every Sunday  take with a full glass of water on an empty stomach.   atenolol (TENORMIN) 50 MG tablet Take 50 mg by mouth every evening.   BREO ELLIPTA 200-25 MCG/INH AEPB Inhale 1 puff into the lungs daily.   Calcium Carb-Cholecalciferol (CALCIUM 500 + D PO) Take 1 tablet by mouth daily.   chlorthalidone (HYGROTON) 25 MG tablet Take 25 mg by mouth daily.   cholecalciferol (VITAMIN D) 1000 UNITS tablet Take 2,000 Units by mouth every morning.   Cyanocobalamin 1000 MCG/ML KIT Inject as directed. Once a day on Saturday from 09/04/2020-10/02/2020. Then take one injection once a month starting 11/04/2020   DULoxetine (CYMBALTA) 20 MG capsule Take 40 mg by mouth daily.   loratadine (CLARITIN) 10 MG tablet Take 10 mg by mouth daily.   melatonin 3 MG TABS tablet Take 3 mg by mouth at bedtime.   Multiple Vitamins-Minerals (CENTRUM PO) Take by mouth.   Multiple Vitamins-Minerals (PRESERVISION AREDS 2) CAPS Take 1 capsule by mouth in the morning and at bedtime.   NON FORMULARY Diet:Regular   nystatin (MYCOSTATIN/NYSTOP) powder Apply 1 application  topically daily as needed. Special Instructions: Apply thin layer of Nystatin powder to skin around stoma when changing colostomy bag PRN irritation.   pantoprazole (PROTONIX) 40 MG tablet Take 40 mg by mouth 2 (two) times daily.   potassium chloride SA (KLOR-CON) 20 MEQ tablet Take 40 mEq by mouth 2 (two) times daily.   No facility-administered encounter medications on file as of 06/27/2021.     SIGNIFICANT DIAGNOSTIC EXAMS  PREVIOUS   08-21-20; ct of abdomen and pelvis:  1. Distended and fluid filled obstructed large bowel with focal transition point in the sigmoid colon, raising concern for underlying stricture or mass in this region. No evidence of pneumatosis or perforation.  2. Gastric suction tube tip terminates in the duodenum consider slight retraction  08-21-20: ct of abdomen and pelvis 1. Gallbladder/biliary layering hyperdense material in the gallbladder may reflect vicarious excretion of contrast or sludge  2. Mesentery somewhat tortuous structure that matches blood pool adjacent to the proximal SMA favored to reflect a varicosity or vascular anastomosis 3. GI tract: colonic diverticulosis without evidence of diverticulitis  4. Vascular aortobilliac atherosclerosis  5. MSK; reverse s-shaped scoliotic curvature of the spine. L1 compression fracture status post cement augmentation  Osteopenia and poly articular degenerative changes.   NO NEW LABS.   LABS REVIEWED PREVIOUS  08-19-20 vit B12: 176 08-22-20: wbc 8.2; hgb 10.3; hct 31.4; mcv 101.4 plt 151 glucose 58; bun 10 creat 0.25; k+ 4.3; na++ 132 ca 7.4 mag 1.0 phos 1.2  08-23-20: glucose 100 bun 15; creat 0.50 k+ 4.3 na++ 133; ca 8.8  mag 2.8  08-31-20: wbc 6.0; hgb 10.7; hct 32.1; mcv 102.0 plt 288; glucose 153; bun 6; creat 0.42; k+ 3.4; na++ 134  09-06-20: wbc 6.3; hgb 11.4 hct 36.2 mcv 104.9; plt 340; glucose 98 bun 10; creat 0.61; k+ 3.6; na++ 133; ca 9.2 liver normal albumin 2.6 09-20-20: wbc 7.0; hgb 13.9; hct 43.1;  mcv 103.1 plt 332 01-06-21: wb 6.6; hgb 14.5; hct 44.2; mcv 102.1 plt 257; glucose 117; bun 28; creat 0.63; k+ 3.1; na++ 138; ca 9.5 GFR>60; live normal albumin 3.6 02-10-21: glucose 89; bun 27; creat 0.66; k+ 2.8; na++ 138; ca 9.2 GFR>60 02-11-21: k+ 4.6  04-06-21: wbc 6.8; hgb 13.9; hct 42.2; mcv 104.5 plt 228; glucose 125; bun 25; creat 0.70; k+ 3.6; na++ 137; ca 9.1; GFR>60; d-dimer 0.55; CRP 2.3  NO NEW LABS.    Review of Systems  Constitutional:  Negative for malaise/fatigue.  HENT:  Positive for congestion and sore throat.   Respiratory:  Positive for cough and sputum production. Negative for shortness of breath.   Cardiovascular:  Negative for chest pain, palpitations and leg swelling.  Gastrointestinal:  Negative for abdominal pain, constipation and heartburn.  Musculoskeletal:  Negative for back pain, joint pain and myalgias.  Skin: Negative.   Neurological:  Negative for dizziness.  Psychiatric/Behavioral:  The patient is not nervous/anxious.    Physical Exam Constitutional:      General: She is not in acute distress.    Appearance: She is well-developed. She is not diaphoretic.  Neck:     Thyroid: No thyromegaly.  Cardiovascular:     Rate and Rhythm: Normal rate and regular rhythm.     Pulses: Normal pulses.     Heart sounds: Normal heart sounds.  Pulmonary:     Effort: Pulmonary effort is normal. No respiratory distress.     Breath sounds: Normal breath sounds.  Abdominal:     General: Bowel sounds are normal. There is no distension.     Palpations: Abdomen is soft.     Tenderness: There is no abdominal tenderness.     Comments: Colostomy   Musculoskeletal:        General: Normal range of motion.     Cervical back: Neck supple.     Right lower leg: No edema.     Left lower leg: No edema.  Lymphadenopathy:     Cervical: No cervical adenopathy.  Skin:    General: Skin is warm and dry.  Neurological:     Mental Status: She is alert. Mental status is at baseline.   Psychiatric:        Mood and Affect: Mood normal.      ASSESSMENT/ PLAN:  TODAY  Acute sinusitis: will begin z-pack and will monitor her status.    Ok Edwards NP [facility]  Contact [phone] Monday through Friday 8am- 5pm  After hours call [phone]

## 2021-06-28 DIAGNOSIS — M6281 Muscle weakness (generalized): Secondary | ICD-10-CM | POA: Diagnosis not present

## 2021-06-28 DIAGNOSIS — R262 Difficulty in walking, not elsewhere classified: Secondary | ICD-10-CM | POA: Diagnosis not present

## 2021-06-28 DIAGNOSIS — M5137 Other intervertebral disc degeneration, lumbosacral region: Secondary | ICD-10-CM | POA: Diagnosis not present

## 2021-06-29 DIAGNOSIS — M6281 Muscle weakness (generalized): Secondary | ICD-10-CM | POA: Diagnosis not present

## 2021-06-29 DIAGNOSIS — R262 Difficulty in walking, not elsewhere classified: Secondary | ICD-10-CM | POA: Diagnosis not present

## 2021-06-29 DIAGNOSIS — M5137 Other intervertebral disc degeneration, lumbosacral region: Secondary | ICD-10-CM | POA: Diagnosis not present

## 2021-06-29 DIAGNOSIS — Z23 Encounter for immunization: Secondary | ICD-10-CM | POA: Diagnosis not present

## 2021-06-30 DIAGNOSIS — M6281 Muscle weakness (generalized): Secondary | ICD-10-CM | POA: Diagnosis not present

## 2021-06-30 DIAGNOSIS — M5137 Other intervertebral disc degeneration, lumbosacral region: Secondary | ICD-10-CM | POA: Diagnosis not present

## 2021-06-30 DIAGNOSIS — R262 Difficulty in walking, not elsewhere classified: Secondary | ICD-10-CM | POA: Diagnosis not present

## 2021-07-01 DIAGNOSIS — M5137 Other intervertebral disc degeneration, lumbosacral region: Secondary | ICD-10-CM | POA: Diagnosis not present

## 2021-07-01 DIAGNOSIS — R262 Difficulty in walking, not elsewhere classified: Secondary | ICD-10-CM | POA: Diagnosis not present

## 2021-07-01 DIAGNOSIS — M6281 Muscle weakness (generalized): Secondary | ICD-10-CM | POA: Diagnosis not present

## 2021-07-02 DIAGNOSIS — M6281 Muscle weakness (generalized): Secondary | ICD-10-CM | POA: Diagnosis not present

## 2021-07-02 DIAGNOSIS — M5137 Other intervertebral disc degeneration, lumbosacral region: Secondary | ICD-10-CM | POA: Diagnosis not present

## 2021-07-02 DIAGNOSIS — R262 Difficulty in walking, not elsewhere classified: Secondary | ICD-10-CM | POA: Diagnosis not present

## 2021-07-04 DIAGNOSIS — M6281 Muscle weakness (generalized): Secondary | ICD-10-CM | POA: Diagnosis not present

## 2021-07-04 DIAGNOSIS — R262 Difficulty in walking, not elsewhere classified: Secondary | ICD-10-CM | POA: Diagnosis not present

## 2021-07-04 DIAGNOSIS — M5137 Other intervertebral disc degeneration, lumbosacral region: Secondary | ICD-10-CM | POA: Diagnosis not present

## 2021-07-05 DIAGNOSIS — M6281 Muscle weakness (generalized): Secondary | ICD-10-CM | POA: Diagnosis not present

## 2021-07-05 DIAGNOSIS — R262 Difficulty in walking, not elsewhere classified: Secondary | ICD-10-CM | POA: Diagnosis not present

## 2021-07-05 DIAGNOSIS — M5137 Other intervertebral disc degeneration, lumbosacral region: Secondary | ICD-10-CM | POA: Diagnosis not present

## 2021-07-06 DIAGNOSIS — M5137 Other intervertebral disc degeneration, lumbosacral region: Secondary | ICD-10-CM | POA: Diagnosis not present

## 2021-07-06 DIAGNOSIS — M6281 Muscle weakness (generalized): Secondary | ICD-10-CM | POA: Diagnosis not present

## 2021-07-06 DIAGNOSIS — R262 Difficulty in walking, not elsewhere classified: Secondary | ICD-10-CM | POA: Diagnosis not present

## 2021-07-07 DIAGNOSIS — M6281 Muscle weakness (generalized): Secondary | ICD-10-CM | POA: Diagnosis not present

## 2021-07-07 DIAGNOSIS — M5137 Other intervertebral disc degeneration, lumbosacral region: Secondary | ICD-10-CM | POA: Diagnosis not present

## 2021-07-07 DIAGNOSIS — R262 Difficulty in walking, not elsewhere classified: Secondary | ICD-10-CM | POA: Diagnosis not present

## 2021-07-08 DIAGNOSIS — M6281 Muscle weakness (generalized): Secondary | ICD-10-CM | POA: Diagnosis not present

## 2021-07-08 DIAGNOSIS — R262 Difficulty in walking, not elsewhere classified: Secondary | ICD-10-CM | POA: Diagnosis not present

## 2021-07-08 DIAGNOSIS — M5137 Other intervertebral disc degeneration, lumbosacral region: Secondary | ICD-10-CM | POA: Diagnosis not present

## 2021-07-11 DIAGNOSIS — M5137 Other intervertebral disc degeneration, lumbosacral region: Secondary | ICD-10-CM | POA: Diagnosis not present

## 2021-07-11 DIAGNOSIS — M6281 Muscle weakness (generalized): Secondary | ICD-10-CM | POA: Diagnosis not present

## 2021-07-11 DIAGNOSIS — R262 Difficulty in walking, not elsewhere classified: Secondary | ICD-10-CM | POA: Diagnosis not present

## 2021-07-12 DIAGNOSIS — R262 Difficulty in walking, not elsewhere classified: Secondary | ICD-10-CM | POA: Diagnosis not present

## 2021-07-12 DIAGNOSIS — M5137 Other intervertebral disc degeneration, lumbosacral region: Secondary | ICD-10-CM | POA: Diagnosis not present

## 2021-07-12 DIAGNOSIS — M6281 Muscle weakness (generalized): Secondary | ICD-10-CM | POA: Diagnosis not present

## 2021-07-20 DIAGNOSIS — K56699 Other intestinal obstruction unspecified as to partial versus complete obstruction: Secondary | ICD-10-CM | POA: Diagnosis not present

## 2021-07-20 DIAGNOSIS — Z1159 Encounter for screening for other viral diseases: Secondary | ICD-10-CM | POA: Diagnosis not present

## 2021-07-22 ENCOUNTER — Non-Acute Institutional Stay (SKILLED_NURSING_FACILITY): Payer: Medicare Other | Admitting: Adult Health

## 2021-07-22 ENCOUNTER — Encounter: Payer: Self-pay | Admitting: Adult Health

## 2021-07-22 DIAGNOSIS — M47816 Spondylosis without myelopathy or radiculopathy, lumbar region: Secondary | ICD-10-CM | POA: Diagnosis not present

## 2021-07-22 DIAGNOSIS — Z933 Colostomy status: Secondary | ICD-10-CM

## 2021-07-22 DIAGNOSIS — K56609 Unspecified intestinal obstruction, unspecified as to partial versus complete obstruction: Secondary | ICD-10-CM | POA: Diagnosis not present

## 2021-07-22 DIAGNOSIS — J4540 Moderate persistent asthma, uncomplicated: Secondary | ICD-10-CM

## 2021-07-22 NOTE — Progress Notes (Signed)
Location:  [facility] Room Number: 144 Place of Service:  SNF (31) Provider: Nyoka Cowden, Mykell Rawl,NP  CODE STATUS: DNR  No Known Allergies  Chief Complaint  Patient presents with   Medical Management of Chronic Issues           Large bowel obstruction/status post exploratory lap/ status post colostomy;  Degenerative disc disease lumbar:  Moderate persistent asthma in adult    HPI:  She is a 87 year old long term resident of this facility being seen for the management of her chronic illnesses: Large bowel obstruction/status post exploratory lap/ status post colostomy;  Degenerative disc disease lumbar:  Moderate persistent asthma in adult. There are no reports of uncontrolled pain. No changes in appetite; weight is stable. There are no reports of cough or shortness of breath.   Past Medical History:  Diagnosis Date   Anxiety    Arthritis    Asthma    Cancer ([place])    breast   Depression    Diverticulosis    GERD (gastroesophageal reflux disease)    HTN (hypertension)    Shortness of breath    exertion   Sleep apnea    STOP BANG 4   Thyroid dysfunction     Past Surgical History:  Procedure Laterality Date   ABDOMINAL HYSTERECTOMY     arthroscopic surgery rt knee  1997   BREAST SURGERY  2001   LEFT/ CANCER    broken radius ulna  1990   left   broken right ankle  1976   EXPLORATORY LAPAROTOMY  08/24/2020   with partial sigmoidectomy creat of end colostomy and application of negative pressure dressing.      JOINT REPLACEMENT  right knee 2005 [facility]   left knee  2004   rt foot bone spur removed  [place]  01/22/2012   Procedure: TOTAL KNEE ARTHROPLASTY;  Surgeon: Carole Civil, MD;  Location: [facility];  Service: Orthopedics;  Laterality: Left;   TUBAL LIGATION      Social History   Socioeconomic History   Marital status: Widowed    Spouse name: Not on file   Number of children: Not on file    Years of education: Not on file   Highest education level: Not on file  Occupational History   Not on file  Tobacco Use   Smoking status: Former   Smokeless tobacco: Former  Scientific laboratory technician Use: Never used  Substance and Sexual Activity   Alcohol use: No   Drug use: No   Sexual activity: Never  Other Topics Concern   Not on file  Social History Narrative   Not on file   Social Determinants of Health   Financial Resource Strain: Not on file  Food Insecurity: Not on file  Transportation Needs: Not on file  Physical Activity: Not on file  Stress: Not on file  Social Connections: Not on file  Intimate Partner Violence: Not on file   Family History  Problem Relation Age of Onset   Pseudochol deficiency Neg Hx    Malignant hyperthermia Neg Hx    Hypotension Neg Hx    Anesthesia problems Neg Hx       VITAL SIGNS BP 130/70   Pulse 62   Temp 98.1 F (36.7 C)   Resp 18   Ht '4\' 9"'  (1.448 m)   Wt 181 lb 12.8 oz (82.5 kg)   SpO2 99%  BMI 39.34 kg/m   Outpatient Encounter Medications as of 07/22/2021  Medication Sig   acetaminophen (TYLENOL) 500 MG tablet Take 500 mg by mouth every 8 (eight) hours as needed.   acetaminophen (TYLENOL) 500 MG tablet Take 500 mg by mouth at bedtime.   alendronate (FOSAMAX) 70 MG tablet Take 70 mg by mouth once a week. Every Sunday take with a full glass of water on an empty stomach.   atenolol (TENORMIN) 50 MG tablet Take 50 mg by mouth every evening.   BREO ELLIPTA 200-25 MCG/INH AEPB Inhale 1 puff into the lungs daily.   Calcium Carb-Cholecalciferol (CALCIUM 500 + D PO) Take 1 tablet by mouth daily.   chlorthalidone (HYGROTON) 25 MG tablet Take 25 mg by mouth daily.   cholecalciferol (VITAMIN D) 1000 UNITS tablet Take 2,000 Units by mouth every morning.   Cyanocobalamin 1000 MCG/ML KIT Inject as directed. Once a day on Saturday from 09/04/2020-10/02/2020. Then take one injection once a month starting 11/04/2020   DULoxetine (CYMBALTA)  20 MG capsule Take 40 mg by mouth daily.   loratadine (CLARITIN) 10 MG tablet Take 10 mg by mouth daily.   melatonin 3 MG TABS tablet Take 3 mg by mouth at bedtime.   Multiple Vitamins-Minerals (PRESERVISION AREDS 2) CAPS Take 1 capsule by mouth in the morning and at bedtime.   NON FORMULARY Diet:Regular   nystatin (MYCOSTATIN/NYSTOP) powder Apply 1 application topically daily as needed. Special Instructions: Apply thin layer of Nystatin powder to skin around stoma when changing colostomy bag PRN irritation.   pantoprazole (PROTONIX) 40 MG tablet Take 40 mg by mouth 2 (two) times daily.   potassium chloride SA (KLOR-CON) 20 MEQ tablet Take 40 mEq by mouth 2 (two) times daily.   [DISCONTINUED] Multiple Vitamins-Minerals (CENTRUM PO) Take by mouth.   No facility-administered encounter medications on file as of 07/22/2021.     SIGNIFICANT DIAGNOSTIC EXAMS  PREVIOUS   08-21-20; ct of abdomen and pelvis:  1. Distended and fluid filled obstructed large bowel with focal transition point in the sigmoid colon, raising concern for underlying stricture or mass in this region. No evidence of pneumatosis or perforation.  2. Gastric suction tube tip terminates in the duodenum consider slight retraction  08-21-20: ct of abdomen and pelvis 1. Gallbladder/biliary layering hyperdense material in the gallbladder may reflect vicarious excretion of contrast or sludge  2. Mesentery somewhat tortuous structure that matches blood pool adjacent to the proximal SMA favored to reflect a varicosity or vascular anastomosis 3. GI tract: colonic diverticulosis without evidence of diverticulitis  4. Vascular aortobilliac atherosclerosis  5. MSK; reverse s-shaped scoliotic curvature of the spine. L1 compression fracture status post cement augmentation  Osteopenia and poly articular degenerative changes.   NO NEW LABS.   LABS REVIEWED PREVIOUS  08-19-20 vit B12: 176 08-22-20: wbc 8.2; hgb 10.3; hct 31.4; mcv 101.4  plt 151 glucose 58; bun 10 creat 0.25; k+ 4.3; na++ 132 ca 7.4 mag 1.0 phos 1.2  08-23-20: glucose 100 bun 15; creat 0.50 k+ 4.3 na++ 133; ca 8.8  mag 2.8  08-31-20: wbc 6.0; hgb 10.7; hct 32.1; mcv 102.0 plt 288; glucose 153; bun 6; creat 0.42; k+ 3.4; na++ 134  09-06-20: wbc 6.3; hgb 11.4 hct 36.2 mcv 104.9; plt 340; glucose 98 bun 10; creat 0.61; k+ 3.6; na++ 133; ca 9.2 liver normal albumin 2.6 09-20-20: wbc 7.0; hgb 13.9; hct 43.1; mcv 103.1 plt 332 01-06-21: wb 6.6; hgb 14.5; hct 44.2; mcv 102.1 plt 257; glucose  117; bun 28; creat 0.63; k+ 3.1; na++ 138; ca 9.5 GFR>60; live normal albumin 3.6 02-10-21: glucose 89; bun 27; creat 0.66; k+ 2.8; na++ 138; ca 9.2 GFR>60 02-11-21: k+ 4.6  04-07-21: wbc 6.8; hgb 13.9; hct 42.2; mcv 104.5 plt 228; glucose 125; bun 25; creat 0.70; k+ 3.6; na++ 137; ca 9.1; GFR>60; d-dimer 0.55; CRP 2.3   TODAY  06-02-21: vitamin B12: 447 folate 29.1    Review of Systems  Unable to perform ROS: Dementia (unable to participate)    Physical Exam Constitutional:      General: She is not in acute distress.    Appearance: She is well-developed. She is not diaphoretic.  Neck:     Thyroid: No thyromegaly.  Cardiovascular:     Rate and Rhythm: Normal rate and regular rhythm.     Pulses: Normal pulses.     Heart sounds: Normal heart sounds.  Pulmonary:     Effort: Pulmonary effort is normal. No respiratory distress.     Breath sounds: Normal breath sounds.  Abdominal:     General: Bowel sounds are normal. There is no distension.     Palpations: Abdomen is soft.     Tenderness: There is no abdominal tenderness.     Comments: Colostomy   Musculoskeletal:        General: Normal range of motion.     Cervical back: Neck supple.     Right lower leg: No edema.     Left lower leg: No edema.     Comments: Scoliosis   Lymphadenopathy:     Cervical: No cervical adenopathy.  Skin:    General: Skin is warm and dry.  Neurological:     Mental Status: She is alert.  Mental status is at baseline.  Psychiatric:        Mood and Affect: Mood normal.     ASSESSMENT/ PLAN:  TODAY  Large bowel obstruction/status post exploratory lap/ status post colostomy; will monitor  2. Degenerative disc disease lumbar: is stable will continue cymbalta 40 mg daily   3. Moderate persistent asthma in adult: is stable will continue breo ellipta 200/25 mcg 1 puff daily   PREVIOUS    4. Non-seasonal allergic rhinitis unspecified trigger: is without change: will continue claritin 10 mg daily flonase daily and will complete mucinex will monitor her status.   5. GERD without esophagitis: is stable will continue protonix 40 mg twice daily   6. Essential hypertension: is stable b/p 130/70 will continue tenormin 50 mg daily and hygroton 25 mg daily   7. Hypokalemia: is stable k+ 3.6 will continue k+ 40 meq  twice daily   8. Major depression recurrent chronic: is stable will continue cymbalta 40 mg daily melatonin 3 mg nightly for sleep is off ativan  9. Aortoiliac atherosclerosis (ct 08-21-20) will monitor       Ok Edwards NP [facility]  Contact [phone] Monday through Friday 8am- 5pm  After hours call [phone]

## 2021-07-27 DIAGNOSIS — M47816 Spondylosis without myelopathy or radiculopathy, lumbar region: Secondary | ICD-10-CM | POA: Insufficient documentation

## 2021-08-08 ENCOUNTER — Inpatient Hospital Stay (HOSPITAL_COMMUNITY)
Admit: 2021-08-08 | Discharge: 2021-08-08 | Disposition: A | Discharge status: Home / Self Care | Payer: Medicare Other | Attending: Adult Health | Admitting: Adult Health

## 2021-08-08 DIAGNOSIS — M85831 Other specified disorders of bone density and structure, right forearm: Secondary | ICD-10-CM | POA: Diagnosis not present

## 2021-08-08 DIAGNOSIS — M85851 Other specified disorders of bone density and structure, right thigh: Secondary | ICD-10-CM | POA: Diagnosis not present

## 2021-08-17 ENCOUNTER — Non-Acute Institutional Stay (SKILLED_NURSING_FACILITY): Payer: Medicare Other | Admitting: Internal Medicine

## 2021-08-17 ENCOUNTER — Encounter: Payer: Self-pay | Admitting: Internal Medicine

## 2021-08-17 DIAGNOSIS — D539 Nutritional anemia, unspecified: Secondary | ICD-10-CM

## 2021-08-17 DIAGNOSIS — I10 Essential (primary) hypertension: Secondary | ICD-10-CM | POA: Diagnosis not present

## 2021-08-17 DIAGNOSIS — N182 Chronic kidney disease, stage 2 (mild): Secondary | ICD-10-CM

## 2021-08-17 NOTE — Progress Notes (Signed)
NURSING HOME LOCATION:  [facility] ROOM NUMBER:  144 P  CODE STATUS:  DNR  PCP:  Caryl Bis MD  This is a nursing facility follow up visit of chronic medical diagnoses & to document compliance with Regulation 483.30 (c) in The [place] Manual Phase 2 which mandates caregiver visit ( visits can alternate among physician, PA or NP as per statutes) within 10 days of 30 days / 60 days/ 90 days post admission to SNF date    Interim medical record and care since last SNF visit was updated with review of diagnostic studies and change in clinical status since last visit were documented.  HPI: She is a permanent resident of facility with diagnoses of history of asthma, history of breast cancer, depression, diverticulosis, GERD, essential hypertension, thyroid disorder, and sleep apnea.  Most recent labs were reviewed.  Creatinine is 0.70 with a GFR greater than 60 indicating CKD stage II.  This has been stable serially.  There is no anemia but the MCV was 104.5.  Both folate and B12 levels are normal. She had COVID in July; C-reactive protein was mildly elevated at 2.3 and D-dimer was minimally elevated at 0.55.  Review of systems: She denies significant health issues beyond arthritis in her knees.  She states he must use a walker for ambulation.  She is profoundly hard of hearing which did  hinder completion of the review of systems.  Constitutional: No fever, significant weight change, fatigue  Eyes: No redness, discharge, pain, vision change ENT/mouth: No nasal congestion,  purulent discharge, earache, change in hearing, sore throat  Cardiovascular: No chest pain, palpitations, paroxysmal nocturnal dyspnea, claudication, edema  Respiratory: No cough, sputum production, hemoptysis, DOE, significant snoring, apnea   Gastrointestinal: No heartburn, dysphagia, abdominal pain, nausea /vomiting, rectal bleeding, melena, change in bowels Genitourinary: No dysuria,  hematuria, pyuria, incontinence, nocturia Dermatologic: No rash, pruritus, change in appearance of skin Neurologic: No dizziness, headache, syncope, seizures, numbness, tingling Psychiatric: No significant anxiety, depression, insomnia, anorexia Endocrine: No change in hair/skin/nails, excessive thirst, excessive hunger, excessive urination  Hematologic/lymphatic: No significant bruising, lymphadenopathy, abnormal bleeding Allergy/immunology: No itchy/watery eyes, significant sneezing, urticaria, angioedema  Physical exam:  Pertinent or positive findings: As noted she is profoundly hard of hearing.  When I entered the room she was asleep in her wheelchair.  She has bilateral ptosis.  She has complete dentures.  She is morbidly obese.  Heart sounds are distant.  She has minor lower lobe rales.  Abdomen is markedly protuberant. Ostomy present. Pedal pulses were not palpable.  She has 1/2+ edema at the sock line.  There is faint erythema over the right shin without frank cellulitis.  There is a central healed lesion in that area.  General appearance: Adequately nourished; no acute distress, increased work of breathing is present.   Lymphatic: No lymphadenopathy about the head, neck, axilla. Eyes: No conjunctival inflammation or lid edema is present. There is no scleral icterus. Ears:  External ear exam shows no significant lesions or deformities.   Nose:  External nasal examination shows no deformity or inflammation. Nasal mucosa are pink and moist without lesions, exudates Oral exam: There is no oropharyngeal erythema or exudate. Neck:  No thyromegaly, masses, tenderness noted.    Heart:  No gallop, murmur, click, rub .  Lungs:  without wheezes, rhonchi, rubs. Abdomen: Bowel sounds are normal. Abdomen is soft and nontender with no organomegaly, hernias, masses. GU: Deferred  Extremities:  No cyanosis,  clubbing Neurologic exam :Balance, Rhomberg, finger to nose testing could not be completed due  to clinical state Skin: Warm & dry w/o tenting.  See summary under each active problem in the Problem List with associated updated therapeutic plan

## 2021-08-18 DIAGNOSIS — N182 Chronic kidney disease, stage 2 (mild): Secondary | ICD-10-CM | POA: Insufficient documentation

## 2021-08-18 NOTE — Assessment & Plan Note (Signed)
BP controlled; no change in antihypertensive medications  

## 2021-08-18 NOTE — Patient Instructions (Signed)
See assessment and plan under each diagnosis in the problem list and acutely for this visit 

## 2021-08-18 NOTE — Assessment & Plan Note (Signed)
Macrocytosis persists but anemia resolved. B12 & folate levels WNL.

## 2021-08-18 NOTE — Assessment & Plan Note (Signed)
GFR > 60, creatinine 0.70.  Medication List reviewed. No nephrotoxic agents identified.

## 2021-09-06 DIAGNOSIS — B351 Tinea unguium: Secondary | ICD-10-CM | POA: Diagnosis not present

## 2021-09-06 DIAGNOSIS — L603 Nail dystrophy: Secondary | ICD-10-CM | POA: Diagnosis not present

## 2021-09-06 DIAGNOSIS — I739 Peripheral vascular disease, unspecified: Secondary | ICD-10-CM | POA: Diagnosis not present

## 2021-09-09 ENCOUNTER — Encounter: Payer: Self-pay | Admitting: Adult Health

## 2021-09-09 ENCOUNTER — Non-Acute Institutional Stay (SKILLED_NURSING_FACILITY): Payer: Medicare Other | Admitting: Adult Health

## 2021-09-09 DIAGNOSIS — F339 Major depressive disorder, recurrent, unspecified: Secondary | ICD-10-CM | POA: Diagnosis not present

## 2021-09-09 DIAGNOSIS — Z933 Colostomy status: Secondary | ICD-10-CM

## 2021-09-09 DIAGNOSIS — I708 Atherosclerosis of other arteries: Secondary | ICD-10-CM

## 2021-09-09 DIAGNOSIS — I700 Atherosclerosis of aorta: Secondary | ICD-10-CM

## 2021-09-09 NOTE — Progress Notes (Signed)
Location:  [facility] Room Number: 144-P Place of Service:  SNF (31)   CODE STATUS: DNR  No Known Allergies  Chief Complaint  Patient presents with   Acute Visit    Care plan meeting    HPI:  We have come together for her care plan meeting. BIMS 13/15 mood 0/30. She requires extensive assist with her adls. Frequently incontinent of bladder; has colostomy. No falls. Dietary: regular diet: weight is stable 184.2 pounds; has good appetite. Therapy none at this time. She continues to be followed for her chronic illnesses including: Aorto iliac atherosclerosis  Colostomy status  Major depression recurrent chronic  Past Medical History:  Diagnosis Date   Anxiety    Arthritis    Asthma    Cancer ([place])    breast   Depression    Diverticulosis    GERD (gastroesophageal reflux disease)    HTN (hypertension)    Shortness of breath    exertion   Sleep apnea    STOP BANG 4   Thyroid dysfunction     Past Surgical History:  Procedure Laterality Date   ABDOMINAL HYSTERECTOMY     arthroscopic surgery rt knee  1997   BREAST SURGERY  2001   LEFT/ CANCER    broken radius ulna  1990   left   broken right ankle  1976   EXPLORATORY LAPAROTOMY  08/24/2020   with partial sigmoidectomy creat of end colostomy and application of negative pressure dressing.      JOINT REPLACEMENT  right knee 2005 [facility]   left knee  2004   rt foot bone spur removed  [place]  01/22/2012   Procedure: TOTAL KNEE ARTHROPLASTY;  Surgeon: Carole Civil, MD;  Location: [facility];  Service: Orthopedics;  Laterality: Left;   TUBAL LIGATION      Social History   Socioeconomic History   Marital status: Widowed    Spouse name: Not on file   Number of children: Not on file   Years of education: Not on file   Highest education level: Not on file  Occupational History   Not on file  Tobacco Use   Smoking status: Former   Smokeless  tobacco: Former  Scientific laboratory technician Use: Never used  Substance and Sexual Activity   Alcohol use: No   Drug use: No   Sexual activity: Never  Other Topics Concern   Not on file  Social History Narrative   Not on file   Social Determinants of Health   Financial Resource Strain: Not on file  Food Insecurity: Not on file  Transportation Needs: Not on file  Physical Activity: Not on file  Stress: Not on file  Social Connections: Not on file  Intimate Partner Violence: Not on file   Family History  Problem Relation Age of Onset   Pseudochol deficiency Neg Hx    Malignant hyperthermia Neg Hx    Hypotension Neg Hx    Anesthesia problems Neg Hx       VITAL SIGNS BP 124/68    Pulse 74    Temp (!) 97.5 F (36.4 C)    Resp 18    Ht '4\' 9"'  (1.448 m)    Wt 184 lb 3.2 oz (83.6 kg)    SpO2 99%    BMI 39.86 kg/m   Outpatient Encounter Medications as of 09/09/2021  Medication Sig   acetaminophen (TYLENOL) 500 MG  tablet Take 500 mg by mouth every 8 (eight) hours as needed.   acetaminophen (TYLENOL) 500 MG tablet Take 500 mg by mouth at bedtime.   alendronate (FOSAMAX) 70 MG tablet Take 70 mg by mouth once a week. Every Sunday take with a full glass of water on an empty stomach.   atenolol (TENORMIN) 50 MG tablet Take 50 mg by mouth every evening.   BREO ELLIPTA 200-25 MCG/INH AEPB Inhale 1 puff into the lungs daily.   Calcium Carb-Cholecalciferol (CALCIUM 500 + D PO) Take 1 tablet by mouth daily.   chlorthalidone (HYGROTON) 25 MG tablet Take 25 mg by mouth daily.   cholecalciferol (VITAMIN D) 1000 UNITS tablet Take 2,000 Units by mouth every morning.   Cyanocobalamin 1000 MCG/ML KIT Inject as directed. Once a day on Saturday from 09/04/2020-10/02/2020. Then take one injection once a month starting 11/04/2020   DULoxetine (CYMBALTA) 20 MG capsule Take 40 mg by mouth daily.   loratadine (CLARITIN) 10 MG tablet Take 10 mg by mouth daily.   melatonin 3 MG TABS tablet Take 3 mg by mouth at  bedtime.   Multiple Vitamins-Minerals (PRESERVISION AREDS 2) CAPS Take 1 capsule by mouth in the morning and at bedtime.   NON FORMULARY Diet:Regular   nystatin (MYCOSTATIN/NYSTOP) powder Apply 1 application topically daily as needed. Special Instructions: Apply thin layer of Nystatin powder to skin around stoma when changing colostomy bag PRN irritation.   pantoprazole (PROTONIX) 40 MG tablet Take 40 mg by mouth 2 (two) times daily.   phenol (CHLORASEPTIC WARM SORE THROAT) 1.4 % LIQD 1 spray every 12 (twelve) hours. mucous membrane Sore throat   potassium chloride SA (KLOR-CON) 20 MEQ tablet Take 40 mEq by mouth 2 (two) times daily.   No facility-administered encounter medications on file as of 09/09/2021.     SIGNIFICANT DIAGNOSTIC EXAMS   NO NEW EXAMS   LABS REVIEWED PREVIOUS  08-31-20: wbc 6.0; hgb 10.7; hct 32.1; mcv 102.0 plt 288; glucose 153; bun 6; creat 0.42; k+ 3.4; na++ 134  09-06-20: wbc 6.3; hgb 11.4 hct 36.2 mcv 104.9; plt 340; glucose 98 bun 10; creat 0.61; k+ 3.6; na++ 133; ca 9.2 liver normal albumin 2.6 09-20-20: wbc 7.0; hgb 13.9; hct 43.1; mcv 103.1 plt 332 01-06-21: wb 6.6; hgb 14.5; hct 44.2; mcv 102.1 plt 257; glucose 117; bun 28; creat 0.63; k+ 3.1; na++ 138; ca 9.5 GFR>60; live normal albumin 3.6 02-10-21: glucose 89; bun 27; creat 0.66; k+ 2.8; na++ 138; ca 9.2 GFR>60 02-11-21: k+ 4.6  04-07-21: wbc 6.8; hgb 13.9; hct 42.2; mcv 104.5 plt 228; glucose 125; bun 25; creat 0.70; k+ 3.6; na++ 137; ca 9.1; GFR>60; d-dimer 0.55; CRP 2.3  06-02-21: vitamin B12: 447 folate 29.1  NO NEW LABS.   Review of Systems  Constitutional:  Negative for malaise/fatigue.  Respiratory:  Negative for cough and shortness of breath.   Cardiovascular:  Negative for chest pain, palpitations and leg swelling.  Gastrointestinal:  Negative for abdominal pain, constipation and heartburn.  Musculoskeletal:  Negative for back pain, joint pain and myalgias.  Skin: Negative.   Neurological:   Negative for dizziness.  Psychiatric/Behavioral:  The patient is not nervous/anxious.    Physical Exam Constitutional:      General: She is not in acute distress.    Appearance: She is well-developed. She is not diaphoretic.  Neck:     Thyroid: No thyromegaly.  Cardiovascular:     Rate and Rhythm: Normal rate and regular rhythm.  Heart sounds: Normal heart sounds.  Pulmonary:     Effort: Pulmonary effort is normal. No respiratory distress.     Breath sounds: Normal breath sounds.  Abdominal:     General: Bowel sounds are normal. There is no distension.     Palpations: Abdomen is soft.     Tenderness: There is no abdominal tenderness.     Comments: colostomy  Musculoskeletal:        General: Normal range of motion.     Cervical back: Neck supple.     Right lower leg: No edema.     Left lower leg: No edema.     Comments: Scoliosis    Lymphadenopathy:     Cervical: No cervical adenopathy.  Skin:    General: Skin is warm and dry.  Neurological:     Mental Status: She is alert. Mental status is at baseline.  Psychiatric:        Mood and Affect: Mood normal.     ASSESSMENT/ PLAN:  TODAY  Aorto iliac atherosclerosis Colostomy status  Major depression recurrent chronic   Will continue current medications Will continue current plan of care Will continue to monitor her status.    Time spent with patient: 40 minutes: medications; plan of care.    Ok Edwards NP [facility]  Contact [phone] Monday through Friday 8am- 5pm  After hours call [phone]

## 2021-09-20 ENCOUNTER — Non-Acute Institutional Stay (SKILLED_NURSING_FACILITY): Payer: Medicare Other | Admitting: Adult Health

## 2021-09-20 ENCOUNTER — Encounter: Payer: Self-pay | Admitting: Adult Health

## 2021-09-20 DIAGNOSIS — M47816 Spondylosis without myelopathy or radiculopathy, lumbar region: Secondary | ICD-10-CM

## 2021-09-20 DIAGNOSIS — J3089 Other allergic rhinitis: Secondary | ICD-10-CM

## 2021-09-20 DIAGNOSIS — Z66 Do not resuscitate: Secondary | ICD-10-CM

## 2021-09-20 DIAGNOSIS — K56609 Unspecified intestinal obstruction, unspecified as to partial versus complete obstruction: Secondary | ICD-10-CM

## 2021-09-20 DIAGNOSIS — Z933 Colostomy status: Secondary | ICD-10-CM

## 2021-09-20 DIAGNOSIS — M810 Age-related osteoporosis without current pathological fracture: Secondary | ICD-10-CM

## 2021-09-20 DIAGNOSIS — I708 Atherosclerosis of other arteries: Secondary | ICD-10-CM

## 2021-09-20 DIAGNOSIS — I10 Essential (primary) hypertension: Secondary | ICD-10-CM

## 2021-09-20 DIAGNOSIS — K219 Gastro-esophageal reflux disease without esophagitis: Secondary | ICD-10-CM

## 2021-09-20 DIAGNOSIS — J4540 Moderate persistent asthma, uncomplicated: Secondary | ICD-10-CM

## 2021-09-20 DIAGNOSIS — I700 Atherosclerosis of aorta: Secondary | ICD-10-CM | POA: Diagnosis not present

## 2021-09-20 DIAGNOSIS — F339 Major depressive disorder, recurrent, unspecified: Secondary | ICD-10-CM

## 2021-09-20 DIAGNOSIS — E876 Hypokalemia: Secondary | ICD-10-CM

## 2021-09-20 DIAGNOSIS — D539 Nutritional anemia, unspecified: Secondary | ICD-10-CM

## 2021-09-20 NOTE — Progress Notes (Signed)
Provider:Aurie [facility]oun Nyoka Cowden NP  Location  [facility]   PCP: Gerlene Fee, NP   Extended Emergency Contact Information Primary Emergency Contact: [facility] Address: [address]          [place], [place] [address] [place] Work Phone: [phone] Relation: Daughter  Codes status: dnr  Goals of care: advanced directive information Advanced Directives 09/20/2021  Does Patient Have a Medical Advance Directive? Yes  Type of Advance Directive Out of facility DNR (pink MOST or yellow form)  Does patient want to make changes to medical advance directive? No - Patient declined  Would patient like information on creating a medical advance directive? -  Pre-existing out of facility DNR order (yellow form or pink MOST form) Yellow form placed in chart (order not valid for inpatient use)     No Known Allergies  Chief Complaint  Patient presents with   Annual Exam    Yearly physical.    HPI  She is a 87 year old long term resident of this facility being seen for her annual exam. She has not required any hospitalization over the past year. She has not required any visits to the ED. There are no reports of uncontrolled pain. There are no reports of anxiety or depressive thoughts. Her weight has been stable over the past year. She continues to be followed for her chronic illnesses including:   Non-seasonal allergic rhinitis unspecified trigger:  GERD without esophagitis:  Essential hypertension:  Hypokalemia   Past Medical History:  Diagnosis Date   Anxiety    Arthritis    Asthma    Cancer ([place])    breast   Depression    Diverticulosis    GERD (gastroesophageal reflux disease)    HTN (hypertension)    Shortness of breath    exertion   Sleep apnea    STOP BANG 4   Thyroid dysfunction    Past Surgical History:  Procedure Laterality Date   ABDOMINAL HYSTERECTOMY     arthroscopic surgery rt knee  1997   BREAST SURGERY  2001   LEFT/ CANCER    broken  radius ulna  1990   left   broken right ankle  1976   EXPLORATORY LAPAROTOMY  08/24/2020   with partial sigmoidectomy creat of end colostomy and application of negative pressure dressing.      JOINT REPLACEMENT  right knee 2005 [facility]   left knee  2004   rt foot bone spur removed  [place]  01/22/2012   Procedure: TOTAL KNEE ARTHROPLASTY;  Surgeon: Carole Civil, MD;  Location: [facility];  Service: Orthopedics;  Laterality: Left;   TUBAL LIGATION      reports that she has quit smoking. She has quit using smokeless tobacco. She reports that she does not drink alcohol and does not use drugs. Social History   Tobacco Use   Smoking status: Former   Smokeless tobacco: Former  Scientific laboratory technician Use: Never used  Substance Use Topics   Alcohol use: No   Drug use: No   Family History  Problem Relation Age of Onset   Pseudochol deficiency Neg Hx    Malignant hyperthermia Neg Hx    Hypotension Neg Hx    Anesthesia problems Neg Hx     Pertinent  Health Maintenance Due  Topic Date Due   DEXA SCAN  Completed   INFLUENZA VACCINE  Discontinued  Fall Risk 01/11/2021 09/22/2021  Falls in the past year? 1 1  Was there an injury with Fall? 0 0  Fall Risk Category Calculator 1 1  Fall Risk Category Low Low  Patient Fall Risk Level Low fall risk Low fall risk  Patient at Risk for Falls Due to History of fall(s);Impaired balance/gait;Impaired mobility History of fall(s);Impaired balance/gait  Fall risk Follow up Falls evaluation completed -   Depression screen [facility] 2/9 09/22/2021 01/11/2021  Decreased Interest 0 0  Down, Depressed, Hopeless 0 0  PHQ - 2 Score 0 0    Functional Status Survey:    Outpatient Encounter Medications as of 09/20/2021  Medication Sig   acetaminophen (TYLENOL) 500 MG tablet Take 500 mg by mouth every 8 (eight) hours as needed.   acetaminophen (TYLENOL) 500 MG tablet Take 500 mg by mouth at bedtime.    alendronate (FOSAMAX) 70 MG tablet Take 70 mg by mouth once a week. Every Sunday take with a full glass of water on an empty stomach.   atenolol (TENORMIN) 50 MG tablet Take 50 mg by mouth every evening.   BREO ELLIPTA 200-25 MCG/INH AEPB Inhale 1 puff into the lungs daily.   Calcium Carb-Cholecalciferol (CALCIUM 500 + D PO) Take 1 tablet by mouth daily.   chlorthalidone (HYGROTON) 25 MG tablet Take 25 mg by mouth daily.   cholecalciferol (VITAMIN D) 1000 UNITS tablet Take 2,000 Units by mouth every morning.   Cyanocobalamin 1000 MCG/ML KIT Inject as directed. Once a day on Saturday from 09/04/2020-10/02/2020. Then take one injection once a month starting 11/04/2020   DULoxetine (CYMBALTA) 20 MG capsule Take 20 mg by mouth daily.   loratadine (CLARITIN) 10 MG tablet Take 10 mg by mouth daily.   melatonin 3 MG TABS tablet Take 3 mg by mouth at bedtime.   Multiple Vitamins-Minerals (PRESERVISION AREDS 2) CAPS Take 1 capsule by mouth in the morning and at bedtime.   NON FORMULARY Diet:Regular   nystatin (MYCOSTATIN/NYSTOP) powder Apply 1 application topically daily as needed. Special Instructions: Apply thin layer of Nystatin powder to skin around stoma when changing colostomy bag PRN irritation.   pantoprazole (PROTONIX) 40 MG tablet Take 40 mg by mouth 2 (two) times daily.   phenol (CHLORASEPTIC WARM SORE THROAT) 1.4 % LIQD 1 spray every 12 (twelve) hours. mucous membrane Sore throat   potassium chloride SA (KLOR-CON) 20 MEQ tablet Take 40 mEq by mouth 2 (two) times daily.   No facility-administered encounter medications on file as of 09/20/2021.     Vitals:   09/20/21 1434  BP: 138/80  Pulse: 85  Resp: 18  Temp: (!) 96.9 F (36.1 C)  SpO2: 99%  Weight: 184 lb 3.2 oz (83.6 kg)  Height: '4\' 9"'  (1.448 m)   Body mass index is 39.86 kg/m.  DIAGNOSTIC EXAMS   PREVIOUS   03-02-21: DEXA: t score -3.258  NO NEW LABS.   LABS REVIEWED PREVIOUS  08-31-20: wbc 6.0; hgb 10.7; hct 32.1; mcv  102.0 plt 288; glucose 153; bun 6; creat 0.42; k+ 3.4; na++ 134  09-06-20: wbc 6.3; hgb 11.4 hct 36.2 mcv 104.9; plt 340; glucose 98 bun 10; creat 0.61; k+ 3.6; na++ 133; ca 9.2 liver normal albumin 2.6 09-20-20: wbc 7.0; hgb 13.9; hct 43.1; mcv 103.1 plt 332 01-06-21: wb 6.6; hgb 14.5; hct 44.2; mcv 102.1 plt 257; glucose 117; bun 28; creat 0.63; k+ 3.1; na++ 138; ca 9.5 GFR>60; live normal albumin 3.6 02-10-21: glucose 89; bun 27; creat 0.66;  k+ 2.8; na++ 138; ca 9.2 GFR>60 02-11-21: k+ 4.6  04-07-21: wbc 6.8; hgb 13.9; hct 42.2; mcv 104.5 plt 228; glucose 125; bun 25; creat 0.70; k+ 3.6; na++ 137; ca 9.1; GFR>60; d-dimer 0.55; CRP 2.3  06-02-21: vitamin B12: 447 folate 29.1  NO NEW LABS.    Review of Systems  Unable to perform ROS: Dementia   Physical Exam Constitutional:      General: She is not in acute distress.    Appearance: She is well-developed. She is not diaphoretic.  HENT:     Nose: Nose normal.     Mouth/Throat:     Mouth: Mucous membranes are moist.     Pharynx: Oropharynx is clear.  Eyes:     Conjunctiva/sclera: Conjunctivae normal.  Neck:     Thyroid: No thyromegaly.  Cardiovascular:     Rate and Rhythm: Normal rate and regular rhythm.     Pulses: Normal pulses.     Heart sounds: Normal heart sounds.  Pulmonary:     Effort: Pulmonary effort is normal. No respiratory distress.     Breath sounds: Normal breath sounds.  Abdominal:     General: Bowel sounds are normal. There is no distension.     Palpations: Abdomen is soft.     Tenderness: There is no abdominal tenderness.     Comments: Colostomy   Musculoskeletal:        General: Normal range of motion.     Cervical back: Neck supple.     Right lower leg: No edema.     Left lower leg: No edema.     Comments: Scoliosis   Lymphadenopathy:     Cervical: No cervical adenopathy.  Skin:    General: Skin is warm and dry.  Neurological:     Mental Status: She is alert. Mental status is at baseline.  Psychiatric:         Mood and Affect: Mood normal.    ASSESSMENT/ PLAN:  TODAY  Non-seasonal allergic rhinitis unspecified trigger: is stable will continue claritin 10 mg daily flonase daily will monitor   2. GERD without esophagitis: is stable will continue protonix 40 mg twice daily   3. Essential hypertension: is stable b/p 138/80 will continue tenormin 50 mg daily and hygroton 25 mg daily   4. Hypokalemia: is stable k+ 3.6 will continue k+ 40 meq twice daily   5. Major depression recurrent chronic: is stable will continue cymbalta 40 mg daily melatonin 3 mg nightly no longer on ativan  6. Aortoiliac atherosclerosis: (ct 08-21-20) will monitor   7. Large bowel bowel obstruction/s/p exploratory lap/colostomy: will monitor   8. Degenerative disc disease lumbar: is stable will continue cymbalta 40 mg daily   9. Moderate persistent asthma in adult: is stable will continue breo ellipta 200/25 mcg 1 puff daily   10. Macrocytic anemia: 13.9 will monitor   11. Post menopausal osteoporosis: t score -3.258 will continue fosamax 70 mg weekly  12. Vitamin B 12 deficiency: level is 447 will continue monthly injections      Ok Edwards NP [facility]   [phone]

## 2021-09-22 ENCOUNTER — Encounter: Payer: Self-pay | Admitting: Adult Health

## 2021-09-22 DIAGNOSIS — M810 Age-related osteoporosis without current pathological fracture: Secondary | ICD-10-CM | POA: Insufficient documentation

## 2021-09-27 ENCOUNTER — Encounter: Payer: Self-pay | Admitting: Adult Health

## 2021-09-27 ENCOUNTER — Non-Acute Institutional Stay (SKILLED_NURSING_FACILITY): Payer: Medicare Other | Admitting: Adult Health

## 2021-09-27 DIAGNOSIS — J4540 Moderate persistent asthma, uncomplicated: Secondary | ICD-10-CM | POA: Diagnosis not present

## 2021-09-27 DIAGNOSIS — R0902 Hypoxemia: Secondary | ICD-10-CM | POA: Diagnosis not present

## 2021-09-27 DIAGNOSIS — R059 Cough, unspecified: Secondary | ICD-10-CM | POA: Diagnosis not present

## 2021-09-27 DIAGNOSIS — R0989 Other specified symptoms and signs involving the circulatory and respiratory systems: Secondary | ICD-10-CM | POA: Diagnosis not present

## 2021-09-27 NOTE — Progress Notes (Signed)
Location:  [place] of Service:   SNF    CODE STATUS: dnr   No Known Allergies  Chief Complaint  Patient presents with   Acute Visit    Cough and congestion     HPI:  Staff report that over this past weekend she has had coughing with congestion present. There are no reports of fevers present. She has tested negative for COVID twice. She tells me that she is not coughing "now". She denies any shortness of breath.   Past Medical History:  Diagnosis Date   Anxiety    Arthritis    Asthma    Cancer ([place])    breast   Depression    Diverticulosis    GERD (gastroesophageal reflux disease)    HTN (hypertension)    Shortness of breath    exertion   Sleep apnea    STOP BANG 4   Thyroid dysfunction     Past Surgical History:  Procedure Laterality Date   ABDOMINAL HYSTERECTOMY     arthroscopic surgery rt knee  1997   BREAST SURGERY  2001   LEFT/ CANCER    broken radius ulna  1990   left   broken right ankle  1976   EXPLORATORY LAPAROTOMY  08/24/2020   with partial sigmoidectomy creat of end colostomy and application of negative pressure dressing.      JOINT REPLACEMENT  right knee 2005 [facility]   left knee  2004   rt foot bone spur removed  [place]  01/22/2012   Procedure: TOTAL KNEE ARTHROPLASTY;  Surgeon: Carole Civil, MD;  Location: [facility];  Service: Orthopedics;  Laterality: Left;   TUBAL LIGATION      Social History   Socioeconomic History   Marital status: Widowed    Spouse name: Not on file   Number of children: Not on file   Years of education: Not on file   Highest education level: Not on file  Occupational History   Not on file  Tobacco Use   Smoking status: Former   Smokeless tobacco: Former  Scientific laboratory technician Use: Never used  Substance and Sexual Activity   Alcohol use: No   Drug use: No   Sexual activity: Never  Other Topics Concern   Not on file  Social History  Narrative   Not on file   Social Determinants of Health   Financial Resource Strain: Not on file  Food Insecurity: Not on file  Transportation Needs: Not on file  Physical Activity: Not on file  Stress: Not on file  Social Connections: Not on file  Intimate Partner Violence: Not on file   Family History  Problem Relation Age of Onset   Pseudochol deficiency Neg Hx    Malignant hyperthermia Neg Hx    Hypotension Neg Hx    Anesthesia problems Neg Hx       VITAL SIGNS BP 120/60    Pulse 84    Temp 97.6 F (36.4 C)    Resp 18    Ht _0  (1.448 m)    Wt 184 lb 3.2 oz (83.6 kg)    BMI 39.86 kg/m   Outpatient Encounter Medications as of 09/27/2021  Medication Sig   acetaminophen (TYLENOL) 500 MG tablet Take 500 mg by mouth every 8 (eight) hours as needed.   acetaminophen (TYLENOL) 500 MG tablet Take 500 mg by mouth at bedtime.  alendronate (FOSAMAX) 70 MG tablet Take 70 mg by mouth once a week. Every Sunday take with a full glass of water on an empty stomach.   atenolol (TENORMIN) 50 MG tablet Take 50 mg by mouth every evening.   BREO ELLIPTA 200-25 MCG/INH AEPB Inhale 1 puff into the lungs daily.   Calcium Carb-Cholecalciferol (CALCIUM 500 + D PO) Take 1 tablet by mouth daily.   chlorthalidone (HYGROTON) 25 MG tablet Take 25 mg by mouth daily.   cholecalciferol (VITAMIN D) 1000 UNITS tablet Take 2,000 Units by mouth every morning.   Cyanocobalamin 1000 MCG/ML KIT Inject as directed. Once a day on Saturday from 09/04/2020-10/02/2020. Then take one injection once a month starting 11/04/2020   DULoxetine (CYMBALTA) 20 MG capsule Take 20 mg by mouth daily.   loratadine (CLARITIN) 10 MG tablet Take 10 mg by mouth daily.   melatonin 3 MG TABS tablet Take 3 mg by mouth at bedtime.   Multiple Vitamins-Minerals (PRESERVISION AREDS 2) CAPS Take 1 capsule by mouth in the morning and at bedtime.   NON FORMULARY Diet:Regular   nystatin (MYCOSTATIN/NYSTOP) powder Apply 1 application topically  daily as needed. Special Instructions: Apply thin layer of Nystatin powder to skin around stoma when changing colostomy bag PRN irritation.   pantoprazole (PROTONIX) 40 MG tablet Take 40 mg by mouth 2 (two) times daily.   phenol (CHLORASEPTIC WARM SORE THROAT) 1.4 % LIQD 1 spray every 12 (twelve) hours. mucous membrane Sore throat   potassium chloride SA (KLOR-CON) 20 MEQ tablet Take 40 mEq by mouth 2 (two) times daily.   No facility-administered encounter medications on file as of 09/27/2021.     SIGNIFICANT DIAGNOSTIC EXAMS  PREVIOUS   03-02-21: DEXA: t score -3.258  NO NEW LABS.   LABS REVIEWED PREVIOUS  01-06-21: wb 6.6; hgb 14.5; hct 44.2; mcv 102.1 plt 257; glucose 117; bun 28; creat 0.63; k+ 3.1; na++ 138; ca 9.5 GFR>60; live normal albumin 3.6 02-10-21: glucose 89; bun 27; creat 0.66; k+ 2.8; na++ 138; ca 9.2 GFR>60 02-11-21: k+ 4.6  04-07-21: wbc 6.8; hgb 13.9; hct 42.2; mcv 104.5 plt 228; glucose 125; bun 25; creat 0.70; k+ 3.6; na++ 137; ca 9.1; GFR>60; d-dimer 0.55; CRP 2.3  06-02-21: vitamin B12: 447 folate 29.1  NO NEW LABS.   Review of Systems  Unable to perform ROS: Dementia    Physical Exam Constitutional:      General: She is not in acute distress.    Appearance: She is well-developed. She is not diaphoretic.  HENT:     Mouth/Throat:     Mouth: Mucous membranes are moist.     Pharynx: Oropharynx is clear.  Eyes:     Conjunctiva/sclera: Conjunctivae normal.  Neck:     Thyroid: No thyromegaly.  Cardiovascular:     Rate and Rhythm: Normal rate and regular rhythm.     Heart sounds: Normal heart sounds.  Pulmonary:     Effort: Pulmonary effort is normal. No respiratory distress.     Breath sounds: Normal breath sounds.     Comments: Breath sounds are coarse  Abdominal:     General: Bowel sounds are normal. There is no distension.     Palpations: Abdomen is soft.     Tenderness: There is no abdominal tenderness.     Comments: Colostomy   Musculoskeletal:         General: Normal range of motion.     Cervical back: Neck supple.     Right lower leg:  No edema.     Left lower leg: No edema.     Comments: Scoliosis   Lymphadenopathy:     Cervical: No cervical adenopathy.  Skin:    General: Skin is warm and dry.  Neurological:     Mental Status: She is alert. Mental status is at baseline.  Psychiatric:        Mood and Affect: Mood normal.      ASSESSMENT/ PLAN:  TODAY  Moderate persistent asthma without complications  Will begin duoneb every 6 hours through 10-04-21.  Will monitor her status.    Ok Edwards NP [facility] Adult Medicine  Contact [phone] Monday through Friday 8am- 5pm  After hours call [phone]

## 2021-09-29 DIAGNOSIS — Z1159 Encounter for screening for other viral diseases: Secondary | ICD-10-CM | POA: Diagnosis not present

## 2021-09-29 DIAGNOSIS — K56699 Other intestinal obstruction unspecified as to partial versus complete obstruction: Secondary | ICD-10-CM | POA: Diagnosis not present

## 2021-10-06 DIAGNOSIS — Z1159 Encounter for screening for other viral diseases: Secondary | ICD-10-CM | POA: Diagnosis not present

## 2021-10-06 DIAGNOSIS — K56699 Other intestinal obstruction unspecified as to partial versus complete obstruction: Secondary | ICD-10-CM | POA: Diagnosis not present

## 2021-10-13 ENCOUNTER — Encounter: Payer: Self-pay | Admitting: Adult Health

## 2021-10-13 ENCOUNTER — Non-Acute Institutional Stay (SKILLED_NURSING_FACILITY): Payer: Medicare Other | Admitting: Adult Health

## 2021-10-13 DIAGNOSIS — I708 Atherosclerosis of other arteries: Secondary | ICD-10-CM

## 2021-10-13 DIAGNOSIS — M47816 Spondylosis without myelopathy or radiculopathy, lumbar region: Secondary | ICD-10-CM | POA: Diagnosis not present

## 2021-10-13 DIAGNOSIS — K56609 Unspecified intestinal obstruction, unspecified as to partial versus complete obstruction: Secondary | ICD-10-CM

## 2021-10-13 DIAGNOSIS — I700 Atherosclerosis of aorta: Secondary | ICD-10-CM | POA: Diagnosis not present

## 2021-10-13 DIAGNOSIS — Z1159 Encounter for screening for other viral diseases: Secondary | ICD-10-CM | POA: Diagnosis not present

## 2021-10-13 DIAGNOSIS — Z933 Colostomy status: Secondary | ICD-10-CM | POA: Diagnosis not present

## 2021-10-13 DIAGNOSIS — F339 Major depressive disorder, recurrent, unspecified: Secondary | ICD-10-CM

## 2021-10-13 DIAGNOSIS — K56699 Other intestinal obstruction unspecified as to partial versus complete obstruction: Secondary | ICD-10-CM | POA: Diagnosis not present

## 2021-10-13 NOTE — Progress Notes (Signed)
Location:  [place] Room Number: 144 Place of Service:  SNF (31)   CODE STATUS: dnr   No Known Allergies  Chief Complaint  Patient presents with   Medical Management of Chronic Issues            Major depression recurrent chronic: Aortoiliac atherosclerosis: Large bowel obstruction/ s/p colostomy:  Degenerative disc disease lumbar    HPI:  She is a 88 year old long term resident of this facility being seen for the management of her chronic illnesses: Major depression recurrent chronic: Aortoiliac atherosclerosis: Large bowel obstruction/ s/p colostomy:  Degenerative disc disease lumbar. There are no reports of uncontrolled pain. Her weight is stable she is no longer coughing. There are no reports of shortness of breath.   Past Medical History:  Diagnosis Date   Anxiety    Arthritis    Asthma    Cancer ([place])    breast   Depression    Diverticulosis    GERD (gastroesophageal reflux disease)    HTN (hypertension)    Shortness of breath    exertion   Sleep apnea    STOP BANG 4   Thyroid dysfunction     Past Surgical History:  Procedure Laterality Date   ABDOMINAL HYSTERECTOMY     arthroscopic surgery rt knee  1997   BREAST SURGERY  2001   LEFT/ CANCER    broken radius ulna  1990   left   broken right ankle  1976   EXPLORATORY LAPAROTOMY  08/24/2020   with partial sigmoidectomy creat of end colostomy and application of negative pressure dressing.      JOINT REPLACEMENT  right knee 2005 [facility]   left knee  2004   rt foot bone spur removed  [place]  01/22/2012   Procedure: TOTAL KNEE ARTHROPLASTY;  Surgeon: Carole Civil, MD;  Location: [facility];  Service: Orthopedics;  Laterality: Left;   TUBAL LIGATION      Social History   Socioeconomic History   Marital status: Widowed    Spouse name: Not on file   Number of children: Not on file   Years of education: Not on file   Highest  education level: Not on file  Occupational History   Not on file  Tobacco Use   Smoking status: Former   Smokeless tobacco: Former  Scientific laboratory technician Use: Never used  Substance and Sexual Activity   Alcohol use: No   Drug use: No   Sexual activity: Never  Other Topics Concern   Not on file  Social History Narrative   Not on file   Social Determinants of Health   Financial Resource Strain: Not on file  Food Insecurity: Not on file  Transportation Needs: Not on file  Physical Activity: Not on file  Stress: Not on file  Social Connections: Not on file  Intimate Partner Violence: Not on file   Family History  Problem Relation Age of Onset   Pseudochol deficiency Neg Hx    Malignant hyperthermia Neg Hx    Hypotension Neg Hx    Anesthesia problems Neg Hx       VITAL SIGNS BP 118/76    Pulse 64    Temp (!) 97.4 F (36.3 C)    Ht '4\' 9"'  (1.448 m)    Wt 184 lb 3.2 oz (83.6 kg)    BMI 39.86 kg/m   Outpatient Encounter  Medications as of 10/13/2021  Medication Sig   acetaminophen (TYLENOL) 500 MG tablet Take 500 mg by mouth every 8 (eight) hours as needed.   acetaminophen (TYLENOL) 500 MG tablet Take 500 mg by mouth at bedtime.   alendronate (FOSAMAX) 70 MG tablet Take 70 mg by mouth once a week. Every Sunday take with a full glass of water on an empty stomach.   atenolol (TENORMIN) 50 MG tablet Take 50 mg by mouth every evening.   BREO ELLIPTA 200-25 MCG/INH AEPB Inhale 1 puff into the lungs daily.   Calcium Carb-Cholecalciferol (CALCIUM 500 + D PO) Take 1 tablet by mouth daily.   chlorthalidone (HYGROTON) 25 MG tablet Take 25 mg by mouth daily.   cholecalciferol (VITAMIN D) 1000 UNITS tablet Take 2,000 Units by mouth every morning.   Cyanocobalamin 1000 MCG/ML KIT Inject as directed. Once a day on Saturday from 09/04/2020-10/02/2020. Then take one injection once a month starting 11/04/2020   DULoxetine (CYMBALTA) 20 MG capsule Take 20 mg by mouth daily.   loratadine  (CLARITIN) 10 MG tablet Take 10 mg by mouth daily.   melatonin 3 MG TABS tablet Take 3 mg by mouth at bedtime.   Multiple Vitamins-Minerals (PRESERVISION AREDS 2) CAPS Take 1 capsule by mouth in the morning and at bedtime.   NON FORMULARY Diet:Regular   nystatin (MYCOSTATIN/NYSTOP) powder Apply 1 application topically daily as needed. Special Instructions: Apply thin layer of Nystatin powder to skin around stoma when changing colostomy bag PRN irritation.   pantoprazole (PROTONIX) 40 MG tablet Take 40 mg by mouth 2 (two) times daily.   phenol (CHLORASEPTIC WARM SORE THROAT) 1.4 % LIQD 1 spray every 12 (twelve) hours. mucous membrane Sore throat   potassium chloride SA (KLOR-CON) 20 MEQ tablet Take 40 mEq by mouth 2 (two) times daily.   No facility-administered encounter medications on file as of 10/13/2021.     SIGNIFICANT DIAGNOSTIC EXAMS  PREVIOUS   03-02-21: DEXA: t score -3.258  NO NEW LABS.   LABS REVIEWED PREVIOUS  01-06-21: wb 6.6; hgb 14.5; hct 44.2; mcv 102.1 plt 257; glucose 117; bun 28; creat 0.63; k+ 3.1; na++ 138; ca 9.5 GFR>60; live normal albumin 3.6 02-10-21: glucose 89; bun 27; creat 0.66; k+ 2.8; na++ 138; ca 9.2 GFR>60 02-11-21: k+ 4.6  04-07-21: wbc 6.8; hgb 13.9; hct 42.2; mcv 104.5 plt 228; glucose 125; bun 25; creat 0.70; k+ 3.6; na++ 137; ca 9.1; GFR>60; d-dimer 0.55; CRP 2.3  06-02-21: vitamin B12: 447 folate 29.1  NO NEW LABS.   Review of Systems  Unable to perform ROS: Dementia   Physical Exam Constitutional:      General: She is not in acute distress.    Appearance: She is well-developed. She is not diaphoretic.  Neck:     Thyroid: No thyromegaly.  Cardiovascular:     Rate and Rhythm: Normal rate and regular rhythm.     Heart sounds: Normal heart sounds.  Pulmonary:     Effort: Pulmonary effort is normal. No respiratory distress.     Breath sounds: Normal breath sounds.  Abdominal:     General: Bowel sounds are normal. There is no distension.      Palpations: Abdomen is soft.     Tenderness: There is no abdominal tenderness.     Comments: colostomy  Musculoskeletal:        General: Normal range of motion.     Cervical back: Neck supple.     Right lower leg: No edema.  Left lower leg: No edema.     Comments: Scoliosis; kyphosis   Lymphadenopathy:     Cervical: No cervical adenopathy.  Skin:    General: Skin is warm and dry.  Neurological:     Mental Status: She is alert. Mental status is at baseline.  Psychiatric:        Mood and Affect: Mood normal.    ASSESSMENT/ PLAN:  TODAY  Major depression recurrent chronic: is stable will continue cymbalta 40 mg daily melatonin 3 mg nightly not on ativan  2. Aortoiliac atherosclerosis: (ct 08-21-20) will monitor   3. Large bowel obstruction/ s/p colostomy: will monitor  4. Degenerative disc disease lumbar: is stable will continue cymbalta 40 mg daily   PREVIOUS   5. Moderate persistent asthma in adult: is stable will continue breo ellipta 200/25 mcg 1 puff daily   6. Macrocytic anemia: 13.9 will monitor   7. Post menopausal osteoporosis: t score -3.258 will continue fosamax 70 mg weekly  8. Vitamin B 12 deficiency: level is 447 will continue monthly injections    9. Non-seasonal allergic rhinitis unspecified trigger: is stable will continue claritin 10 mg daily flonase daily will monitor   10. GERD without esophagitis: is stable will continue protonix 40 mg twice daily   11. Essential hypertension: is stable b/p 118/76 will continue tenormin 50 mg daily and hygroton 25 mg daily   12  Hypokalemia: is stable k+ 3.6 will continue k+ 40 meq twice daily       Ok Edwards NP [facility]  call [phone]

## 2021-10-17 ENCOUNTER — Other Ambulatory Visit (HOSPITAL_COMMUNITY)
Admission: RE | Admit: 2021-10-17 | Discharge: 2021-10-17 | Disposition: A | Discharge status: Home / Self Care | Payer: Medicare Other | Source: Skilled Nursing Facility | Attending: Adult Health | Admitting: Adult Health

## 2021-10-17 DIAGNOSIS — I10 Essential (primary) hypertension: Secondary | ICD-10-CM | POA: Insufficient documentation

## 2021-10-17 LAB — COMPREHENSIVE METABOLIC PANEL
ALT: 12 U/L (ref 0–44)
AST: 17 U/L (ref 15–41)
Albumin: 3.8 g/dL (ref 3.5–5.0)
Alkaline Phosphatase: 60 U/L (ref 38–126)
Anion gap: 11 (ref 5–15)
BUN: 17 mg/dL (ref 8–23)
CO2: 27 mmol/L (ref 22–32)
Calcium: 9.1 mg/dL (ref 8.9–10.3)
Chloride: 99 mmol/L (ref 98–111)
Creatinine, Ser: 0.66 mg/dL (ref 0.44–1.00)
GFR, Estimated: >60 mL/min (ref >60)
Glucose, Bld: 97 mg/dL (ref 70–99)
Potassium: 3.4 mmol/L — ABNORMAL LOW (ref 3.5–5.1)
Sodium: 137 mmol/L (ref 135–145)
Total Bilirubin: 0.7 mg/dL (ref 0.3–1.2)
Total Protein: 7.2 g/dL (ref 6.5–8.1)

## 2021-10-17 LAB — CBC
HCT: 44.3 % (ref 36.0–46.0)
Hemoglobin: 14.9 g/dL (ref 12.0–15.0)
MCH: 35.1 pg — ABNORMAL HIGH (ref 26.0–34.0)
MCHC: 33.6 g/dL (ref 30.0–36.0)
MCV: 104.5 fL — ABNORMAL HIGH (ref 80.0–100.0)
Platelets: 267 10*3/uL (ref 150–400)
RBC: 4.24 MIL/uL (ref 3.87–5.11)
RDW: 12 % (ref 11.5–15.5)
WBC: 5.9 10*3/uL (ref 4.0–10.5)
nRBC: 0 % (ref 0.0–0.2)

## 2021-10-20 DIAGNOSIS — Z1159 Encounter for screening for other viral diseases: Secondary | ICD-10-CM | POA: Diagnosis not present

## 2021-10-20 DIAGNOSIS — K56699 Other intestinal obstruction unspecified as to partial versus complete obstruction: Secondary | ICD-10-CM | POA: Diagnosis not present

## 2021-10-27 DIAGNOSIS — K56699 Other intestinal obstruction unspecified as to partial versus complete obstruction: Secondary | ICD-10-CM | POA: Diagnosis not present

## 2021-10-27 DIAGNOSIS — Z1159 Encounter for screening for other viral diseases: Secondary | ICD-10-CM | POA: Diagnosis not present

## 2021-11-03 DIAGNOSIS — Z1159 Encounter for screening for other viral diseases: Secondary | ICD-10-CM | POA: Diagnosis not present

## 2021-11-03 DIAGNOSIS — K56699 Other intestinal obstruction unspecified as to partial versus complete obstruction: Secondary | ICD-10-CM | POA: Diagnosis not present

## 2021-11-08 ENCOUNTER — Encounter: Payer: Self-pay | Admitting: Internal Medicine

## 2021-11-08 ENCOUNTER — Non-Acute Institutional Stay (SKILLED_NURSING_FACILITY): Payer: Medicare Other | Admitting: Internal Medicine

## 2021-11-08 DIAGNOSIS — N182 Chronic kidney disease, stage 2 (mild): Secondary | ICD-10-CM | POA: Diagnosis not present

## 2021-11-08 DIAGNOSIS — H903 Sensorineural hearing loss, bilateral: Secondary | ICD-10-CM

## 2021-11-08 DIAGNOSIS — D539 Nutritional anemia, unspecified: Secondary | ICD-10-CM | POA: Diagnosis not present

## 2021-11-08 DIAGNOSIS — I10 Essential (primary) hypertension: Secondary | ICD-10-CM

## 2021-11-08 DIAGNOSIS — L299 Pruritus, unspecified: Secondary | ICD-10-CM | POA: Diagnosis not present

## 2021-11-08 DIAGNOSIS — M533 Sacrococcygeal disorders, not elsewhere classified: Secondary | ICD-10-CM

## 2021-11-08 NOTE — Progress Notes (Signed)
NURSING HOME LOCATION: [facility] ROOM NUMBER: 144  CODE STATUS: DNR  PCP: Ok Edwards, NP  This is a nursing facility follow up visit of chronic medical diagnoses & to document compliance with Regulation 483.30 (c) in [employer] [place] [employer] [place] Manual Phase 2 which mandates caregiver visit ( visits can alternate among physician, PA or NP as per statutes) within 10 days of 30 days / 60 days/ 90 days post admission to SNF date    Interim medical record and care since last SNF visit was updated with review of diagnostic studies and change in clinical status since last visit were documented.  HPI: She is permanent resident of the facility with medical diagnoses of history of asthma, history of breast cancer, history of diverticulosis, GERD, essential hypertension, thyroid dysfunction, and OSA. Significant surgeries include colostomy for obstruction.  Most recent labs were 10/17/2021.  Mild hypokalemia was present with a potassium of 3.4.  Creatinine was 0.66 with a GFR greater than 60 indicating CKD stage II.  Macrocytosis was present with an MCV of 104.5 with H/H of 14.9/43.3.  The last B12 on record was 447 on 06/02/2021.  Folate was 29.1 on that date.  There is no current TSH on record.  Review of systems: Completion was hindered by profound hearing loss.  Her daughter states that the hearing aids are being checked.  The patient questions "something in my ears".  She describes diffuse pruritus which her daughter feels is related to chronic dry skin.  She has been using a moisturizing lotion.  She does not have significant associated extrinsic symptoms.  She does describe occasional itchy and watery eyes as well as occasional sneezing.  She also has intermittent left sacroiliac discomfort without sciatica or stool or urinary incontinence.  Constitutional: No fever, significant weight change, fatigue  Eyes: No redness, discharge, pain Cardiovascular: No chest pain,  palpitations, paroxysmal nocturnal dyspnea, claudication, edema  Respiratory: No cough, sputum production, hemoptysis, DOE, significant snoring, apnea   Gastrointestinal: No heartburn, dysphagia, abdominal pain, nausea /vomiting, rectal bleeding, melena, change in bowels Genitourinary: No dysuria, hematuria, pyuria Dermatologic: No rash, change in appearance of skin Neurologic: No dizziness, headache, syncope, seizures, numbness, tingling Psychiatric: No significant anxiety, depression, insomnia, anorexia Endocrine: No change in hair/skin/nails, excessive thirst, excessive hunger, excessive urination  Hematologic/lymphatic: No significant bruising, lymphadenopathy, abnormal bleeding Allergy/immunology: No urticaria, angioedema  Physical exam:  Pertinent or positive findings: Central obesity is present.  As noted she is markedly deaf.  She is not wearing her hearing aids.  There is some cerumen in both otic canals but no definite clinical impaction.  She has complete dentures.  Heart sounds are markedly distant.  Breath sounds are decreased.  Abdomen is protuberant.  She has DIP osteoarthritic change of the hands.  Knees are fusiformly enlarged.  Pedal pulses are decreased.  She did exhibit slight tenderness in the left sacroiliac joint area.  General appearance: Adequately nourished; no acute distress, increased work of breathing is present.   Lymphatic: No lymphadenopathy about the head, neck, axilla. Eyes: No conjunctival inflammation or lid edema is present. There is no scleral icterus. Ears:  External ear exam shows no significant lesions or deformities.   Nose:  External nasal examination shows no deformity or inflammation. Nasal mucosa are pink and moist without lesions, exudates Oral exam:  There is no oropharyngeal erythema or exudate. Neck:  No thyromegaly, masses, tenderness noted.    Heart:  No gallop, murmur, click, rub .  Lungs: without wheezes, rhonchi, rales, rubs. Abdomen:  Bowel sounds are normal. Abdomen is soft and nontender with no organomegaly, hernias, masses. GU: Deferred  Extremities:  No cyanosis, clubbing, edema  Neurologic exam :Balance, Rhomberg, finger to nose testing could not be completed due to clinical state Skin: Warm & dry w/o tenting. No significant lesions or rash.  See summary under each active problem in the Problem List with associated updated therapeutic plan

## 2021-11-09 ENCOUNTER — Encounter: Payer: Self-pay | Admitting: Internal Medicine

## 2021-11-09 NOTE — Assessment & Plan Note (Signed)
BP controlled; but discuss change in chlorthalidone due to K+ of 3.4 with NP

## 2021-11-10 ENCOUNTER — Encounter: Payer: Self-pay | Admitting: Internal Medicine

## 2021-11-10 DIAGNOSIS — K56699 Other intestinal obstruction unspecified as to partial versus complete obstruction: Secondary | ICD-10-CM | POA: Diagnosis not present

## 2021-11-10 DIAGNOSIS — H9190 Unspecified hearing loss, unspecified ear: Secondary | ICD-10-CM | POA: Insufficient documentation

## 2021-11-10 DIAGNOSIS — Z1159 Encounter for screening for other viral diseases: Secondary | ICD-10-CM | POA: Diagnosis not present

## 2021-11-10 NOTE — Assessment & Plan Note (Signed)
Clinically there is not significant cerumen and the most likely explanation is progression of underlying disease.  Audiology follow-up indicated.

## 2021-11-10 NOTE — Assessment & Plan Note (Signed)
Macrocytosis persists with an MCV of 104.5 with resolution of anemia.  H/H of 14.9/43.3.  B12 level sufficiently current with a value of 447 on 06/02/2021.  In reference to macrocytosis folate was 29.  1.

## 2021-11-10 NOTE — Assessment & Plan Note (Signed)
CKD stage II is stable.  She should be able to tolerate a potassium sparing diuretic in place of the chlorthalidone which is associated with mild hypokalemia.

## 2021-11-10 NOTE — Patient Instructions (Signed)
See assessment and plan under each diagnosis in the problem list and acutely for this visit 

## 2021-11-17 DIAGNOSIS — Z1159 Encounter for screening for other viral diseases: Secondary | ICD-10-CM | POA: Diagnosis not present

## 2021-11-17 DIAGNOSIS — K56699 Other intestinal obstruction unspecified as to partial versus complete obstruction: Secondary | ICD-10-CM | POA: Diagnosis not present

## 2021-11-24 DIAGNOSIS — K56699 Other intestinal obstruction unspecified as to partial versus complete obstruction: Secondary | ICD-10-CM | POA: Diagnosis not present

## 2021-11-24 DIAGNOSIS — Z1159 Encounter for screening for other viral diseases: Secondary | ICD-10-CM | POA: Diagnosis not present

## 2021-12-08 ENCOUNTER — Encounter: Payer: Self-pay | Admitting: Adult Health

## 2021-12-08 ENCOUNTER — Non-Acute Institutional Stay (SKILLED_NURSING_FACILITY): Payer: Medicare Other | Admitting: Adult Health

## 2021-12-08 DIAGNOSIS — Z933 Colostomy status: Secondary | ICD-10-CM | POA: Diagnosis not present

## 2021-12-08 DIAGNOSIS — J4540 Moderate persistent asthma, uncomplicated: Secondary | ICD-10-CM | POA: Diagnosis not present

## 2021-12-08 DIAGNOSIS — M810 Age-related osteoporosis without current pathological fracture: Secondary | ICD-10-CM

## 2021-12-08 DIAGNOSIS — E538 Deficiency of other specified B group vitamins: Secondary | ICD-10-CM

## 2021-12-08 DIAGNOSIS — D539 Nutritional anemia, unspecified: Secondary | ICD-10-CM | POA: Diagnosis not present

## 2021-12-08 NOTE — Progress Notes (Signed)
?Location:  [place] ?Nursing Home Room Number: 144-P ?Place of Service:  SNF (31) ? ? ?CODE STATUS: DNR ? ?No Known Allergies ? ?Chief Complaint  ?Patient presents with  ? Medical Management of Chronic Issues  ?                 Moderate persistent asthma in adult:  Macrocytic anemia:Post menopausal osteoporosis: Vitamin B 12 deficiency  ? ? ?HPI: ? ?She is a 88 year old long term resident of this facility being seen for the management of her chronic illnesses:  Moderate persistent asthma in adult:  Macrocytic anemia:Post menopausal osteoporosis: Vitamin B 12 deficiency. There are no repots of uncontrolled pain; no changes in appetite; no recent reports of cough of shortness of breath.  ? ?Past Medical History:  ?Diagnosis Date  ? Anxiety   ? Arthritis   ? Asthma   ? Cancer [facility])   ? breast  ? Depression   ? Diverticulosis   ? GERD (gastroesophageal reflux disease)   ? HTN (hypertension)   ? Shortness of breath   ? exertion  ? Sleep apnea   ? STOP BANG 4  ? Thyroid dysfunction   ? ? ?Past Surgical History:  ?Procedure Laterality Date  ? ABDOMINAL HYSTERECTOMY    ? arthroscopic surgery rt knee  1997  ? BREAST SURGERY  2001  ? LEFT/ CANCER   ? broken radius ulna  1990  ? left  ? broken right ankle  1976  ? EXPLORATORY LAPAROTOMY  08/24/2020  ? with partial sigmoidectomy creat of end colostomy and application of negative pressure dressing.     ? JOINT REPLACEMENT  right knee 2005 [facility]  ? left knee  2004  ? rt foot bone spur removed  1992  ? THYROID SURGERY  1983  ? TOTAL KNEE ARTHROPLASTY  01/22/2012  ? Procedure: TOTAL KNEE ARTHROPLASTY;  Surgeon: Carole Civil, MD;  Location: [facility];  Service: Orthopedics;  Laterality: Left;  ? TUBAL LIGATION    ? ? ?Social History  ? ?Socioeconomic History  ? Marital status: Widowed  ?  Spouse name: Not on file  ? Number of children: Not on file  ? Years of education: Not on file  ? Highest education level: Not on file  ?Occupational History  ? Not on file   ?Tobacco Use  ? Smoking status: Former  ? Smokeless tobacco: Former  ?Vaping Use  ? Vaping Use: Never used  ?Substance and Sexual Activity  ? Alcohol use: No  ? Drug use: No  ? Sexual activity: Never  ?Other Topics Concern  ? Not on file  ?Social History Narrative  ? Not on file  ? ?Social Determinants of Health  ? ?Financial Resource Strain: Not on file  ?Food Insecurity: Not on file  ?Transportation Needs: Not on file  ?Physical Activity: Not on file  ?Stress: Not on file  ?Social Connections: Not on file  ?Intimate Partner Violence: Not on file  ? ?Family History  ?Problem Relation Age of Onset  ? Pseudochol deficiency Neg Hx   ? Malignant hyperthermia Neg Hx   ? Hypotension Neg Hx   ? Anesthesia problems Neg Hx   ? ? ? ? ?VITAL SIGNS ?BP 118/68   Pulse 65   Temp 98.6 ?F (37 ?C)   Ht _0  (1.448 m)   Wt 186 lb (84.4 kg)   BMI 40.25 kg/m?  ? ?Outpatient Encounter Medications as of 12/08/2021  ?Medication Sig  ? acetaminophen (  TYLENOL) 500 MG tablet Take 500 mg by mouth every 8 (eight) hours as needed.  ? acetaminophen (TYLENOL) 500 MG tablet Take 500 mg by mouth at bedtime.  ? alendronate (FOSAMAX) 70 MG tablet Take 70 mg by mouth once a week. Every Sunday take with a full glass of water on an empty stomach.  ? atenolol (TENORMIN) 50 MG tablet Take 50 mg by mouth every evening.  ? BREO ELLIPTA 200-25 MCG/INH AEPB Inhale 1 puff into the lungs daily.  ? Calcium Carb-Cholecalciferol (CALCIUM 500 + D PO) Take 1 tablet by mouth daily.  ? chlorthalidone (HYGROTON) 25 MG tablet Take 25 mg by mouth daily.  ? cholecalciferol (VITAMIN D) 1000 UNITS tablet Take 2,000 Units by mouth every morning.  ? Cyanocobalamin 1000 MCG/ML KIT Inject as directed. Once a day on Saturday from 09/04/2020-10/02/2020. Then take one injection once a month starting 11/04/2020  ? guaiFENesin-dextromethorphan (ROBITUSSIN DM) 100-10 MG/5ML syrup Take 5 mLs by mouth every 6 (six) hours as needed for cough (and congestion).  ? loratadine  (CLARITIN) 10 MG tablet Take 10 mg by mouth daily.  ? melatonin 3 MG TABS tablet Take 3 mg by mouth at bedtime.  ? Multiple Vitamins-Minerals (PRESERVISION AREDS 2) CAPS Take 1 capsule by mouth in the morning and at bedtime.  ? NON FORMULARY Diet:Regular  ? nystatin (MYCOSTATIN/NYSTOP) powder Apply 1 application topically daily as needed. Special Instructions: Apply thin layer of Nystatin powder to skin around stoma when changing colostomy bag PRN irritation.  ? pantoprazole (PROTONIX) 40 MG tablet Take 40 mg by mouth 2 (two) times daily.  ? phenol (CHLORASEPTIC WARM SORE THROAT) 1.4 % LIQD 1 spray every 12 (twelve) hours. mucous membrane ?Sore throat  ? potassium chloride SA (KLOR-CON) 20 MEQ tablet Take 40 mEq by mouth 2 (two) times daily.  ? [DISCONTINUED] DULoxetine (CYMBALTA) 20 MG capsule Take 20 mg by mouth daily.  ? ?No facility-administered encounter medications on file as of 12/08/2021.  ? ? ? ?SIGNIFICANT DIAGNOSTIC EXAMS ? ?PREVIOUS  ? ?03-02-21: DEXA: t score -3.258 ? ?NO NEW LABS.  ? ?LABS REVIEWED PREVIOUS ? ?01-06-21: wb 6.6; hgb 14.5; hct 44.2; mcv 102.1 plt 257; glucose 117; bun 28; creat 0.63; k+ 3.1; na++ 138; ca 9.5 GFR>60; live normal albumin 3.6 ?02-10-21: glucose 89; bun 27; creat 0.66; k+ 2.8; na++ 138; ca 9.2 GFR>60 ?02-11-21: k+ 4.6  ?04-07-21: wbc 6.8; hgb 13.9; hct 42.2; mcv 104.5 plt 228; glucose 125; bun 25; creat 0.70; k+ 3.6; na++ 137; ca 9.1; GFR>60; d-dimer 0.55; CRP 2.3  ?06-02-21: vitamin B12: 447 folate 29.1 ? ?TODAY ? ?10-17-21: wbc 5.9; hgb 14.9; hct 44.3; mcv 104.5 plt 267; glucose 97; bun 14; creat 0.66; k+ 3.4; na++ 137; ca 9.1; GFR>60; protein 7.3; albumin 3.8  ? ? ?Review of Systems  ?Unable to perform ROS: Dementia  ? ? ?Physical Exam ?Constitutional:   ?   General: She is not in acute distress. ?   Appearance: She is well-developed. She is not diaphoretic.  ?Neck:  ?   Thyroid: No thyromegaly.  ?Cardiovascular:  ?   Rate and Rhythm: Normal rate and regular rhythm.  ?   Pulses:  Normal pulses.  ?   Heart sounds: Normal heart sounds.  ?Pulmonary:  ?   Effort: Pulmonary effort is normal. No respiratory distress.  ?   Breath sounds: Normal breath sounds.  ?Abdominal:  ?   General: Bowel sounds are normal. There is no distension.  ?   Palpations: Abdomen  is soft.  ?   Tenderness: There is no abdominal tenderness.  ?   Comments: Colostomy   ?Musculoskeletal:     ?   General: Normal range of motion.  ?   Cervical back: Neck supple.  ?   Right lower leg: No edema.  ?   Left lower leg: No edema.  ?   Comments: Scoliosis; kyphosis    ?Lymphadenopathy:  ?   Cervical: No cervical adenopathy.  ?Skin: ?   General: Skin is warm and dry.  ?Neurological:  ?   Mental Status: She is alert. Mental status is at baseline.  ?Psychiatric:     ?   Mood and Affect: Mood normal.  ? ? ?ASSESSMENT/ PLAN: ? ?TODAY ? ?Moderate persistent asthma in adult: stable will continue breo ellipta 200/25 mcg 1 puff daily  ? ?2. Macrocytic anemia: hgb 14.9 will monitor ? ?3. Post menopausal osteoporosis: t score -3.253 will continue fosamax 70 mg weekly  ? ?4. Vitamin B 12 deficiency: level is 446 will continue monthly injections.  ? ?PREVIOUS  ? ?5. Non-seasonal allergic rhinitis unspecified trigger: is stable will continue claritin 10 mg daily flonase daily will monitor  ? ?6. GERD without esophagitis: is stable will continue protonix 40 mg twice daily  ? ?7. Essential hypertension: is stable b/p 118/67 will continue tenormin 50 mg daily and hygroton 25 mg daily  ? ?8  Hypokalemia: is stable k+ 3.4 will continue k+ 40 meq twice daily  ? ?9. Major depression recurrent chronic: is stable will continue cymbalta 40 mg daily melatonin 3 mg nightly not on ativan ? ?10. Aortoiliac atherosclerosis: (ct 08-21-20) will monitor  ? ?11. Large bowel obstruction/ s/p colostomy: will monitor ? ?12. Degenerative disc disease lumbar: is stable will continue cymbalta 40 mg daily  ? ? ? ?Ok Edwards NP ?[facility]  ? call  [phone]  ? ?

## 2021-12-09 ENCOUNTER — Non-Acute Institutional Stay (SKILLED_NURSING_FACILITY): Payer: Medicare Other | Admitting: Adult Health

## 2021-12-09 ENCOUNTER — Encounter: Payer: Self-pay | Admitting: Adult Health

## 2021-12-09 DIAGNOSIS — I10 Essential (primary) hypertension: Secondary | ICD-10-CM | POA: Diagnosis not present

## 2021-12-09 DIAGNOSIS — Z933 Colostomy status: Secondary | ICD-10-CM

## 2021-12-09 DIAGNOSIS — F339 Major depressive disorder, recurrent, unspecified: Secondary | ICD-10-CM

## 2021-12-09 DIAGNOSIS — I708 Atherosclerosis of other arteries: Secondary | ICD-10-CM

## 2021-12-09 DIAGNOSIS — I700 Atherosclerosis of aorta: Secondary | ICD-10-CM | POA: Diagnosis not present

## 2021-12-09 NOTE — Progress Notes (Signed)
?Location:  [place] ?Nursing Home Room Number: 144-P ?Place of Service:  SNF (31) ? ? ?CODE STATUS: DNR ? ?No Known Allergies ? ?Chief Complaint  ?Patient presents with  ? Acute Visit  ?  Care plan meeting  ? ? ?HPI: ? ?We have come together for her care plan meeting. BIMS 15/15 mood 4/30: decreased energy anxious. No falls uses wheelchair. She requires limited to extensive assist . She is frequently incontinent of bladder has colostomy. Dietary: regular diet feeds self. Weight is 186.4 pounds up 2 pounds in the past quarter. No therapy at this time. She continues to be followed for her chronic illnesses including:  Aortic-iliac atherosclerosis  Colostomy status  Major depression recurrent chronic Essential hypertension ? ?Past Medical History:  ?Diagnosis Date  ? Anxiety   ? Arthritis   ? Asthma   ? Cancer [facility])   ? breast  ? Depression   ? Diverticulosis   ? GERD (gastroesophageal reflux disease)   ? HTN (hypertension)   ? Shortness of breath   ? exertion  ? Sleep apnea   ? STOP BANG 4  ? Thyroid dysfunction   ? ? ?Past Surgical History:  ?Procedure Laterality Date  ? ABDOMINAL HYSTERECTOMY    ? arthroscopic surgery rt knee  1997  ? BREAST SURGERY  2001  ? LEFT/ CANCER   ? broken radius ulna  1990  ? left  ? broken right ankle  1976  ? EXPLORATORY LAPAROTOMY  08/24/2020  ? with partial sigmoidectomy creat of end colostomy and application of negative pressure dressing.     ? JOINT REPLACEMENT  right knee 2005 [place]  ? left knee  2004  ? rt foot bone spur removed  1992  ? THYROID SURGERY  1983  ? TOTAL KNEE ARTHROPLASTY  01/22/2012  ? Procedure: TOTAL KNEE ARTHROPLASTY;  Surgeon: Carole Civil, MD;  Location: [facility];  Service: Orthopedics;  Laterality: Left;  ? TUBAL LIGATION    ? ? ?Social History  ? ?Socioeconomic History  ? Marital status: Widowed  ?  Spouse name: Not on file  ? Number of children: Not on file  ? Years of education: Not on file  ? Highest education level: Not on file   ?Occupational History  ? Not on file  ?Tobacco Use  ? Smoking status: Former  ? Smokeless tobacco: Former  ?Vaping Use  ? Vaping Use: Never used  ?Substance and Sexual Activity  ? Alcohol use: No  ? Drug use: No  ? Sexual activity: Never  ?Other Topics Concern  ? Not on file  ?Social History Narrative  ? Not on file  ? ?Social Determinants of Health  ? ?Financial Resource Strain: Not on file  ?Food Insecurity: Not on file  ?Transportation Needs: Not on file  ?Physical Activity: Not on file  ?Stress: Not on file  ?Social Connections: Not on file  ?Intimate Partner Violence: Not on file  ? ?Family History  ?Problem Relation Age of Onset  ? Pseudochol deficiency Neg Hx   ? Malignant hyperthermia Neg Hx   ? Hypotension Neg Hx   ? Anesthesia problems Neg Hx   ? ? ? ? ?VITAL SIGNS ?BP 118/68   Pulse 65   Temp 98.6 ?F (37 ?C)   Ht _0  (1.448 m)   Wt 186 lb 6.4 oz (84.6 kg)   SpO2 96%   BMI 40.34 kg/m?  ? ?Outpatient Encounter Medications as of 12/09/2021  ?Medication Sig  ? acetaminophen (TYLENOL)  500 MG tablet Take 500 mg by mouth every 8 (eight) hours as needed.  ? acetaminophen (TYLENOL) 500 MG tablet Take 500 mg by mouth at bedtime.  ? alendronate (FOSAMAX) 70 MG tablet Take 70 mg by mouth once a week. Every Sunday take with a full glass of water on an empty stomach.  ? atenolol (TENORMIN) 50 MG tablet Take 50 mg by mouth every evening.  ? BREO ELLIPTA 200-25 MCG/INH AEPB Inhale 1 puff into the lungs daily.  ? Calcium Carb-Cholecalciferol (CALCIUM 500 + D PO) Take 1 tablet by mouth daily.  ? chlorthalidone (HYGROTON) 25 MG tablet Take 25 mg by mouth daily.  ? cholecalciferol (VITAMIN D) 1000 UNITS tablet Take 2,000 Units by mouth every morning.  ? Cyanocobalamin 1000 MCG/ML KIT Inject as directed. Once A Day on the 10th of the Month  ? guaiFENesin-dextromethorphan (ROBITUSSIN DM) 100-10 MG/5ML syrup Take 15 mLs by mouth every 6 (six) hours as needed for cough (and congestion).  ? loratadine (CLARITIN) 10 MG  tablet Take 10 mg by mouth daily.  ? melatonin 3 MG TABS tablet Take 3 mg by mouth at bedtime.  ? Multiple Vitamins-Minerals (PRESERVISION AREDS 2) CAPS Take 1 capsule by mouth in the morning and at bedtime.  ? NON FORMULARY Diet:Regular  ? nystatin (MYCOSTATIN/NYSTOP) powder Apply 1 application topically daily as needed. Special Instructions: Apply thin layer of Nystatin powder to skin around stoma when changing colostomy bag PRN irritation.  ? pantoprazole (PROTONIX) 40 MG tablet Take 40 mg by mouth 2 (two) times daily.  ? phenol (CHLORASEPTIC WARM SORE THROAT) 1.4 % LIQD 1 spray every 12 (twelve) hours. mucous membrane ?Sore throat  ? potassium chloride SA (KLOR-CON) 20 MEQ tablet Take 40 mEq by mouth 2 (two) times daily.  ? ?No facility-administered encounter medications on file as of 12/09/2021.  ? ? ? ?SIGNIFICANT DIAGNOSTIC EXAMS ? ?PREVIOUS  ? ?03-02-21: DEXA: t score -3.258 ? ?NO NEW LABS.  ? ?LABS REVIEWED PREVIOUS ? ?01-06-21: wb 6.6; hgb 14.5; hct 44.2; mcv 102.1 plt 257; glucose 117; bun 28; creat 0.63; k+ 3.1; na++ 138; ca 9.5 GFR>60; live normal albumin 3.6 ?02-10-21: glucose 89; bun 27; creat 0.66; k+ 2.8; na++ 138; ca 9.2 GFR>60 ?02-11-21: k+ 4.6  ?04-07-21: wbc 6.8; hgb 13.9; hct 42.2; mcv 104.5 plt 228; glucose 125; bun 25; creat 0.70; k+ 3.6; na++ 137; ca 9.1; GFR>60; d-dimer 0.55; CRP 2.3  ?06-02-21: vitamin B12: 447 folate 29.1 ?10-17-21: wbc 5.9; hgb 14.9; hct 44.3; mcv 104.5 plt 267; glucose 97; bun 14; creat 0.66; k+ 3.4; na++ 137; ca 9.1; GFR>60; protein 7.3; albumin 3.8 ? ?NO NEW LABS.   ? ?Review of Systems  ?Constitutional:  Negative for malaise/fatigue.  ?Respiratory:  Negative for cough and shortness of breath.   ?Cardiovascular:  Negative for chest pain, palpitations and leg swelling.  ?Gastrointestinal:  Negative for abdominal pain, constipation and heartburn.  ?Musculoskeletal:  Negative for back pain, joint pain and myalgias.  ?Skin: Negative.   ?Neurological:  Negative for dizziness.   ?Psychiatric/Behavioral:  The patient is not nervous/anxious.   ? ? ?Physical Exam ?Constitutional:   ?   General: She is not in acute distress. ?   Appearance: She is well-developed. She is not diaphoretic.  ?Neck:  ?   Thyroid: No thyromegaly.  ?Cardiovascular:  ?   Rate and Rhythm: Normal rate and regular rhythm.  ?   Heart sounds: Normal heart sounds.  ?Pulmonary:  ?   Effort: Pulmonary effort is  normal. No respiratory distress.  ?   Breath sounds: Normal breath sounds.  ?Abdominal:  ?   General: Bowel sounds are normal. There is no distension.  ?   Palpations: Abdomen is soft.  ?   Tenderness: There is no abdominal tenderness.  ?   Comments: colostomy  ?Musculoskeletal:  ?   Cervical back: Neck supple.  ?   Right lower leg: No edema.  ?   Left lower leg: No edema.  ?   Comments: Scoliosis; kyphosis   ?Lymphadenopathy:  ?   Cervical: No cervical adenopathy.  ?Skin: ?   General: Skin is warm and dry.  ?Neurological:  ?   Mental Status: She is alert. Mental status is at baseline.  ?Psychiatric:     ?   Mood and Affect: Mood normal.  ? ? ? ?ASSESSMENT/ PLAN: ? ?TODAY ? ?Aortic-iliac atherosclerosis ?Colostomy status ?Major depression recurrent chronic ?Essential hypertension ? ?Will continue current medications ?Will continue current plan of care ?Will continue to monitor her status.  ? ?Time spent with patient 40 minutes:goals of care; care plan; medications.  ? ? ?Ok Edwards NP ?[facility]  ?call [phone]  ? ?

## 2021-12-12 DIAGNOSIS — E538 Deficiency of other specified B group vitamins: Secondary | ICD-10-CM | POA: Insufficient documentation

## 2022-01-01 DIAGNOSIS — Z1159 Encounter for screening for other viral diseases: Secondary | ICD-10-CM | POA: Diagnosis not present

## 2022-01-01 DIAGNOSIS — K56699 Other intestinal obstruction unspecified as to partial versus complete obstruction: Secondary | ICD-10-CM | POA: Diagnosis not present

## 2022-01-03 ENCOUNTER — Encounter: Payer: Self-pay | Admitting: Adult Health

## 2022-01-03 ENCOUNTER — Non-Acute Institutional Stay (SKILLED_NURSING_FACILITY): Payer: Medicare Other | Admitting: Adult Health

## 2022-01-03 DIAGNOSIS — K219 Gastro-esophageal reflux disease without esophagitis: Secondary | ICD-10-CM | POA: Diagnosis not present

## 2022-01-03 DIAGNOSIS — I10 Essential (primary) hypertension: Secondary | ICD-10-CM

## 2022-01-03 DIAGNOSIS — E876 Hypokalemia: Secondary | ICD-10-CM

## 2022-01-03 DIAGNOSIS — J3089 Other allergic rhinitis: Secondary | ICD-10-CM | POA: Diagnosis not present

## 2022-01-03 NOTE — Progress Notes (Signed)
? ?Location:  [place] ?Nursing Home Room Number: 144 ?Place of Service:  SNF (31) ? ? ?CODE STATUS: dnr  ? ?No Known Allergies ? ?Chief Complaint  ?Patient presents with  ? Medical Management of Chronic Issues ? ?                Non-seasonal allergic rhinitis unspecified trigger; GERD without esophagitis: Essential hypertension:  Hypokalemia  ? ? ?HPI: ? ?She is a 88 year old long term resident of this facility being seen for the management of her chronic illnesses:     Non-seasonal allergic rhinitis unspecified trigger; GERD without esophagitis: Essential hypertension:  Hypokalemia. There are no reports of uncontrolled pain. She has chronic scratchy throat. No reports of anxiety or depressive thoughts.  ? ?Past Medical History:  ?Diagnosis Date  ? Anxiety   ? Arthritis   ? Asthma   ? Cancer [facility])   ? breast  ? Depression   ? Diverticulosis   ? GERD (gastroesophageal reflux disease)   ? HTN (hypertension)   ? Shortness of breath   ? exertion  ? Sleep apnea   ? STOP BANG 4  ? Thyroid dysfunction   ? ? ?Past Surgical History:  ?Procedure Laterality Date  ? ABDOMINAL HYSTERECTOMY    ? arthroscopic surgery rt knee  1997  ? BREAST SURGERY  2001  ? LEFT/ CANCER   ? broken radius ulna  1990  ? left  ? broken right ankle  1976  ? EXPLORATORY LAPAROTOMY  08/24/2020  ? with partial sigmoidectomy creat of end colostomy and application of negative pressure dressing.     ? JOINT REPLACEMENT  right knee 2005 [place]  ? left knee  2004  ? rt foot bone spur removed  1992  ? THYROID SURGERY  1983  ? TOTAL KNEE ARTHROPLASTY  01/22/2012  ? Procedure: TOTAL KNEE ARTHROPLASTY;  Surgeon: Carole Civil, MD;  Location: [facility];  Service: Orthopedics;  Laterality: Left;  ? TUBAL LIGATION    ? ? ?Social History  ? ?Socioeconomic History  ? Marital status: Widowed  ?  Spouse name: Not on file  ? Number of children: Not on file  ? Years of education: Not on file  ? Highest education level: Not on file  ?Occupational History  ?  Not on file  ?Tobacco Use  ? Smoking status: Former  ? Smokeless tobacco: Former  ?Vaping Use  ? Vaping Use: Never used  ?Substance and Sexual Activity  ? Alcohol use: No  ? Drug use: No  ? Sexual activity: Never  ?Other Topics Concern  ? Not on file  ?Social History Narrative  ? Not on file  ? ?Social Determinants of Health  ? ?Financial Resource Strain: Not on file  ?Food Insecurity: Not on file  ?Transportation Needs: Not on file  ?Physical Activity: Not on file  ?Stress: Not on file  ?Social Connections: Not on file  ?Intimate Partner Violence: Not on file  ? ?Family History  ?Problem Relation Age of Onset  ? Pseudochol deficiency Neg Hx   ? Malignant hyperthermia Neg Hx   ? Hypotension Neg Hx   ? Anesthesia problems Neg Hx   ? ? ? ? ?VITAL SIGNS ?BP 123/62   Pulse 64   Temp 97.8 ?F (36.6 ?C)   Resp 18   Ht $R'4\' 9"'oY$  (1.448 m)   Wt 187 lb (84.8 kg)   BMI 40.47 kg/m?  ? ?Outpatient Encounter Medications as of 01/03/2022  ?Medication Sig  ?  acetaminophen (TYLENOL) 500 MG tablet Take 500 mg by mouth every 8 (eight) hours as needed.  ? acetaminophen (TYLENOL) 500 MG tablet Take 500 mg by mouth at bedtime.  ? alendronate (FOSAMAX) 70 MG tablet Take 70 mg by mouth once a week. Every Sunday take with a full glass of water on an empty stomach.  ? atenolol (TENORMIN) 50 MG tablet Take 50 mg by mouth every evening.  ? BREO ELLIPTA 200-25 MCG/INH AEPB Inhale 1 puff into the lungs daily.  ? Calcium Carb-Cholecalciferol (CALCIUM 500 + D PO) Take 1 tablet by mouth daily.  ? chlorthalidone (HYGROTON) 25 MG tablet Take 25 mg by mouth daily.  ? cholecalciferol (VITAMIN D) 1000 UNITS tablet Take 2,000 Units by mouth every morning.  ? Cyanocobalamin 1000 MCG/ML KIT Inject as directed. Once A Day on the 10th of the Month  ? guaiFENesin-dextromethorphan (ROBITUSSIN DM) 100-10 MG/5ML syrup Take 15 mLs by mouth every 6 (six) hours as needed for cough (and congestion).  ? loratadine (CLARITIN) 10 MG tablet Take 10 mg by mouth daily.   ? melatonin 3 MG TABS tablet Take 3 mg by mouth at bedtime.  ? Multiple Vitamins-Minerals (PRESERVISION AREDS 2) CAPS Take 1 capsule by mouth in the morning and at bedtime.  ? NON FORMULARY Diet:Regular  ? nystatin (MYCOSTATIN/NYSTOP) powder Apply 1 application topically daily as needed. Special Instructions: Apply thin layer of Nystatin powder to skin around stoma when changing colostomy bag PRN irritation.  ? pantoprazole (PROTONIX) 40 MG tablet Take 40 mg by mouth 2 (two) times daily.  ? phenol (CHLORASEPTIC WARM SORE THROAT) 1.4 % LIQD 1 spray every 12 (twelve) hours. mucous membrane ?Sore throat  ? potassium chloride SA (KLOR-CON) 20 MEQ tablet Take 40 mEq by mouth 2 (two) times daily.  ? ?No facility-administered encounter medications on file as of 01/03/2022.  ? ? ? ?SIGNIFICANT DIAGNOSTIC EXAMS ? ?PREVIOUS  ? ?03-02-21: DEXA: t score -3.258 ? ?NO NEW LABS.  ? ?LABS REVIEWED PREVIOUS ? ?01-06-21: wb 6.6; hgb 14.5; hct 44.2; mcv 102.1 plt 257; glucose 117; bun 28; creat 0.63; k+ 3.1; na++ 138; ca 9.5 GFR>60; live normal albumin 3.6 ?02-10-21: glucose 89; bun 27; creat 0.66; k+ 2.8; na++ 138; ca 9.2 GFR>60 ?02-11-21: k+ 4.6  ?04-07-21: wbc 6.8; hgb 13.9; hct 42.2; mcv 104.5 plt 228; glucose 125; bun 25; creat 0.70; k+ 3.6; na++ 137; ca 9.1; GFR>60; d-dimer 0.55; CRP 2.3  ?06-02-21: vitamin B12: 447 folate 29.1 ?10-17-21: wbc 5.9; hgb 14.9; hct 44.3; mcv 104.5 plt 267; glucose 97; bun 14; creat 0.66; k+ 3.4; na++ 137; ca 9.1; GFR>60; protein 7.3; albumin 3.8  ? ?NO NEW LABS.  ? ?Review of Systems  ?Unable to perform ROS: Dementia  ? ?Physical Exam ?Constitutional:   ?   General: She is not in acute distress. ?   Appearance: She is well-developed. She is not diaphoretic.  ?Neck:  ?   Thyroid: No thyromegaly.  ?Cardiovascular:  ?   Rate and Rhythm: Normal rate and regular rhythm.  ?   Pulses: Normal pulses.  ?   Heart sounds: Normal heart sounds.  ?Pulmonary:  ?   Effort: Pulmonary effort is normal. No respiratory  distress.  ?   Breath sounds: Normal breath sounds.  ?Abdominal:  ?   General: Bowel sounds are normal. There is no distension.  ?   Palpations: Abdomen is soft.  ?   Tenderness: There is no abdominal tenderness.  ?   Comments: Colostomy   ?  Musculoskeletal:     ?   General: Normal range of motion.  ?   Cervical back: Neck supple.  ?   Right lower leg: No edema.  ?   Left lower leg: No edema.  ?   Comments: Scoliosis; kyphosis   ?Lymphadenopathy:  ?   Cervical: No cervical adenopathy.  ?Skin: ?   General: Skin is warm and dry.  ?Neurological:  ?   Mental Status: She is alert. Mental status is at baseline.  ?Psychiatric:     ?   Mood and Affect: Mood normal.  ? ? ? ? ?ASSESSMENT/ PLAN: ? ?TODAY ? ?Non-seasonal allergic rhinitis unspecified trigger; will continue claritin 10 mg daily  ? ?2. GERD without esophagitis: is stable will continue protonix 40 mg twice dail y ? ?3. Essential hypertension: is stable. 123/62 will continue tenormin 50 mg daily and hygroton 25 mg daily  ? ?4. Hypokalemia: k+ 3.4 will continue k+ 40 meq twice daily  ? ? ?PREVIOUS  ? ?5. Major depression recurrent chronic: is stable will continue cymbalta 40 mg daily melatonin 3 mg nightly not on ativan ? ?6. Aortoiliac atherosclerosis: (ct 08-21-20) will monitor  ? ?7. Large bowel obstruction/ s/p colostomy: will monitor ? ?8. Degenerative disc disease lumbar: is stable will continue cymbalta 40 mg daily  ? ?9. Moderate persistent asthma in adult: stable will continue breo ellipta 200/25 mcg 1 puff daily  ? ?10. Macrocytic anemia: hgb 14.9 will monitor ? ?11. Post menopausal osteoporosis: t score -3.253 will continue fosamax 70 mg weekly  ? ?12. Vitamin B 12 deficiency: level is 446 will continue monthly injections.  ? ? ? ? ?Ok Edwards NP ?[facility]  ?call [phone]  ? ?

## 2022-01-04 ENCOUNTER — Encounter: Payer: Self-pay | Admitting: Adult Health

## 2022-01-04 ENCOUNTER — Non-Acute Institutional Stay (SKILLED_NURSING_FACILITY): Payer: Medicare Other | Admitting: Adult Health

## 2022-01-04 DIAGNOSIS — J4540 Moderate persistent asthma, uncomplicated: Secondary | ICD-10-CM | POA: Diagnosis not present

## 2022-01-04 DIAGNOSIS — R0689 Other abnormalities of breathing: Secondary | ICD-10-CM | POA: Diagnosis not present

## 2022-01-04 DIAGNOSIS — R059 Cough, unspecified: Secondary | ICD-10-CM | POA: Diagnosis not present

## 2022-01-04 NOTE — Progress Notes (Signed)
?Location:  [place] ?Nursing Home Room Number: 144 p ?Place of Service:  SNF (31) ?Provider:  Ok Edwards, NP ? ? ?CODE STATUS: DNR ? ?No Known Allergies ? ?Chief Complaint  ?Patient presents with  ? Acute Visit  ?  Cough  ? ? ?HPI: ? ?Staff reports that she has a worsening dry cough. There are no reports of increased shortness of breath. No sputum production. No reports of fevers. She does take breo daily.  ? ?Past Medical History:  ?Diagnosis Date  ? Anxiety   ? Arthritis   ? Asthma   ? Cancer [facility])   ? breast  ? Depression   ? Diverticulosis   ? GERD (gastroesophageal reflux disease)   ? HTN (hypertension)   ? Shortness of breath   ? exertion  ? Sleep apnea   ? STOP BANG 4  ? Thyroid dysfunction   ? ? ?Past Surgical History:  ?Procedure Laterality Date  ? ABDOMINAL HYSTERECTOMY    ? arthroscopic surgery rt knee  1997  ? BREAST SURGERY  2001  ? LEFT/ CANCER   ? broken radius ulna  1990  ? left  ? broken right ankle  1976  ? EXPLORATORY LAPAROTOMY  08/24/2020  ? with partial sigmoidectomy creat of end colostomy and application of negative pressure dressing.     ? JOINT REPLACEMENT  right knee 2005 [place]  ? left knee  2004  ? rt foot bone spur removed  1992  ? THYROID SURGERY  1983  ? TOTAL KNEE ARTHROPLASTY  01/22/2012  ? Procedure: TOTAL KNEE ARTHROPLASTY;  Surgeon: Carole Civil, MD;  Location: [facility];  Service: Orthopedics;  Laterality: Left;  ? TUBAL LIGATION    ? ? ?Social History  ? ?Socioeconomic History  ? Marital status: Widowed  ?  Spouse name: Not on file  ? Number of children: Not on file  ? Years of education: Not on file  ? Highest education level: Not on file  ?Occupational History  ? Not on file  ?Tobacco Use  ? Smoking status: Former  ? Smokeless tobacco: Former  ?Vaping Use  ? Vaping Use: Never used  ?Substance and Sexual Activity  ? Alcohol use: No  ? Drug use: No  ? Sexual activity: Never  ?Other Topics Concern  ? Not on file  ?Social History Narrative  ? Not on file   ? ?Social Determinants of Health  ? ?Financial Resource Strain: Not on file  ?Food Insecurity: Not on file  ?Transportation Needs: Not on file  ?Physical Activity: Not on file  ?Stress: Not on file  ?Social Connections: Not on file  ?Intimate Partner Violence: Not on file  ? ?Family History  ?Problem Relation Age of Onset  ? Pseudochol deficiency Neg Hx   ? Malignant hyperthermia Neg Hx   ? Hypotension Neg Hx   ? Anesthesia problems Neg Hx   ? ? ? ? ?VITAL SIGNS ?BP 123/62   Pulse 65   Temp (!) 97.4 ?F (36.3 ?C)   Resp 18   Ht '4\' 9"'  (1.448 m)   Wt 187 lb (84.8 kg)   SpO2 96%   BMI 40.47 kg/m?  ? ?Outpatient Encounter Medications as of 01/04/2022  ?Medication Sig  ? acetaminophen (TYLENOL) 500 MG tablet Take 500 mg by mouth every 8 (eight) hours as needed.  ? acetaminophen (TYLENOL) 500 MG tablet Take 500 mg by mouth at bedtime.  ? Albuterol Sulfate (PROAIR RESPICLICK) 161 (90 Base) MCG/ACT AEPB Inhale into  the lungs.  ? alendronate (FOSAMAX) 70 MG tablet Take 70 mg by mouth once a week. Every Sunday take with a full glass of water on an empty stomach.  ? atenolol (TENORMIN) 50 MG tablet Take 50 mg by mouth every evening.  ? BREO ELLIPTA 200-25 MCG/INH AEPB Inhale 1 puff into the lungs daily.  ? Calcium Carb-Cholecalciferol (CALCIUM 500 + D PO) Take 1 tablet by mouth daily.  ? chlorthalidone (HYGROTON) 25 MG tablet Take 25 mg by mouth daily.  ? cholecalciferol (VITAMIN D) 1000 UNITS tablet Take 2,000 Units by mouth every morning.  ? Cyanocobalamin 1000 MCG/ML KIT Inject as directed. Once A Day on the 10th of the Month  ? guaiFENesin-dextromethorphan (ROBITUSSIN DM) 100-10 MG/5ML syrup Take 15 mLs by mouth every 6 (six) hours as needed for cough (and congestion).  ? loratadine (CLARITIN) 10 MG tablet Take 10 mg by mouth daily.  ? melatonin 3 MG TABS tablet Take 3 mg by mouth at bedtime.  ? Multiple Vitamins-Minerals (PRESERVISION AREDS 2) CAPS Take 1 capsule by mouth in the morning and at bedtime.  ? NON  FORMULARY Diet:Regular  ? nystatin (MYCOSTATIN/NYSTOP) powder Apply 1 application topically daily as needed. Special Instructions: Apply thin layer of Nystatin powder to skin around stoma when changing colostomy bag PRN irritation.  ? pantoprazole (PROTONIX) 40 MG tablet Take 40 mg by mouth 2 (two) times daily.  ? phenol (CHLORASEPTIC WARM SORE THROAT) 1.4 % LIQD 1 spray every 12 (twelve) hours. mucous membrane ?Sore throat  ? potassium chloride SA (KLOR-CON) 20 MEQ tablet Take 40 mEq by mouth 2 (two) times daily.  ? ?No facility-administered encounter medications on file as of 01/04/2022.  ? ? ? ?SIGNIFICANT DIAGNOSTIC EXAMS ? ? ?PREVIOUS  ? ?03-02-21: DEXA: t score -3.258 ? ?NO NEW LABS.  ? ?LABS REVIEWED PREVIOUS ? ?01-06-21: wb 6.6; hgb 14.5; hct 44.2; mcv 102.1 plt 257; glucose 117; bun 28; creat 0.63; k+ 3.1; na++ 138; ca 9.5 GFR>60; live normal albumin 3.6 ?02-10-21: glucose 89; bun 27; creat 0.66; k+ 2.8; na++ 138; ca 9.2 GFR>60 ?02-11-21: k+ 4.6  ?04-07-21: wbc 6.8; hgb 13.9; hct 42.2; mcv 104.5 plt 228; glucose 125; bun 25; creat 0.70; k+ 3.6; na++ 137; ca 9.1; GFR>60; d-dimer 0.55; CRP 2.3  ?06-02-21: vitamin B12: 447 folate 29.1 ?10-17-21: wbc 5.9; hgb 14.9; hct 44.3; mcv 104.5 plt 267; glucose 97; bun 14; creat 0.66; k+ 3.4; na++ 137; ca 9.1; GFR>60; protein 7.3; albumin 3.8  ? ?NO NEW LABS.  ? ?Review of Systems  ?Unable to perform ROS: Dementia  ? ? ?Physical Exam ?Constitutional:   ?   General: She is not in acute distress. ?   Appearance: She is well-developed. She is not diaphoretic.  ?Neck:  ?   Thyroid: No thyromegaly.  ?Cardiovascular:  ?   Rate and Rhythm: Normal rate and regular rhythm.  ?   Heart sounds: Normal heart sounds.  ?Pulmonary:  ?   Effort: Pulmonary effort is normal. No respiratory distress.  ?   Breath sounds: Rhonchi present.  ?Abdominal:  ?   General: Bowel sounds are normal. There is no distension.  ?   Palpations: Abdomen is soft.  ?   Tenderness: There is no abdominal tenderness.  ?    Comments: Colostomy   ?Musculoskeletal:     ?   General: Normal range of motion.  ?   Cervical back: Neck supple.  ?   Right lower leg: No edema.  ?  Left lower leg: No edema.  ?   Comments: Scoliosis; kyphosis  ?Lymphadenopathy:  ?   Cervical: No cervical adenopathy.  ?Skin: ?   General: Skin is warm and dry.  ?Neurological:  ?   Mental Status: She is alert. Mental status is at baseline.  ?Psychiatric:     ?   Mood and Affect: Mood normal.  ? ? ? ?ASSESSMENT/ PLAN: ? ?TODAY ? ?Moderate persistent asthma without complication:  ? ?Will begin albuterol 2 puffs every 6 hours for 10 days. Will monitor  ? ? ?Ok Edwards NP ?[facility]  ? call [phone]  ? ?

## 2022-01-12 ENCOUNTER — Non-Acute Institutional Stay (SKILLED_NURSING_FACILITY): Payer: Medicare Other | Admitting: Adult Health

## 2022-01-12 ENCOUNTER — Encounter: Payer: Self-pay | Admitting: Adult Health

## 2022-01-12 DIAGNOSIS — Z0000 Encounter for general adult medical examination without abnormal findings: Secondary | ICD-10-CM

## 2022-01-12 NOTE — Progress Notes (Signed)
? ?Subjective:  ? [name] is a 88 y.o. female who presents for Medicare Annual (Subsequent) preventive examination. ? ?Review of Systems    ?Review of Systems  ?Unable to perform ROS: Dementia  ? ?Cardiac Risk Factors include: advanced age (>30mn, >>67women);obesity (BMI >30kg/m2);sedentary lifestyle ? ?   ?Objective:  ?  ?Today's Vitals  ? 01/12/22 1107  ?BP: 140/68  ?Pulse: 74  ?Temp: (!) 97.2 ?F (36.2 ?C)  ?SpO2: 96%  ?Weight: 187 lb (84.8 kg)  ?Height: '4\' 9"'  (1.448 m)  ? ?Body mass index is 40.47 kg/m?. ? ? ?  01/12/2022  ? 11:11 AM 01/04/2022  ?  2:43 PM 01/03/2022  ? 11:41 AM 12/09/2021  ?  9:20 AM 12/08/2021  ? 11:06 AM 09/20/2021  ?  2:37 PM 09/09/2021  ? 11:40 AM  ?Advanced Directives  ?Does Patient Have a Medical Advance Directive? Yes Yes Yes Yes Yes Yes Yes  ?Type of Advance Directive Out of facility DNR (pink MOST or yellow form) Out of facility DNR (pink MOST or yellow form) Out of facility DNR (pink MOST or yellow form) Out of facility DNR (pink MOST or yellow form) Out of facility DNR (pink MOST or yellow form) Out of facility DNR (pink MOST or yellow form) Out of facility DNR (pink MOST or yellow form)  ?Does patient want to make changes to medical advance directive? No - Patient declined  No - Patient declined No - Patient declined No - Patient declined No - Patient declined No - Patient declined  ?Pre-existing out of facility DNR order (yellow form or pink MOST form) Yellow form placed in chart (order not valid for inpatient use) Yellow form placed in chart (order not valid for inpatient use) Yellow form placed in chart (order not valid for inpatient use) Yellow form placed in chart (order not valid for inpatient use) Yellow form placed in chart (order not valid for inpatient use) Yellow form placed in chart (order not valid for inpatient use) Yellow form placed in chart (order not valid for inpatient use)  ? ? ?Current Medications (verified) ?Outpatient Encounter Medications as of  01/12/2022  ?Medication Sig  ? acetaminophen (TYLENOL) 500 MG tablet Take 500 mg by mouth every 8 (eight) hours as needed.  ? acetaminophen (TYLENOL) 500 MG tablet Take 500 mg by mouth at bedtime.  ? Albuterol Sulfate (PROAIR RESPICLICK) 1500(90 Base) MCG/ACT AEPB Inhale into the lungs.  ? alendronate (FOSAMAX) 70 MG tablet Take 70 mg by mouth once a week. Every Sunday take with a full glass of water on an empty stomach.  ? atenolol (TENORMIN) 50 MG tablet Take 50 mg by mouth every evening.  ? BREO ELLIPTA 200-25 MCG/INH AEPB Inhale 1 puff into the lungs daily.  ? Calcium Carb-Cholecalciferol (CALCIUM 500 + D PO) Take 1 tablet by mouth daily.  ? chlorthalidone (HYGROTON) 25 MG tablet Take 25 mg by mouth daily.  ? cholecalciferol (VITAMIN D) 1000 UNITS tablet Take 2,000 Units by mouth every morning.  ? Cyanocobalamin 1000 MCG/ML KIT Inject as directed. Once A Day on the 10th of the Month  ? guaiFENesin-dextromethorphan (ROBITUSSIN DM) 100-10 MG/5ML syrup Take 15 mLs by mouth every 6 (six) hours as needed for cough (and congestion).  ? loratadine (CLARITIN) 10 MG tablet Take 10 mg by mouth daily.  ? melatonin 3 MG TABS tablet Take 3 mg by mouth at bedtime.  ? Multiple Vitamins-Minerals (PRESERVISION AREDS 2) CAPS Take 1 capsule by mouth in the morning  and at bedtime.  ? NON FORMULARY Diet:Regular  ? nystatin (MYCOSTATIN/NYSTOP) powder Apply 1 application topically daily as needed. Special Instructions: Apply thin layer of Nystatin powder to skin around stoma when changing colostomy bag PRN irritation.  ? pantoprazole (PROTONIX) 40 MG tablet Take 40 mg by mouth 2 (two) times daily.  ? phenol (CHLORASEPTIC WARM SORE THROAT) 1.4 % LIQD 1 spray every 12 (twelve) hours. mucous membrane ?Sore throat  ? potassium chloride SA (KLOR-CON) 20 MEQ tablet Take 40 mEq by mouth 2 (two) times daily.  ? ?No facility-administered encounter medications on file as of 01/12/2022.  ? ? ?Allergies (verified) ?Patient has no known allergies.   ? ?History: ?Past Medical History:  ?Diagnosis Date  ? Anxiety   ? Arthritis   ? Asthma   ? Cancer [facility])   ? breast  ? Depression   ? Diverticulosis   ? GERD (gastroesophageal reflux disease)   ? HTN (hypertension)   ? Shortness of breath   ? exertion  ? Sleep apnea   ? STOP BANG 4  ? Thyroid dysfunction   ? ?Past Surgical History:  ?Procedure Laterality Date  ? ABDOMINAL HYSTERECTOMY    ? arthroscopic surgery rt knee  1997  ? BREAST SURGERY  2001  ? LEFT/ CANCER   ? broken radius ulna  1990  ? left  ? broken right ankle  1976  ? EXPLORATORY LAPAROTOMY  08/24/2020  ? with partial sigmoidectomy creat of end colostomy and application of negative pressure dressing.     ? JOINT REPLACEMENT  right knee 2005 [place]  ? left knee  2004  ? rt foot bone spur removed  1992  ? THYROID SURGERY  1983  ? TOTAL KNEE ARTHROPLASTY  01/22/2012  ? Procedure: TOTAL KNEE ARTHROPLASTY;  Surgeon: Carole Civil, MD;  Location: [facility];  Service: Orthopedics;  Laterality: Left;  ? TUBAL LIGATION    ? ?Family History  ?Problem Relation Age of Onset  ? Pseudochol deficiency Neg Hx   ? Malignant hyperthermia Neg Hx   ? Hypotension Neg Hx   ? Anesthesia problems Neg Hx   ? ?Social History  ? ?Socioeconomic History  ? Marital status: Widowed  ?  Spouse name: Not on file  ? Number of children: Not on file  ? Years of education: Not on file  ? Highest education level: Not on file  ?Occupational History  ? Not on file  ?Tobacco Use  ? Smoking status: Former  ? Smokeless tobacco: Former  ?Vaping Use  ? Vaping Use: Never used  ?Substance and Sexual Activity  ? Alcohol use: No  ? Drug use: No  ? Sexual activity: Never  ?Other Topics Concern  ? Not on file  ?Social History Narrative  ? Not on file  ? ?Social Determinants of Health  ? ?Financial Resource Strain: Not on file  ?Food Insecurity: Not on file  ?Transportation Needs: Not on file  ?Physical Activity: Not on file  ?Stress: Not on file  ?Social Connections: Not on file  ? ? ?Tobacco  Counseling ?Counseling given: Not Answered ? ? ?Clinical Intake: ? ?Pre-visit preparation completed: Yes ? ?Pain : No/denies pain ? ?  ? ?BMI - recorded: 40.47 ?Nutritional Status: BMI > 30  Obese ?Nutritional Risks: Unintentional weight gain ?Diabetes: No ? ?How often do you need to have someone help you when you read instructions, pamphlets, or other written materials from your doctor or pharmacy?: 5 - Always ? ?Diabetic?no ? ?Interpreter Needed?: No ? ?  ? ? ?  Activities of Daily Living ? ?  01/16/2022  ? 11:06 AM  ?In your present state of health, do you have any difficulty performing the following activities:  ?Hearing? 0  ?Difficulty concentrating or making decisions? 1  ?Walking or climbing stairs? 1  ?Dressing or bathing? 1  ?Doing errands, shopping? 1  ?Preparing Food and eating ? Y  ?Using the Toilet? Y  ?In the past six months, have you accidently leaked urine? Y  ?Do you have problems with loss of bowel control? Y  ?Managing your Medications? Y  ?Managing your Finances? Y  ?Housekeeping or managing your Housekeeping? Y  ? ? ?Patient Care Team: ?Gerlene Fee, NP as PCP - General (Geriatric Medicine) ? ?Indicate any recent Medical Services you may have received from other than [facility] providers in the past year (date may be approximate). ? ?   ?Assessment:  ? This is a routine wellness examination for [facility]. ? ?Hearing/Vision screen ?No results found. ? ?Dietary issues and exercise activities discussed: ?Current Exercise Habits: The patient does not participate in regular exercise at present, Exercise limited by: None identified ? ? Goals Addressed   ? ?  ?  ?  ?  ? This Visit's Progress  ?  Absence of Fall and Fall-Related Injury   On track  ?  Evidence-based guidance:  ?Assess fall risk using a validated tool when available. Consider balance and gait impairment, muscle weakness, diminished vision or hearing, environmental hazards, presence of urinary or bowel urgency and/or incontinence.  ?Communicate  fall injury risk to interprofessional healthcare team.  ?Develop a fall prevention plan with the patient and family.  ?Promote use of personal vision and auditory aids.  ?Promote reorientation, appropriate sensory st

## 2022-01-16 NOTE — Patient Instructions (Signed)
?  [name] , ?Thank you for taking time to come for your Medicare Wellness Visit. I appreciate your ongoing commitment to your health goals. Please review the following plan we discussed and let me know if I can assist you in the future.  ? ?These are the goals we discussed: ? Goals   ? ?  Absence of Fall and Fall-Related Injury   ?  Evidence-based guidance:  ?Assess fall risk using a validated tool when available. Consider balance and gait impairment, muscle weakness, diminished vision or hearing, environmental hazards, presence of urinary or bowel urgency and/or incontinence.  ?Communicate fall injury risk to interprofessional healthcare team.  ?Develop a fall prevention plan with the patient and family.  ?Promote use of personal vision and auditory aids.  ?Promote reorientation, appropriate sensory stimulation, and routines to decrease risk of fall when changes in mental status are present.  ?Assess assistance level required for safe and effective self-care; consider referral for home care.  ?Encourage physical activity, such as performance of self-care at highest level of ability, strength and balance exercise program, and provision of appropriate assistive devices; refer to rehabilitation therapy.  ?Refer to community-based fall prevention program where available.  ?If fall occurs, determine the cause and revise fall injury prevention plan.  ?Regularly review medication contribution to fall risk; consider risk related to polypharmacy and age.  ?Refer to pharmacist for consultation when concerns about medications are revealed.  ?Balance adequate pain management with potential for oversedation.  ?Provide guidance related to environmental modifications.  ?Consider supplementation with Vitamin D.   ?Notes:  ?  ?  Follow up with Provider as scheduled   ?  General - Client will not be readmitted within 30 days ([facility])   ? ?  ?  ?This is a list of the screening recommended for you and due dates:  ?Health  Maintenance  ?Topic Date Due  ? Tetanus Vaccine  07/28/2031  ? Pneumonia Vaccine  Completed  ? DEXA scan (bone density measurement)  Completed  ? COVID-19 Vaccine  Completed  ? Zoster (Shingles) Vaccine  Completed  ? HPV Vaccine  Aged Out  ? Flu Shot  Discontinued  ? ? ?

## 2022-01-25 ENCOUNTER — Encounter: Payer: Self-pay | Admitting: Internal Medicine

## 2022-01-25 ENCOUNTER — Non-Acute Institutional Stay (SKILLED_NURSING_FACILITY): Payer: Medicare Other | Admitting: Internal Medicine

## 2022-01-25 DIAGNOSIS — N182 Chronic kidney disease, stage 2 (mild): Secondary | ICD-10-CM | POA: Diagnosis not present

## 2022-01-25 DIAGNOSIS — D539 Nutritional anemia, unspecified: Secondary | ICD-10-CM | POA: Diagnosis not present

## 2022-01-25 DIAGNOSIS — I10 Essential (primary) hypertension: Secondary | ICD-10-CM | POA: Diagnosis not present

## 2022-01-25 DIAGNOSIS — J4540 Moderate persistent asthma, uncomplicated: Secondary | ICD-10-CM

## 2022-01-25 NOTE — Patient Instructions (Signed)
See assessment and plan under each diagnosis in the problem list and acutely for this visit 

## 2022-01-25 NOTE — Assessment & Plan Note (Addendum)
01/25/2022 she describes a cough productive of clear to white phlegm over the previous 2-3 weeks.  She denies any associated constitutional, URI, extrinsic symptoms or wheezing.  She also denies any significant reflux symptoms.  She does have a history of having smoked approximately a decade less than a half a pack per day.  Today she denied a history of asthma. ?She is not on ACE inhibitor. ?A short course of low-dose steroids will be discussed with the NP.  CBC and differential and imaging if symptoms progress. ?

## 2022-01-25 NOTE — Assessment & Plan Note (Addendum)
CKD stage II is stable with creatinine of 0.66 and GFR greater than 60.No medication change indicated. ?

## 2022-01-25 NOTE — Assessment & Plan Note (Addendum)
Blood pressure is actually soft with recording of 103/53.  If this is the usual average; antihypertensives can be weaned further. ?

## 2022-01-25 NOTE — Assessment & Plan Note (Addendum)
Anemia is resolved but the macrocytosis persists. B12 level has been normal. ?

## 2022-01-25 NOTE — Progress Notes (Signed)
? ?NURSING HOME LOCATION:  [facility] ?ROOM NUMBER:  144 P ? ?CODE STATUS:  DNR ? ?UEA:[id number] Green NP,PSC ? ?This is a nursing facility follow up visit of chronic medical diagnoses & to document compliance with Regulation 483.30 (c) in [employer] [place] Phase [employer] which mandates caregiver visit ( visits can alternate among physician, PA or NP as per statutes) within 10 days of 30 days / 60 days/ 90 days post admission to SNF date   ? ?Interim medical record and care since last SNF visit was updated with review of diagnostic studies and change in clinical status since last visit were documented. ? ?HPI: She is a permanent resident of the facility with medical diagnoses of history of asthma, history of breast cancer, diverticulosis, GERD, essential hypertension, thyroid dysfunction, and sleep apnea. ? ?Most recent labs were completed 10/17/2021.  Creatinine was 0.66 with a GFR of greater than 60 indicating stage II CKD.  Although MCV was elevated at 104.5 no anemia was present. ? ?Review of systems: She states that she has had a cough for 2-3 weeks productive of clear to white phlegm.  She denies any upper respiratory tract symptoms, history of asthma, or reflux symptoms.  She states she smoked possibly 10 years , less than half a pack per day.  The cough is aggravating enough that it awakens her.  She denies paroxysmal nocturnal dyspnea. ? ?Constitutional: No fever, significant weight change, fatigue  ?Eyes: No redness, discharge, pain, vision change ?ENT/mouth: No nasal congestion,  purulent discharge, earache, change in hearing, sore throat  ?Cardiovascular: No chest pain, palpitations  ?Respiratory: No hemoptysis,  significant snoring, apnea   ?Gastrointestinal: No heartburn, dysphagia, abdominal pain, nausea /vomiting, rectal bleeding, melena, change in bowels ?Genitourinary: No dysuria, hematuria, pyuria, incontinence, nocturia ?Musculoskeletal: No joint stiffness, joint  swelling, weakness, pain ?Dermatologic: No rash, pruritus, change in appearance of skin ?Neurologic: No dizziness, headache, syncope, seizures, numbness, tingling ?Psychiatric: No significant anxiety, depression, insomnia, anorexia ?Endocrine: No change in hair/skin/nails, excessive thirst, excessive hunger, excessive urination  ?Hematologic/lymphatic: No significant bruising, lymphadenopathy, abnormal bleeding ?Allergy/immunology: No itchy/watery eyes, significant sneezing, urticaria, angioedema ? ?Physical exam:  ?Pertinent or positive findings: When I entered the room she was asleep in the chair.  She did not exhibit hypopnea or apnea.  Upon awakening she was noted to be profoundly hard of hearing.  Eyebrows are decreased laterally.  She has complete dentures.  Heart sounds are distant but the second heart sound is accentuated.  Chest was clear.  She did not cough during the exam.  Abdomen is protuberant.  Thighs are enlarged.  She has 1+ pitting edema of the ankles.  Pedal pulses are nonpalpable.  She appears to have a boss over the dorsum of each foot. ? ?General appearance: Adequately nourished; no acute distress, increased work of breathing is present.   ?Lymphatic: No lymphadenopathy about the head, neck, axilla. ?Eyes: No conjunctival inflammation or lid edema is present. There is no scleral icterus. ?Ears:  External ear exam shows no significant lesions or deformities.   ?Nose:  External nasal examination shows no deformity or inflammation. Nasal mucosa are pink and moist without lesions, exudates ?Oral exam:  There is no oropharyngeal erythema or exudate. ?Neck:  No thyromegaly, masses, tenderness noted.    ?Heart:  No gallop, murmur, click, rub .  ?Lungs:  without wheezes, rhonchi, rales, rubs. ?Abdomen: Bowel sounds are normal. Abdomen is soft and nontender with no organomegaly, hernias, masses. ?GU:  Deferred  ?Extremities:  No cyanosis, clubbing ?Skin: Warm & dry w/o tenting. ?No significant lesions or  rash. ? ?See summary under each active problem in the Problem List with associated updated therapeutic plan ? ? ?

## 2022-01-30 ENCOUNTER — Encounter: Payer: Self-pay | Admitting: Adult Health

## 2022-01-30 DIAGNOSIS — Z0000 Encounter for general adult medical examination without abnormal findings: Secondary | ICD-10-CM

## 2022-01-30 NOTE — Progress Notes (Signed)
?Location:  [facility] ?Nursing Home Room Number: 144-P ?Place of Service:  SNF (31) ? ? ?CODE STATUS: DNR ? ?No Known Allergies ? ?Chief Complaint  ?Patient presents with  ? Medical Management of Chronic Issues  ?  Hypertension   ? ? ?HPI: ? ? ? ?Past Medical History:  ?Diagnosis Date  ? Anxiety   ? Arthritis   ? Asthma   ? Cancer [facility])   ? breast  ? Depression   ? Diverticulosis   ? GERD (gastroesophageal reflux disease)   ? HTN (hypertension)   ? Shortness of breath   ? exertion  ? Sleep apnea   ? STOP BANG 4  ? Thyroid dysfunction   ? ? ?Past Surgical History:  ?Procedure Laterality Date  ? ABDOMINAL HYSTERECTOMY    ? arthroscopic surgery rt knee  1997  ? BREAST SURGERY  2001  ? LEFT/ CANCER   ? broken radius ulna  1990  ? left  ? broken right ankle  1976  ? EXPLORATORY LAPAROTOMY  08/24/2020  ? with partial sigmoidectomy creat of end colostomy and application of negative pressure dressing.     ? JOINT REPLACEMENT  right knee 2005 [place]  ? left knee  2004  ? rt foot bone spur removed  1992  ? THYROID SURGERY  1983  ? TOTAL KNEE ARTHROPLASTY  01/22/2012  ? Procedure: TOTAL KNEE ARTHROPLASTY;  Surgeon: Carole Civil, MD;  Location: [facility];  Service: Orthopedics;  Laterality: Left;  ? TUBAL LIGATION    ? ? ?Social History  ? ?Socioeconomic History  ? Marital status: Widowed  ?  Spouse name: Not on file  ? Number of children: Not on file  ? Years of education: Not on file  ? Highest education level: Not on file  ?Occupational History  ? Not on file  ?Tobacco Use  ? Smoking status: Former  ? Smokeless tobacco: Former  ?Vaping Use  ? Vaping Use: Never used  ?Substance and Sexual Activity  ? Alcohol use: No  ? Drug use: No  ? Sexual activity: Never  ?Other Topics Concern  ? Not on file  ?Social History Narrative  ? Not on file  ? ?Social Determinants of Health  ? ?Financial Resource Strain: Not on file  ?Food Insecurity: Not on file  ?Transportation Needs: Not on file  ?Physical Activity: Not on file   ?Stress: Not on file  ?Social Connections: Not on file  ?Intimate Partner Violence: Not on file  ? ?Family History  ?Problem Relation Age of Onset  ? Pseudochol deficiency Neg Hx   ? Malignant hyperthermia Neg Hx   ? Hypotension Neg Hx   ? Anesthesia problems Neg Hx   ? ? ? ? ?VITAL SIGNS ?BP 118/73   Pulse 71   Temp (!) 97.4 ?F (36.3 ?C)   Ht _0  (1.448 m)   Wt 186 lb 9.6 oz (84.6 kg)   SpO2 96%   BMI 40.38 kg/m?  ? ?Outpatient Encounter Medications as of 01/30/2022  ?Medication Sig  ? acetaminophen (TYLENOL) 500 MG tablet Take 500 mg by mouth every 8 (eight) hours as needed.  ? acetaminophen (TYLENOL) 500 MG tablet Take 500 mg by mouth at bedtime.  ? alendronate (FOSAMAX) 70 MG tablet Take 70 mg by mouth once a week. Every Sunday take with a full glass of water on an empty stomach.  ? atenolol (TENORMIN) 50 MG tablet Take 50 mg by mouth every evening.  ? BREO ELLIPTA 200-25  MCG/INH AEPB Inhale 1 puff into the lungs daily.  ? Calcium Carb-Cholecalciferol (CALCIUM 500 + D PO) Take 1 tablet by mouth daily.  ? chlorthalidone (HYGROTON) 25 MG tablet Take 25 mg by mouth daily.  ? cholecalciferol (VITAMIN D) 1000 UNITS tablet Take 2,000 Units by mouth every morning.  ? Cyanocobalamin 1000 MCG/ML KIT Inject as directed. Once A Day on the 10th of the Month  ? guaiFENesin-dextromethorphan (ROBITUSSIN DM) 100-10 MG/5ML syrup Take 15 mLs by mouth every 6 (six) hours as needed for cough (and congestion).  ? loratadine (CLARITIN) 10 MG tablet Take 10 mg by mouth daily.  ? melatonin 3 MG TABS tablet Take 3 mg by mouth at bedtime.  ? Multiple Vitamins-Minerals (PRESERVISION AREDS 2) CAPS Take 1 capsule by mouth in the morning and at bedtime.  ? NON FORMULARY Diet:Regular  ? nystatin (MYCOSTATIN/NYSTOP) powder Apply 1 application topically daily as needed. Special Instructions: Apply thin layer of Nystatin powder to skin around stoma when changing colostomy bag PRN irritation.  ? pantoprazole (PROTONIX) 40 MG tablet Take 40  mg by mouth 2 (two) times daily.  ? phenol (CHLORASEPTIC WARM SORE THROAT) 1.4 % LIQD 1 spray every 12 (twelve) hours. mucous membrane ?Sore throat  ? potassium chloride SA (KLOR-CON) 20 MEQ tablet Take 40 mEq by mouth 2 (two) times daily.  ? [DISCONTINUED] Albuterol Sulfate (PROAIR RESPICLICK) 499 (90 Base) MCG/ACT AEPB Inhale into the lungs.  ? ?No facility-administered encounter medications on file as of 01/30/2022.  ? ? ? ?SIGNIFICANT DIAGNOSTIC EXAMS ? ? ? ? ? ? ?ASSESSMENT/ PLAN: ? ? ? ? ?Ok Edwards NP ?[facility]  ?Contact [phone] Monday through Friday 8am- 5pm  ?After hours call [phone]  ? ?

## 2022-01-31 NOTE — Progress Notes (Signed)
This encounter was created in error - please disregard.

## 2022-03-01 ENCOUNTER — Non-Acute Institutional Stay (SKILLED_NURSING_FACILITY): Payer: Medicare Other | Admitting: Adult Health

## 2022-03-01 ENCOUNTER — Encounter: Payer: Self-pay | Admitting: Adult Health

## 2022-03-01 DIAGNOSIS — K56609 Unspecified intestinal obstruction, unspecified as to partial versus complete obstruction: Secondary | ICD-10-CM | POA: Diagnosis not present

## 2022-03-01 DIAGNOSIS — I700 Atherosclerosis of aorta: Secondary | ICD-10-CM | POA: Diagnosis not present

## 2022-03-01 DIAGNOSIS — M47816 Spondylosis without myelopathy or radiculopathy, lumbar region: Secondary | ICD-10-CM | POA: Diagnosis not present

## 2022-03-01 DIAGNOSIS — Z933 Colostomy status: Secondary | ICD-10-CM

## 2022-03-01 DIAGNOSIS — F339 Major depressive disorder, recurrent, unspecified: Secondary | ICD-10-CM

## 2022-03-01 DIAGNOSIS — I708 Atherosclerosis of other arteries: Secondary | ICD-10-CM

## 2022-03-01 NOTE — Progress Notes (Signed)
Location:  [facility] Room Number: 144-P Place of Service:  SNF (31)   CODE STATUS: DNR  No Known Allergies  Chief Complaint  Patient presents with   Medical Management of Chronic Issues                                             Major depression recurrent chronic:  Aortoiliac atherosclerosis  Large bowel obstruction:    Degenerative disc disease lumbar:     HPI:  She is a 88 year old long term resident of this facility being seen for the management of her chronic illnesses: Major depression recurrent chronic:  Aortoiliac atherosclerosis  Large bowel obstruction:    Degenerative disc disease lumbar: there are no reports of uncontrolled pain. No reports of anxiety or depressive thoughts.   Past Medical History:  Diagnosis Date   Anxiety    Arthritis    Asthma    Cancer ([place])    breast   Depression    Diverticulosis    GERD (gastroesophageal reflux disease)    HTN (hypertension)    Shortness of breath    exertion   Sleep apnea    STOP BANG 4   Thyroid dysfunction     Past Surgical History:  Procedure Laterality Date   ABDOMINAL HYSTERECTOMY     arthroscopic surgery rt knee  1997   BREAST SURGERY  2001   LEFT/ CANCER    broken radius ulna  1990   left   broken right ankle  1976   EXPLORATORY LAPAROTOMY  08/24/2020   with partial sigmoidectomy creat of end colostomy and application of negative pressure dressing.      JOINT REPLACEMENT  right knee 2005 [place]   left knee  2004   rt foot bone spur removed  [place]  01/22/2012   Procedure: TOTAL KNEE ARTHROPLASTY;  Surgeon: Carole Civil, MD;  Location: [facility];  Service: Orthopedics;  Laterality: Left;   TUBAL LIGATION      Social History   Socioeconomic History   Marital status: Widowed    Spouse name: Not on file   Number of children: Not on file   Years of education: Not on file   Highest education level: Not on file   Occupational History   Not on file  Tobacco Use   Smoking status: Former   Smokeless tobacco: Former  Scientific laboratory technician Use: Never used  Substance and Sexual Activity   Alcohol use: No   Drug use: No   Sexual activity: Never  Other Topics Concern   Not on file  Social History Narrative   Not on file   Social Determinants of Health   Financial Resource Strain: Not on file  Food Insecurity: Not on file  Transportation Needs: Not on file  Physical Activity: Not on file  Stress: Not on file  Social Connections: Not on file  Intimate Partner Violence: Not on file   Family History  Problem Relation Age of Onset   Pseudochol deficiency Neg Hx    Malignant hyperthermia Neg Hx    Hypotension Neg Hx    Anesthesia problems Neg Hx       VITAL SIGNS BP 110/64   Pulse 78   Temp 97.7 F (36.5 C)   Resp 18  Ht 4' 9" (1.448 m)   Wt 186 lb 6.4 oz (84.6 kg)   SpO2 96%   BMI 40.34 kg/m   Outpatient Encounter Medications as of 03/01/2022  Medication Sig   acetaminophen (TYLENOL) 500 MG tablet Take 500 mg by mouth every 8 (eight) hours as needed.   acetaminophen (TYLENOL) 500 MG tablet Take 500 mg by mouth at bedtime.   alendronate (FOSAMAX) 70 MG tablet Take 70 mg by mouth once a week. Every Sunday take with a full glass of water on an empty stomach.   atenolol (TENORMIN) 50 MG tablet Take 50 mg by mouth every evening.   BREO ELLIPTA 200-25 MCG/INH AEPB Inhale 1 puff into the lungs daily.   Calcium Carb-Cholecalciferol (CALCIUM 500 + D PO) Take 1 tablet by mouth daily.   chlorthalidone (HYGROTON) 25 MG tablet Take 25 mg by mouth daily.   Cholecalciferol (VITAMIN D3) 50 MCG (2000 UT) TABS Take 1 tablet by mouth daily.   Cyanocobalamin 1000 MCG/ML KIT Inject as directed. Once A Day on the 10th of the Month   guaiFENesin-dextromethorphan (ROBITUSSIN DM) 100-10 MG/5ML syrup Take 15 mLs by mouth every 6 (six) hours as needed for cough (and congestion).   loratadine (CLARITIN) 10  MG tablet Take 10 mg by mouth daily.   melatonin 3 MG TABS tablet Take 3 mg by mouth at bedtime.   Multiple Vitamins-Minerals (PRESERVISION AREDS 2) CAPS Take 1 capsule by mouth in the morning and at bedtime.   NON FORMULARY Diet:Regular   nystatin (MYCOSTATIN/NYSTOP) powder Apply 1 application topically daily as needed. Special Instructions: Apply thin layer of Nystatin powder to skin around stoma when changing colostomy bag PRN irritation.   pantoprazole (PROTONIX) 40 MG tablet Take 40 mg by mouth 2 (two) times daily.   phenol (CHLORASEPTIC WARM SORE THROAT) 1.4 % LIQD 1 spray every 12 (twelve) hours. mucous membrane Sore throat   potassium chloride SA (KLOR-CON) 20 MEQ tablet Take 40 mEq by mouth 2 (two) times daily.   [DISCONTINUED] cholecalciferol (VITAMIN D) 1000 UNITS tablet Take 2,000 Units by mouth every morning. (Patient not taking: Reported on 03/01/2022)   No facility-administered encounter medications on file as of 03/01/2022.     SIGNIFICANT DIAGNOSTIC EXAMS  PREVIOUS   03-02-21: DEXA: t score -3.258  NO NEW LABS.   LABS REVIEWED PREVIOUS  04-07-21: wbc 6.8; hgb 13.9; hct 42.2; mcv 104.5 plt 228; glucose 125; bun 25; creat 0.70; k+ 3.6; na++ 137; ca 9.1; GFR>60; d-dimer 0.55; CRP 2.3  06-02-21: vitamin B12: 447 folate 29.1 10-17-21: wbc 5.9; hgb 14.9; hct 44.3; mcv 104.5 plt 267; glucose 97; bun 14; creat 0.66; k+ 3.4; na++ 137; ca 9.1; GFR>60; protein 7.3; albumin 3.8   NO NEW LABS.   Review of Systems  Unable to perform ROS: Dementia    Physical Exam Constitutional:      General: She is not in acute distress.    Appearance: She is well-developed. She is not diaphoretic.  Neck:     Thyroid: No thyromegaly.  Cardiovascular:     Rate and Rhythm: Normal rate and regular rhythm.     Pulses: Normal pulses.     Heart sounds: Normal heart sounds.  Pulmonary:     Effort: Pulmonary effort is normal. No respiratory distress.     Breath sounds: Normal breath sounds.   Abdominal:     General: Bowel sounds are normal. There is no distension.     Palpations: Abdomen is soft.  Tenderness: There is no abdominal tenderness.     Comments: Colostomy   Musculoskeletal:        General: Normal range of motion.     Cervical back: Neck supple.     Right lower leg: No edema.     Left lower leg: No edema.     Comments: Kyphosis   Lymphadenopathy:     Cervical: No cervical adenopathy.  Skin:    General: Skin is warm and dry.  Neurological:     Mental Status: She is alert. Mental status is at baseline.  Psychiatric:        Mood and Affect: Mood normal.       ASSESSMENT/ PLAN:  TODAY  Major depression recurrent chronic: will continue cymbalta 40 gm daily and melatonin 3 mg nightly   2. Aortoiliac atherosclerosis (ct 08-21-20) will monitor  3. Large bowel obstruction: s/p colostomy  4. Degenerative disc disease lumbar: will continue cymbalta 40 mg daily   PREVIOUS   4. Moderate persistent asthma in adult: stable will continue breo ellipta 200/25 mcg 1 puff daily   5. Macrocytic anemia: hgb 14.9 will monitor  6. Post menopausal osteoporosis: t score -3.253 will continue fosamax 70 mg weekly   7. Vitamin B 12 deficiency: level is 446 will continue monthly injections.   8. Non-seasonal allergic rhinitis unspecified trigger; will continue claritin 10 mg daily   9. GERD without esophagitis: is stable will continue protonix 40 mg twice dail y  9. Essential hypertension: is stable. B/p 110/64  will continue tenormin 50 mg daily and hygroton 25 mg daily   11. Hypokalemia: k+ 3.4 will continue k+ 40 meq twice daily   Ok Edwards NP [facility]  [phone]

## 2022-03-10 ENCOUNTER — Non-Acute Institutional Stay (SKILLED_NURSING_FACILITY): Payer: Medicare Other | Admitting: Adult Health

## 2022-03-10 ENCOUNTER — Encounter: Payer: Self-pay | Admitting: Adult Health

## 2022-03-10 DIAGNOSIS — F339 Major depressive disorder, recurrent, unspecified: Secondary | ICD-10-CM

## 2022-03-10 DIAGNOSIS — Z933 Colostomy status: Secondary | ICD-10-CM | POA: Diagnosis not present

## 2022-03-10 DIAGNOSIS — I700 Atherosclerosis of aorta: Secondary | ICD-10-CM

## 2022-03-10 DIAGNOSIS — I708 Atherosclerosis of other arteries: Secondary | ICD-10-CM

## 2022-03-10 NOTE — Progress Notes (Signed)
Location:  [facility] Room Number: 144-P Place of Service:  SNF (31)   CODE STATUS: DNR  No Known Allergies  Chief Complaint  Patient presents with   Acute Visit    Care plan meeting    HPI:  We have come together for her care plan meeting. BIMS 15/15 mood 0/30. She is nonambulatory uses wheelchair; no falls. She requires limited to extensive assist with her adls. She is frequently incontinent of bladder; has colostomy. Dietary: weight is stable at 186.4 pounds; regular diet; good appetite; feeds self. Therapy: none at this time. She continues to be followed for her chronic illnesses including:  Aorto-iliac atherosclerosis   Colostomy status  Major depression recurrent chronic  Past Medical History:  Diagnosis Date   Anxiety    Arthritis    Asthma    Cancer ([place])    breast   Depression    Diverticulosis    GERD (gastroesophageal reflux disease)    HTN (hypertension)    Shortness of breath    exertion   Sleep apnea    STOP BANG 4   Thyroid dysfunction     Past Surgical History:  Procedure Laterality Date   ABDOMINAL HYSTERECTOMY     arthroscopic surgery rt knee  1997   BREAST SURGERY  2001   LEFT/ CANCER    broken radius ulna  1990   left   broken right ankle  1976   EXPLORATORY LAPAROTOMY  08/24/2020   with partial sigmoidectomy creat of end colostomy and application of negative pressure dressing.      JOINT REPLACEMENT  right knee 2005 [facility]   left knee  2004   rt foot bone spur removed  [place]  01/22/2012   Procedure: TOTAL KNEE ARTHROPLASTY;  Surgeon: Carole Civil, MD;  Location: [facility];  Service: Orthopedics;  Laterality: Left;   TUBAL LIGATION      Social History   Socioeconomic History   Marital status: Widowed    Spouse name: Not on file   Number of children: Not on file   Years of education: Not on file   Highest education level: Not on file  Occupational History    Not on file  Tobacco Use   Smoking status: Former   Smokeless tobacco: Former  Scientific laboratory technician Use: Never used  Substance and Sexual Activity   Alcohol use: No   Drug use: No   Sexual activity: Never  Other Topics Concern   Not on file  Social History Narrative   Not on file   Social Determinants of Health   Financial Resource Strain: Not on file  Food Insecurity: Not on file  Transportation Needs: Not on file  Physical Activity: Not on file  Stress: Not on file  Social Connections: Not on file  Intimate Partner Violence: Not on file   Family History  Problem Relation Age of Onset   Pseudochol deficiency Neg Hx    Malignant hyperthermia Neg Hx    Hypotension Neg Hx    Anesthesia problems Neg Hx       VITAL SIGNS BP 102/61   Pulse 71   Temp (!) 97.5 F (36.4 C)   Ht _0  (1.448 m)   Wt 186 lb 6.4 oz (84.6 kg)   SpO2 96%   BMI 40.34 kg/m   Outpatient Encounter Medications as of 03/10/2022  Medication Sig   acetaminophen (TYLENOL) 500  MG tablet Take 500 mg by mouth every 8 (eight) hours as needed.   acetaminophen (TYLENOL) 500 MG tablet Take 500 mg by mouth at bedtime.   alendronate (FOSAMAX) 70 MG tablet Take 70 mg by mouth once a week. Every Sunday take with a full glass of water on an empty stomach.   atenolol (TENORMIN) 50 MG tablet Take 50 mg by mouth every evening.   BREO ELLIPTA 200-25 MCG/INH AEPB Inhale 1 puff into the lungs daily.   Calcium Carb-Cholecalciferol (CALCIUM 500 + D PO) Take 1 tablet by mouth daily.   chlorthalidone (HYGROTON) 25 MG tablet Take 25 mg by mouth daily.   Cholecalciferol (VITAMIN D3) 50 MCG (2000 UT) TABS Take 1 tablet by mouth daily.   Cyanocobalamin 1000 MCG/ML KIT Inject as directed. Once A Day on the 10th of the Month   guaiFENesin-dextromethorphan (ROBITUSSIN DM) 100-10 MG/5ML syrup Take 15 mLs by mouth every 6 (six) hours as needed for cough (and congestion).   loratadine (CLARITIN) 10 MG tablet Take 10 mg by mouth  daily.   melatonin 3 MG TABS tablet Take 3 mg by mouth at bedtime.   Multiple Vitamins-Minerals (PRESERVISION AREDS 2) CAPS Take 1 capsule by mouth in the morning and at bedtime.   NON FORMULARY Diet:Regular   nystatin (MYCOSTATIN/NYSTOP) powder Apply 1 application topically daily as needed. Special Instructions: Apply thin layer of Nystatin powder to skin around stoma when changing colostomy bag PRN irritation.   pantoprazole (PROTONIX) 40 MG tablet Take 40 mg by mouth 2 (two) times daily.   phenol (CHLORASEPTIC WARM SORE THROAT) 1.4 % LIQD 1 spray every 12 (twelve) hours. mucous membrane Sore throat   potassium chloride SA (KLOR-CON) 20 MEQ tablet Take 40 mEq by mouth 2 (two) times daily.   No facility-administered encounter medications on file as of 03/10/2022.     SIGNIFICANT DIAGNOSTIC EXAMS  PREVIOUS   03-02-21: DEXA: t score -3.258  NO NEW LABS.   LABS REVIEWED PREVIOUS  04-07-21: wbc 6.8; hgb 13.9; hct 42.2; mcv 104.5 plt 228; glucose 125; bun 25; creat 0.70; k+ 3.6; na++ 137; ca 9.1; GFR>60; d-dimer 0.55; CRP 2.3  06-02-21: vitamin B12: 447 folate 29.1 10-17-21: wbc 5.9; hgb 14.9; hct 44.3; mcv 104.5 plt 267; glucose 97; bun 14; creat 0.66; k+ 3.4; na++ 137; ca 9.1; GFR>60; protein 7.3; albumin 3.8   NO NEW LABS.   Review of Systems  Constitutional:  Negative for malaise/fatigue.  Respiratory:  Negative for cough and shortness of breath.   Cardiovascular:  Negative for chest pain, palpitations and leg swelling.  Gastrointestinal:  Negative for abdominal pain, constipation and heartburn.  Musculoskeletal:  Negative for back pain, joint pain and myalgias.  Skin: Negative.   Neurological:  Negative for dizziness.  Psychiatric/Behavioral:  The patient is not nervous/anxious.      Physical Exam Constitutional:      General: She is not in acute distress.    Appearance: She is well-developed. She is not diaphoretic.  Neck:     Thyroid: No thyromegaly.  Cardiovascular:      Rate and Rhythm: Normal rate and regular rhythm.     Pulses: Normal pulses.     Heart sounds: Normal heart sounds.  Pulmonary:     Effort: Pulmonary effort is normal. No respiratory distress.     Breath sounds: Normal breath sounds.  Abdominal:     General: Bowel sounds are normal. There is no distension.     Palpations: Abdomen is soft.  Tenderness: There is no abdominal tenderness.     Comments: Colostomy   Musculoskeletal:        General: Normal range of motion.     Cervical back: Neck supple.     Right lower leg: No edema.     Left lower leg: No edema.     Comments: Kyphosis   Lymphadenopathy:     Cervical: No cervical adenopathy.  Skin:    General: Skin is warm and dry.  Neurological:     Mental Status: She is alert. Mental status is at baseline.  Psychiatric:        Mood and Affect: Mood normal.       ASSESSMENT/ PLAN:  TODAY  Aorto-iliac atherosclerosis  Colostomy status Major depression recurrent chronic   Will continue current medications Will continue current plan of care Will continue to monitor her status.   Time spent with patient: 40 minutes: plan of care activities; medications  Ok Edwards NP [facility]  call [phone]

## 2022-04-03 DIAGNOSIS — B351 Tinea unguium: Secondary | ICD-10-CM | POA: Diagnosis not present

## 2022-04-03 DIAGNOSIS — I739 Peripheral vascular disease, unspecified: Secondary | ICD-10-CM | POA: Diagnosis not present

## 2022-04-06 ENCOUNTER — Non-Acute Institutional Stay (SKILLED_NURSING_FACILITY): Payer: Medicare Other | Admitting: Adult Health

## 2022-04-06 ENCOUNTER — Encounter: Payer: Self-pay | Admitting: Adult Health

## 2022-04-06 DIAGNOSIS — Z933 Colostomy status: Secondary | ICD-10-CM

## 2022-04-06 DIAGNOSIS — E538 Deficiency of other specified B group vitamins: Secondary | ICD-10-CM

## 2022-04-06 DIAGNOSIS — J4540 Moderate persistent asthma, uncomplicated: Secondary | ICD-10-CM | POA: Diagnosis not present

## 2022-04-06 DIAGNOSIS — D539 Nutritional anemia, unspecified: Secondary | ICD-10-CM | POA: Diagnosis not present

## 2022-04-06 DIAGNOSIS — M810 Age-related osteoporosis without current pathological fracture: Secondary | ICD-10-CM

## 2022-04-06 NOTE — Progress Notes (Signed)
Location:  [place] Room Number: 144-P Place of Service:  SNF (31)   CODE STATUS: DNR  No Known Allergies  Chief Complaint  Patient presents with  . Medical Management of Chronic Issues                               ***    HPI:    Past Medical History:  Diagnosis Date  . Anxiety   . Arthritis   . Asthma   . Cancer ([place])    breast  . Depression   . Diverticulosis   . GERD (gastroesophageal reflux disease)   . HTN (hypertension)   . Shortness of breath    exertion  . Sleep apnea    STOP BANG 4  . Thyroid dysfunction     Past Surgical History:  Procedure Laterality Date  . ABDOMINAL HYSTERECTOMY    . arthroscopic surgery rt knee  1997  . BREAST SURGERY  2001   LEFT/ CANCER   . broken radius ulna  1990   left  . broken right ankle  1976  . EXPLORATORY LAPAROTOMY  08/24/2020   with partial sigmoidectomy creat of end colostomy and application of negative pressure dressing.     Marland Kitchen JOINT REPLACEMENT  right knee 2005 [place]  . left knee  2004  . rt foot bone spur removed  1992  . THYROID SURGERY  1983  . TOTAL KNEE ARTHROPLASTY  01/22/2012   Procedure: TOTAL KNEE ARTHROPLASTY;  Surgeon: Carole Civil, MD;  Location: [facility];  Service: Orthopedics;  Laterality: Left;  . TUBAL LIGATION      Social History   Socioeconomic History  . Marital status: Widowed    Spouse name: Not on file  . Number of children: Not on file  . Years of education: Not on file  . Highest education level: Not on file  Occupational History  . Not on file  Tobacco Use  . Smoking status: Former  . Smokeless tobacco: Former  Media planner  . Vaping Use: Never used  Substance and Sexual Activity  . Alcohol use: No  . Drug use: No  . Sexual activity: Never  Other Topics Concern  . Not on file  Social History Narrative  . Not on file   Social Determinants of Health   Financial Resource Strain: Not on file  Food Insecurity: Not on file   Transportation Needs: Not on file  Physical Activity: Not on file  Stress: Not on file  Social Connections: Not on file  Intimate Partner Violence: Not on file   Family History  Problem Relation Age of Onset  . Pseudochol deficiency Neg Hx   . Malignant hyperthermia Neg Hx   . Hypotension Neg Hx   . Anesthesia problems Neg Hx       VITAL SIGNS BP (!) 122/57   Pulse 69   Temp (!) 97.5 F (36.4 C)   Resp 20   Ht '4\' 9"'  (1.448 m)   Wt 187 lb 6.4 oz (85 kg)   SpO2 95%   BMI 40.55 kg/m   Outpatient Encounter Medications as of 04/06/2022  Medication Sig  . acetaminophen (TYLENOL) 500 MG tablet Take 500 mg by mouth every 8 (eight) hours as needed.  Marland Kitchen acetaminophen (TYLENOL) 500 MG tablet Take 500 mg by mouth at bedtime.  Marland Kitchen alendronate (FOSAMAX) 70 MG tablet Take 70 mg by mouth once a week. Every  Sunday take with a full glass of water on an empty stomach.  Marland Kitchen atenolol (TENORMIN) 50 MG tablet Take 50 mg by mouth every evening.  Marland Kitchen BREO ELLIPTA 200-25 MCG/INH AEPB Inhale 1 puff into the lungs daily.  . Calcium Carb-Cholecalciferol (CALCIUM 500 + D PO) Take 1 tablet by mouth daily.  . chlorthalidone (HYGROTON) 25 MG tablet Take 25 mg by mouth daily.  . Cholecalciferol (VITAMIN D3) 50 MCG (2000 UT) TABS Take 1 tablet by mouth daily.  . Cyanocobalamin 1000 MCG/ML KIT Inject as directed. Once A Day on the 10th of the Month  . loratadine (CLARITIN) 10 MG tablet Take 10 mg by mouth daily.  . melatonin 3 MG TABS tablet Take 3 mg by mouth at bedtime.  . Multiple Vitamins-Minerals (PRESERVISION AREDS 2) CAPS Take 1 capsule by mouth in the morning and at bedtime.  . NON FORMULARY Diet:Regular  . nystatin (MYCOSTATIN/NYSTOP) powder Apply 1 application topically daily as needed. Special Instructions: Apply thin layer of Nystatin powder to skin around stoma when changing colostomy bag PRN irritation.  . pantoprazole (PROTONIX) 40 MG tablet Take 40 mg by mouth 2 (two) times daily.  . potassium  chloride SA (KLOR-CON) 20 MEQ tablet Take 40 mEq by mouth 2 (two) times daily.  . [DISCONTINUED] guaiFENesin-dextromethorphan (ROBITUSSIN DM) 100-10 MG/5ML syrup Take 15 mLs by mouth every 6 (six) hours as needed for cough (and congestion).  . [DISCONTINUED] phenol (CHLORASEPTIC WARM SORE THROAT) 1.4 % LIQD 1 spray every 12 (twelve) hours. mucous membrane Sore throat   No facility-administered encounter medications on file as of 04/06/2022.     SIGNIFICANT DIAGNOSTIC EXAMS   PREVIOUS   03-02-21: DEXA: t score -3.258  NO NEW LABS.   LABS REVIEWED PREVIOUS  04-07-21: wbc 6.8; hgb 13.9; hct 42.2; mcv 104.5 plt 228; glucose 125; bun 25; creat 0.70; k+ 3.6; na++ 137; ca 9.1; GFR>60; d-dimer 0.55; CRP 2.3  06-02-21: vitamin B12: 447 folate 29.1 10-17-21: wbc 5.9; hgb 14.9; hct 44.3; mcv 104.5 plt 267; glucose 97; bun 14; creat 0.66; k+ 3.4; na++ 137; ca 9.1; GFR>60; protein 7.3; albumin 3.8   NO NEW LABS.   Review of Systems  Constitutional:  Negative for malaise/fatigue.  Respiratory:  Negative for cough and shortness of breath.   Cardiovascular:  Negative for chest pain, palpitations and leg swelling.  Gastrointestinal:  Negative for abdominal pain, constipation and heartburn.  Musculoskeletal:  Negative for back pain, joint pain and myalgias.  Skin: Negative.   Neurological:  Negative for dizziness.  Psychiatric/Behavioral:  The patient is not nervous/anxious.     Physical Exam Constitutional:      General: She is not in acute distress.    Appearance: She is well-developed. She is not diaphoretic.  Neck:     Thyroid: No thyromegaly.  Cardiovascular:     Rate and Rhythm: Normal rate and regular rhythm.     Heart sounds: Normal heart sounds.  Pulmonary:     Effort: Pulmonary effort is normal. No respiratory distress.     Breath sounds: Normal breath sounds.  Abdominal:     General: Bowel sounds are normal. There is no distension.     Palpations: Abdomen is soft.      Tenderness: There is no abdominal tenderness.     Comments: Colostomy   Musculoskeletal:        General: Normal range of motion.     Cervical back: Neck supple.     Right lower leg: No edema.  Left lower leg: No edema.     Comments: Kyphosis   Lymphadenopathy:     Cervical: No cervical adenopathy.  Skin:    General: Skin is warm and dry.  Neurological:     Mental Status: She is alert. Mental status is at baseline.  Psychiatric:        Mood and Affect: Mood normal.       ASSESSMENT/ PLAN:  TODAY  Moderate persistent asthma in adult: stable will continue breo ellipta 200/25 mcg 1 puff daily   2. Macrocytic anemia: hgb 14.9 will monitor   3. Post menopausal osteoporosis t score -3.253 will continue fosamax 70 mg weekly   4. Vitamin B 12 deficiency: level 446 will continue monthly injections.   PREVIOUS   5. Non-seasonal allergic rhinitis unspecified trigger; will continue claritin 10 mg daily   6.  GERD without esophagitis: is stable will continue protonix 40 mg twice dail y  7. Essential hypertension: is stable. B/p 110/64  will continue tenormin 50 mg daily and hygroton 25 mg daily   8. Hypokalemia: k+ 3.4 will continue k+ 40 meq twice daily   9. Major depression recurrent chronic: will continue cymbalta 40 mg daily and melatonin 3 mg nightly   10. Aortoiliac atherosclerosis (ct 08-21-20) will monitor  11. Large bowel obstruction: s/p colostomy  12. Degenerative disc disease lumbar: will continue cymbalta 40 mg daily      Ok Edwards NP [facility]  call [phone]

## 2022-04-10 ENCOUNTER — Other Ambulatory Visit (HOSPITAL_COMMUNITY)
Admission: RE | Admit: 2022-04-10 | Discharge: 2022-04-10 | Disposition: A | Discharge status: Home / Self Care | Payer: Medicare Other | Source: Skilled Nursing Facility | Attending: Adult Health | Admitting: Adult Health

## 2022-04-10 DIAGNOSIS — N182 Chronic kidney disease, stage 2 (mild): Secondary | ICD-10-CM | POA: Insufficient documentation

## 2022-04-10 DIAGNOSIS — E559 Vitamin D deficiency, unspecified: Secondary | ICD-10-CM | POA: Diagnosis not present

## 2022-04-10 LAB — COMPREHENSIVE METABOLIC PANEL
ALT: 9 U/L (ref 0–44)
AST: 15 U/L (ref 15–41)
Albumin: 3.2 g/dL — ABNORMAL LOW (ref 3.5–5.0)
Alkaline Phosphatase: 51 U/L (ref 38–126)
Anion gap: 8 (ref 5–15)
BUN: 17 mg/dL (ref 8–23)
CO2: 27 mmol/L (ref 22–32)
Calcium: 9 mg/dL (ref 8.9–10.3)
Chloride: 104 mmol/L (ref 98–111)
Creatinine, Ser: 0.67 mg/dL (ref 0.44–1.00)
GFR, Estimated: >60 mL/min (ref >60)
Glucose, Bld: 86 mg/dL (ref 70–99)
Potassium: 3.3 mmol/L — ABNORMAL LOW (ref 3.5–5.1)
Sodium: 139 mmol/L (ref 135–145)
Total Bilirubin: 0.5 mg/dL (ref 0.3–1.2)
Total Protein: 6.1 g/dL — ABNORMAL LOW (ref 6.5–8.1)

## 2022-04-10 LAB — CBC
HCT: 37.9 % (ref 36.0–46.0)
Hemoglobin: 12.5 g/dL (ref 12.0–15.0)
MCH: 33.5 pg (ref 26.0–34.0)
MCHC: 33 g/dL (ref 30.0–36.0)
MCV: 101.6 fL — ABNORMAL HIGH (ref 80.0–100.0)
Platelets: 196 10*3/uL (ref 150–400)
RBC: 3.73 MIL/uL — ABNORMAL LOW (ref 3.87–5.11)
RDW: 12 % (ref 11.5–15.5)
WBC: 6.1 10*3/uL (ref 4.0–10.5)
nRBC: 0 % (ref 0.0–0.2)

## 2022-04-10 LAB — VITAMIN D 25 HYDROXY (VIT D DEFICIENCY, FRACTURES): Vit D, 25-Hydroxy: 45.08 ng/mL (ref 30–100)

## 2022-05-04 ENCOUNTER — Non-Acute Institutional Stay (SKILLED_NURSING_FACILITY): Payer: Medicare Other | Admitting: Internal Medicine

## 2022-05-04 ENCOUNTER — Encounter: Payer: Self-pay | Admitting: Internal Medicine

## 2022-05-04 DIAGNOSIS — E440 Moderate protein-calorie malnutrition: Secondary | ICD-10-CM | POA: Diagnosis not present

## 2022-05-04 DIAGNOSIS — E876 Hypokalemia: Secondary | ICD-10-CM

## 2022-05-04 DIAGNOSIS — E46 Unspecified protein-calorie malnutrition: Secondary | ICD-10-CM | POA: Insufficient documentation

## 2022-05-04 DIAGNOSIS — I10 Essential (primary) hypertension: Secondary | ICD-10-CM | POA: Diagnosis not present

## 2022-05-04 DIAGNOSIS — E079 Disorder of thyroid, unspecified: Secondary | ICD-10-CM

## 2022-05-04 DIAGNOSIS — D539 Nutritional anemia, unspecified: Secondary | ICD-10-CM

## 2022-05-04 NOTE — Patient Instructions (Signed)
See assessment and plan under each diagnosis in the problem list and acutely for this visit 

## 2022-05-04 NOTE — Assessment & Plan Note (Signed)
With persistent hypokalemia despite 20 mEq of potassium twice daily; changing chlorthalidone to spironolactone will be discussed with NP.

## 2022-05-04 NOTE — Progress Notes (Signed)
NURSING HOME LOCATION:  [facility] ROOM NUMBER:  144 P  CODE STATUS:  DNR  PCP:  Ok Edwards NP  This is a nursing facility follow up visit of chronic medical diagnoses & to document compliance with Regulation 483.30 (c) in The [place] Manual Phase 2 which mandates caregiver visit ( visits can alternate among physician, PA or NP as per statutes) within 10 days of 30 days / 60 days/ 90 days post admission to SNF date    Interim medical record and care since last SNF visit was updated with review of diagnostic studies and change in clinical status since last visit were documented.  HPI: She is a permanent resident of this facility with medical diagnoses of history of asthma, history of breast cancer, history of diverticulosis, GERD, essential hypertension, "thyroid dysfunction", and sleep apnea. Significant procedures and surgeries include abdominal hysterectomy, left breast surgery, thyroid surgery, and partial sigmoidectomy with creation of colostomy.  Labs are current and reveal mild hypokalemia with a value of 3.3.  Epic med list states that she is on potassium 20 mEq twice daily.  She is on chlorthalidone 25 mg daily.  Protein/caloric malnutrition suggested despite her morbid obesity as total protein is 6.1 and albumin 3.2. Creatinine is 0.67 with GFR greater than 60.  Macrocytosis is present with an MCV of 101.6, down from 104.5.  There is no associated anemia.Last B12 level 447 on 06/02/21. There is no TSH on record despite the history of thyroid surgery.  Review of systems: She denies any active symptoms other than chronic edema, stating "I think I am doing good."  She could not tell me whether she had had thyroid surgery as is listed in the history.  Constitutional: No fever, significant weight change, fatigue  Eyes: No redness, discharge, pain, vision change ENT/mouth: No nasal congestion,  purulent discharge, earache, change in hearing, sore throat   Cardiovascular: No chest pain, palpitations, paroxysmal nocturnal dyspnea Respiratory: No cough, sputum production, hemoptysis, DOE, significant snoring, apnea   Gastrointestinal: No heartburn, dysphagia, abdominal pain, nausea /vomiting, rectal bleeding, melena, change in bowels Genitourinary: No dysuria, hematuria, pyuria, incontinence, nocturia Musculoskeletal: No joint stiffness, joint swelling, weakness, pain Dermatologic: No rash, pruritus, change in appearance of skin Neurologic: No dizziness, headache, syncope, seizures, numbness, tingling Psychiatric: No significant anxiety, depression, insomnia, anorexia Endocrine: No change in hair/skin/nails, excessive thirst, excessive hunger, excessive urination  Hematologic/lymphatic: No significant bruising, lymphadenopathy, abnormal bleeding Allergy/immunology: No itchy/watery eyes, significant sneezing, urticaria, angioedema  Physical exam:  Pertinent or positive findings: She is severely hard of hearing.  She has complete dentures.  She has minor rales at the bases.  Central obesity is present ;a colostomy is present.  She has 1+ peripheral edema.  Pedal pulses are nonpalpable.  Deep tendon reflexes are 0-1/2+ in lower extremities and 1+ in the upper extremities.   . General appearance: Adequately nourished; no acute distress, increased work of breathing is present.   Lymphatic: No lymphadenopathy about the head, neck, axilla. Eyes: No conjunctival inflammation or lid edema is present. There is no scleral icterus. Ears:  External ear exam shows no significant lesions or deformities.   Nose:  External nasal examination shows no deformity or inflammation. Nasal mucosa are pink and moist without lesions, exudates Oral exam:  Lips and gums are healthy appearing. There is no oropharyngeal erythema or exudate. Neck:  No thyromegaly, masses, tenderness noted.    Heart:  Normal rate and regular rhythm. S1 and S2  normal without gallop, murmur,  click, rub .  Lungs:  without wheezes, rhonchi,  rubs. Abdomen: Bowel sounds are normal. Abdomen is soft and nontender with no organomegaly, hernias, masses. GU: Deferred  Extremities:  No cyanosis, clubbing  Neurologic exam :Balance, Rhomberg, finger to nose testing could not be completed due to clinical state Skin: Warm & dry w/o tenting. No significant lesions or rash.  See summary under each active problem in the Problem List with associated updated therapeutic plan

## 2022-05-04 NOTE — Assessment & Plan Note (Addendum)
Current potassium is 3.3.  Epic states she is on potassium 20 mEq twice a day.  Epic still lists chlorthalidone 25 mg daily.  Discuss change to spironolactone with NP.

## 2022-05-04 NOTE — Assessment & Plan Note (Signed)
Despite BMI of 40.55; total protein 6.1 albumin 3.2 suggest significant protein/caloric malnutrition.  Nutrition consult at SNF.

## 2022-05-04 NOTE — Assessment & Plan Note (Signed)
06/02/21 B12 447 ; update next month

## 2022-05-05 DIAGNOSIS — E079 Disorder of thyroid, unspecified: Secondary | ICD-10-CM | POA: Insufficient documentation

## 2022-05-05 NOTE — Assessment & Plan Note (Signed)
Update TFTs ° °

## 2022-05-08 ENCOUNTER — Encounter: Payer: Self-pay | Admitting: Adult Health

## 2022-05-08 ENCOUNTER — Non-Acute Institutional Stay (SKILLED_NURSING_FACILITY): Payer: Medicare Other | Admitting: Adult Health

## 2022-05-08 DIAGNOSIS — N182 Chronic kidney disease, stage 2 (mild): Secondary | ICD-10-CM | POA: Diagnosis not present

## 2022-05-08 DIAGNOSIS — I129 Hypertensive chronic kidney disease with stage 1 through stage 4 chronic kidney disease, or unspecified chronic kidney disease: Secondary | ICD-10-CM | POA: Insufficient documentation

## 2022-05-08 NOTE — Progress Notes (Signed)
Location:  [place] Room Number: NO/144/P Place of Service:  SNF (31) Ok Edwards S.,NP  CODE STATUS: DNR  No Known Allergies  Chief Complaint  Patient presents with   Acute Visit    Patient is here to discuss some health concerns    HPI:  She is presently taking hctz with k+ supplement for the management of her hypertension. Her readings have range of 100-126/57-74. There are no reports of chest pain; no vision changes; no dizziness.   Past Medical History:  Diagnosis Date   Anxiety    Arthritis    Asthma    Cancer ([place])    breast   Depression    Diverticulosis    GERD (gastroesophageal reflux disease)    HTN (hypertension)    Shortness of breath    exertion   Sleep apnea    STOP BANG 4   Thyroid dysfunction     Past Surgical History:  Procedure Laterality Date   ABDOMINAL HYSTERECTOMY     arthroscopic surgery rt knee  1997   BREAST SURGERY  2001   LEFT/ CANCER    broken radius ulna  1990   left   broken right ankle  1976   EXPLORATORY LAPAROTOMY  08/24/2020   with partial sigmoidectomy creat of end colostomy and application of negative pressure dressing.      JOINT REPLACEMENT  right knee 2005 [place]   left knee  2004   rt foot bone spur removed  [place]  01/22/2012   Procedure: TOTAL KNEE ARTHROPLASTY;  Surgeon: Carole Civil, MD;  Location: [facility];  Service: Orthopedics;  Laterality: Left;   TUBAL LIGATION      Social History   Socioeconomic History   Marital status: Widowed    Spouse name: Not on file   Number of children: Not on file   Years of education: Not on file   Highest education level: Not on file  Occupational History   Not on file  Tobacco Use   Smoking status: Former   Smokeless tobacco: Former  Scientific laboratory technician Use: Never used  Substance and Sexual Activity   Alcohol use: No   Drug use: No   Sexual activity: Never  Other Topics Concern    Not on file  Social History Narrative   Not on file   Social Determinants of Health   Financial Resource Strain: Not on file  Food Insecurity: Not on file  Transportation Needs: Not on file  Physical Activity: Not on file  Stress: Not on file  Social Connections: Not on file  Intimate Partner Violence: Not on file   Family History  Problem Relation Age of Onset   Pseudochol deficiency Neg Hx    Malignant hyperthermia Neg Hx    Hypotension Neg Hx    Anesthesia problems Neg Hx       VITAL SIGNS BP (!) 125/58   Pulse 71   Temp 97.6 F (36.4 C)   Resp 20   Ht '4\' 9"'  (1.448 m)   Wt 187 lb 6.4 oz (85 kg)   SpO2 98%   BMI 40.55 kg/m   Outpatient Encounter Medications as of 05/08/2022  Medication Sig   acetaminophen (TYLENOL) 500 MG tablet Take 500 mg by mouth every 8 (eight) hours as needed.   acetaminophen (TYLENOL) 500 MG tablet Take 500 mg by mouth at bedtime.   alendronate (FOSAMAX) 70 MG tablet  Take 70 mg by mouth once a week. Every Sunday take with a full glass of water on an empty stomach.   atenolol (TENORMIN) 50 MG tablet Take 50 mg by mouth every evening.   BREO ELLIPTA 200-25 MCG/INH AEPB Inhale 1 puff into the lungs daily.   Calcium Carb-Cholecalciferol (CALCIUM 500 + D PO) Take 1 tablet by mouth daily.   chlorthalidone (HYGROTON) 25 MG tablet Take 25 mg by mouth daily.   Cholecalciferol (VITAMIN D3) 50 MCG (2000 UT) TABS Take 1 tablet by mouth daily.   Cyanocobalamin 1000 MCG/ML KIT Inject as directed. Once A Day on the 10th of the Month   loratadine (CLARITIN) 10 MG tablet Take 10 mg by mouth daily.   melatonin 3 MG TABS tablet Take 3 mg by mouth at bedtime.   Multiple Vitamins-Minerals (PRESERVISION AREDS 2) CAPS Take 1 capsule by mouth in the morning and at bedtime.   NON FORMULARY Diet:Regular   nystatin (MYCOSTATIN/NYSTOP) powder Apply 1 application topically daily as needed. Special Instructions: Apply thin layer of Nystatin powder to skin around stoma  when changing colostomy bag PRN irritation.   pantoprazole (PROTONIX) 40 MG tablet Take 40 mg by mouth 2 (two) times daily.   potassium chloride SA (KLOR-CON) 20 MEQ tablet Take 40 mEq by mouth 2 (two) times daily.   No facility-administered encounter medications on file as of 05/08/2022.     SIGNIFICANT DIAGNOSTIC EXAMS  PREVIOUS   03-02-21: DEXA: t score -3.258  NO NEW LABS.   LABS REVIEWED PREVIOUS  04-07-21: wbc 6.8; hgb 13.9; hct 42.2; mcv 104.5 plt 228; glucose 125; bun 25; creat 0.70; k+ 3.6; na++ 137; ca 9.1; GFR>60; d-dimer 0.55; CRP 2.3  06-02-21: vitamin B12: 447 folate 29.1 10-17-21: wbc 5.9; hgb 14.9; hct 44.3; mcv 104.5 plt 267; glucose 97; bun 14; creat 0.66; k+ 3.4; na++ 137; ca 9.1; GFR>60; protein 7.3; albumin 3.8   TODAY  04-10-22: wbc 6.1; hgb 12.5; hct 37.9; mcv 101.6 plt 196; glucose 86; bun 17; creat 0.67; k+ 3.3; na++ 139; ca 9.0; gfr >60 protein 6.1; albumin 3.2; vit D 45.08  Review of Systems  Constitutional:  Negative for malaise/fatigue.  Respiratory:  Negative for cough and shortness of breath.   Cardiovascular:  Negative for chest pain, palpitations and leg swelling.  Gastrointestinal:  Negative for abdominal pain, constipation and heartburn.  Musculoskeletal:  Negative for back pain, joint pain and myalgias.  Skin: Negative.   Neurological:  Negative for dizziness.  Psychiatric/Behavioral:  The patient is not nervous/anxious.     Physical Exam Constitutional:      General: She is not in acute distress.    Appearance: She is well-developed. She is not diaphoretic.  Neck:     Thyroid: No thyromegaly.  Cardiovascular:     Rate and Rhythm: Normal rate and regular rhythm.     Heart sounds: Normal heart sounds.  Pulmonary:     Effort: Pulmonary effort is normal. No respiratory distress.     Breath sounds: Normal breath sounds.  Abdominal:     General: Bowel sounds are normal. There is no distension.     Palpations: Abdomen is soft.     Tenderness:  There is no abdominal tenderness.     Comments: Colostomy   Musculoskeletal:        General: Normal range of motion.     Cervical back: Neck supple.     Right lower leg: No edema.     Left lower leg: No edema.  Comments: Kyphosis   Lymphadenopathy:     Cervical: No cervical adenopathy.  Skin:    General: Skin is warm and dry.  Neurological:     Mental Status: She is alert. Mental status is at baseline.  Psychiatric:        Mood and Affect: Mood normal.       ASSESSMENT/ PLAN:  TODAY  Benign hypertension with chronic kidney disease stage II:   Will stop hctz  Will change k+ tp 20 meq daily  Will repeat bmp on 05-08-22.     Ok Edwards NP [facility]   call [phone]

## 2022-05-15 ENCOUNTER — Encounter (HOSPITAL_COMMUNITY)
Admission: RE | Admit: 2022-05-15 | Discharge: 2022-05-15 | Disposition: A | Discharge status: Home / Self Care | Payer: Medicare Other | Source: Skilled Nursing Facility | Attending: Adult Health | Admitting: Adult Health

## 2022-05-15 ENCOUNTER — Encounter: Payer: Self-pay | Admitting: Adult Health

## 2022-05-15 ENCOUNTER — Non-Acute Institutional Stay (SKILLED_NURSING_FACILITY): Payer: Medicare Other | Admitting: Adult Health

## 2022-05-15 DIAGNOSIS — N182 Chronic kidney disease, stage 2 (mild): Secondary | ICD-10-CM

## 2022-05-15 DIAGNOSIS — E876 Hypokalemia: Secondary | ICD-10-CM | POA: Diagnosis not present

## 2022-05-15 LAB — BASIC METABOLIC PANEL
Anion gap: 8 (ref 5–15)
BUN: 14 mg/dL (ref 8–23)
CO2: 27 mmol/L (ref 22–32)
Calcium: 8.8 mg/dL — ABNORMAL LOW (ref 8.9–10.3)
Chloride: 105 mmol/L (ref 98–111)
Creatinine, Ser: 0.63 mg/dL (ref 0.44–1.00)
GFR, Estimated: >60 mL/min (ref >60)
Glucose, Bld: 87 mg/dL (ref 70–99)
Potassium: 3 mmol/L — ABNORMAL LOW (ref 3.5–5.1)
Sodium: 140 mmol/L (ref 135–145)

## 2022-05-15 NOTE — Progress Notes (Signed)
Location:  [facility] [facility] Room Number: 144 Place of Service:  SNF (31)   CODE STATUS: dnr   No Known Allergies  Chief Complaint  Patient presents with   Acute Visit    Follow up labs     HPI:  Her k+ level has been low since 04-10-22. She has been supplemented. Her hctz has been stopped as well. There are no reports of changes in appetite. She continues to get out of bed daily.   Past Medical History:  Diagnosis Date   Anxiety    Arthritis    Asthma    Cancer ([place])    breast   Depression    Diverticulosis    GERD (gastroesophageal reflux disease)    HTN (hypertension)    Shortness of breath    exertion   Sleep apnea    STOP BANG 4   Thyroid dysfunction     Past Surgical History:  Procedure Laterality Date   ABDOMINAL HYSTERECTOMY     arthroscopic surgery rt knee  1997   BREAST SURGERY  2001   LEFT/ CANCER    broken radius ulna  1990   left   broken right ankle  1976   EXPLORATORY LAPAROTOMY  08/24/2020   with partial sigmoidectomy creat of end colostomy and application of negative pressure dressing.      JOINT REPLACEMENT  right knee 2005 [place]   left knee  2004   rt foot bone spur removed  [place]  01/22/2012   Procedure: TOTAL KNEE ARTHROPLASTY;  Surgeon: Carole Civil, MD;  Location: [facility];  Service: Orthopedics;  Laterality: Left;   TUBAL LIGATION      Social History   Socioeconomic History   Marital status: Widowed    Spouse name: Not on file   Number of children: Not on file   Years of education: Not on file   Highest education level: Not on file  Occupational History   Not on file  Tobacco Use   Smoking status: Former   Smokeless tobacco: Former  Scientific laboratory technician Use: Never used  Substance and Sexual Activity   Alcohol use: No   Drug use: No   Sexual activity: Never  Other Topics Concern   Not on file  Social History Narrative   Not on file   Social  Determinants of Health   Financial Resource Strain: Not on file  Food Insecurity: Not on file  Transportation Needs: Not on file  Physical Activity: Not on file  Stress: Not on file  Social Connections: Not on file  Intimate Partner Violence: Not on file   Family History  Problem Relation Age of Onset   Pseudochol deficiency Neg Hx    Malignant hyperthermia Neg Hx    Hypotension Neg Hx    Anesthesia problems Neg Hx       VITAL SIGNS BP 110/65   Pulse 70   Temp 97.6 F (36.4 C)   Resp 20   Ht '4\' 9"'  (1.448 m)   Wt 187 lb 6.4 oz (85 kg)   SpO2 100%   BMI 40.55 kg/m   Outpatient Encounter Medications as of 05/15/2022  Medication Sig   acetaminophen (TYLENOL) 500 MG tablet Take 500 mg by mouth every 8 (eight) hours as needed.   acetaminophen (TYLENOL) 500 MG tablet Take 500 mg by mouth at bedtime.   alendronate (FOSAMAX) 70 MG tablet Take  70 mg by mouth once a week. Every Sunday take with a full glass of water on an empty stomach.   atenolol (TENORMIN) 50 MG tablet Take 50 mg by mouth every evening.   BREO ELLIPTA 200-25 MCG/INH AEPB Inhale 1 puff into the lungs daily.   Calcium Carb-Cholecalciferol (CALCIUM 500 + D PO) Take 1 tablet by mouth daily.   Cholecalciferol (VITAMIN D3) 50 MCG (2000 UT) TABS Take 1 tablet by mouth daily.   Cyanocobalamin 1000 MCG/ML KIT Inject as directed. Once A Day on the 10th of the Month   loratadine (CLARITIN) 10 MG tablet Take 10 mg by mouth daily.   melatonin 3 MG TABS tablet Take 3 mg by mouth at bedtime.   Multiple Vitamins-Minerals (PRESERVISION AREDS 2) CAPS Take 1 capsule by mouth in the morning and at bedtime.   NON FORMULARY Diet:Regular   nystatin (MYCOSTATIN/NYSTOP) powder Apply 1 application topically daily as needed. Special Instructions: Apply thin layer of Nystatin powder to skin around stoma when changing colostomy bag PRN irritation.   pantoprazole (PROTONIX) 40 MG tablet Take 40 mg by mouth 2 (two) times daily.   potassium  chloride SA (KLOR-CON) 20 MEQ tablet Take 20 mEq by mouth daily.   [DISCONTINUED] chlorthalidone (HYGROTON) 25 MG tablet Take 25 mg by mouth daily.   No facility-administered encounter medications on file as of 05/15/2022.     SIGNIFICANT DIAGNOSTIC EXAMS  PREVIOUS   03-02-21: DEXA: t score -3.258  NO NEW LABS.   LABS REVIEWED PREVIOUS  06-02-21: vitamin B12: 447 folate 29.1 10-17-21: wbc 5.9; hgb 14.9; hct 44.3; mcv 104.5 plt 267; glucose 97; bun 14; creat 0.66; k+ 3.4; na++ 137; ca 9.1; GFR>60; protein 7.3; albumin 3.8   TODAY  04-10-22: wbc 6.1; hgb 12.5; hct 37.9; mcv 101.6 plt 196; glucose 86; bun 17; creat 0.67; k+ 3.3; na++ 139; ca 9.0; gfr >60 protein 6.1; albumin 3.2; vit D 45.08 05-15-22: glucose 87; bun 14; creat 0.63; k+ 3.0; na++ 140; ca 8.8; gfr >60   Review of Systems  Constitutional:  Negative for malaise/fatigue.  Respiratory:  Negative for cough and shortness of breath.   Cardiovascular:  Negative for chest pain, palpitations and leg swelling.  Gastrointestinal:  Negative for abdominal pain, constipation and heartburn.  Musculoskeletal:  Negative for back pain, joint pain and myalgias.  Skin: Negative.   Neurological:  Negative for dizziness.  Psychiatric/Behavioral:  The patient is not nervous/anxious.    Physical Exam Constitutional:      General: She is not in acute distress.    Appearance: She is well-developed. She is obese. She is not diaphoretic.  Neck:     Thyroid: No thyromegaly.  Cardiovascular:     Rate and Rhythm: Normal rate and regular rhythm.     Pulses: Normal pulses.     Heart sounds: Normal heart sounds.  Pulmonary:     Effort: Pulmonary effort is normal. No respiratory distress.     Breath sounds: Normal breath sounds.  Abdominal:     General: Bowel sounds are normal. There is no distension.     Palpations: Abdomen is soft.     Tenderness: There is no abdominal tenderness.     Comments: Colostomy   Musculoskeletal:        General:  Normal range of motion.     Cervical back: Neck supple.     Right lower leg: No edema.     Left lower leg: No edema.     Comments: Kyphosis  Lymphadenopathy:     Cervical: No cervical adenopathy.  Skin:    General: Skin is warm and dry.  Neurological:     Mental Status: She is alert. Mental status is at baseline.  Psychiatric:        Mood and Affect: Mood normal.       ASSESSMENT/ PLAN:  TODAY  CKD stage II Hypokalemia  For k+ 3.0 will increase k+ 40 meq daily; will repeat k+ level on 05-22-22.    Ok Edwards NP [facility]  call [phone]

## 2022-05-22 ENCOUNTER — Other Ambulatory Visit (HOSPITAL_COMMUNITY)
Admission: RE | Admit: 2022-05-22 | Discharge: 2022-05-22 | Disposition: A | Discharge status: Home / Self Care | Payer: Medicare Other | Source: Skilled Nursing Facility | Attending: Adult Health | Admitting: Adult Health

## 2022-05-22 DIAGNOSIS — E876 Hypokalemia: Secondary | ICD-10-CM | POA: Diagnosis not present

## 2022-05-22 LAB — POTASSIUM: Potassium: 3.6 mmol/L (ref 3.5–5.1)

## 2022-05-26 ENCOUNTER — Encounter: Payer: Self-pay | Admitting: Adult Health

## 2022-05-26 ENCOUNTER — Non-Acute Institutional Stay (SKILLED_NURSING_FACILITY): Payer: Medicare Other | Admitting: Adult Health

## 2022-05-26 DIAGNOSIS — I700 Atherosclerosis of aorta: Secondary | ICD-10-CM | POA: Diagnosis not present

## 2022-05-26 DIAGNOSIS — F339 Major depressive disorder, recurrent, unspecified: Secondary | ICD-10-CM

## 2022-05-26 DIAGNOSIS — Z933 Colostomy status: Secondary | ICD-10-CM

## 2022-05-26 DIAGNOSIS — E440 Moderate protein-calorie malnutrition: Secondary | ICD-10-CM | POA: Diagnosis not present

## 2022-05-26 DIAGNOSIS — I708 Atherosclerosis of other arteries: Secondary | ICD-10-CM

## 2022-05-26 NOTE — Progress Notes (Signed)
Location:  [place] Room Number: 144-P Place of Service:  SNF (31)   CODE STATUS: DNR  No Known Allergies  Chief Complaint  Patient presents with   Acute Visit    Care plan meeting    HPI:  We have come together for her care plan meeting.  SIMS 15/15 mood 0/30. Uses wheelchair independently with no falls. She requires supervision to extensive assist with her adls. She is frequently incontinent of bladder has colostomy. Dietary:  regular diet; feeds self; appetite 50-100% of meals; weight is 188.8 pounds. Therapy: none at this time . She will continue to be followed for her chronic illnesses including:  Aortic-iliac atherosclerosis  Colostomy status Major depression recurrent chronic Protein calorie malnutrition; moderate    Past Medical History:  Diagnosis Date   Anxiety    Arthritis    Asthma    Cancer ([place])    breast   Depression    Diverticulosis    GERD (gastroesophageal reflux disease)    HTN (hypertension)    Shortness of breath    exertion   Sleep apnea    STOP BANG 4   Thyroid dysfunction     Past Surgical History:  Procedure Laterality Date   ABDOMINAL HYSTERECTOMY     arthroscopic surgery rt knee  1997   BREAST SURGERY  2001   LEFT/ CANCER    broken radius ulna  1990   left   broken right ankle  1976   EXPLORATORY LAPAROTOMY  08/24/2020   with partial sigmoidectomy creat of end colostomy and application of negative pressure dressing.      JOINT REPLACEMENT  right knee 2005 [facility]   left knee  2004   rt foot bone spur removed  [place]  01/22/2012   Procedure: TOTAL KNEE ARTHROPLASTY;  Surgeon: Carole Civil, MD;  Location: [facility];  Service: Orthopedics;  Laterality: Left;   TUBAL LIGATION      Social History   Socioeconomic History   Marital status: Widowed    Spouse name: Not on file   Number of children: Not on file   Years of education: Not on file   Highest  education level: Not on file  Occupational History   Not on file  Tobacco Use   Smoking status: Former   Smokeless tobacco: Former  Scientific laboratory technician Use: Never used  Substance and Sexual Activity   Alcohol use: No   Drug use: No   Sexual activity: Never  Other Topics Concern   Not on file  Social History Narrative   Not on file   Social Determinants of Health   Financial Resource Strain: Not on file  Food Insecurity: Not on file  Transportation Needs: Not on file  Physical Activity: Not on file  Stress: Not on file  Social Connections: Not on file  Intimate Partner Violence: Not on file   Family History  Problem Relation Age of Onset   Pseudochol deficiency Neg Hx    Malignant hyperthermia Neg Hx    Hypotension Neg Hx    Anesthesia problems Neg Hx       VITAL SIGNS BP 125/61   Pulse 64   Temp 97.8 F (36.6 C)   Resp 18   Ht '4\' 9"'  (1.448 m)   Wt 188 lb 12.8 oz (85.6 kg)   SpO2 94%   BMI 40.86 kg/m   Outpatient Encounter Medications as  of 05/26/2022  Medication Sig   acetaminophen (TYLENOL) 500 MG tablet Take 500 mg by mouth every 8 (eight) hours as needed.   acetaminophen (TYLENOL) 500 MG tablet Take 500 mg by mouth at bedtime.   alendronate (FOSAMAX) 70 MG tablet Take 70 mg by mouth once a week. Every Sunday take with a full glass of water on an empty stomach.   atenolol (TENORMIN) 50 MG tablet Take 50 mg by mouth every evening.   BREO ELLIPTA 200-25 MCG/INH AEPB Inhale 1 puff into the lungs daily.   Calcium Carb-Cholecalciferol (CALCIUM 500 + D PO) Take 1 tablet by mouth daily.   Cholecalciferol (VITAMIN D3) 50 MCG (2000 UT) TABS Take 1 tablet by mouth daily.   Cyanocobalamin 1000 MCG/ML KIT Inject as directed. Once A Day on the 10th of the Month   loratadine (CLARITIN) 10 MG tablet Take 10 mg by mouth daily.   melatonin 3 MG TABS tablet Take 3 mg by mouth at bedtime.   Multiple Vitamins-Minerals (PRESERVISION AREDS 2) CAPS Take 1 capsule by mouth in the  morning and at bedtime.   NON FORMULARY Diet:Regular   nystatin (MYCOSTATIN/NYSTOP) powder Apply 1 application topically daily as needed. Special Instructions: Apply thin layer of Nystatin powder to skin around stoma when changing colostomy bag PRN irritation.   pantoprazole (PROTONIX) 40 MG tablet Take 40 mg by mouth 2 (two) times daily.   potassium chloride SA (KLOR-CON) 20 MEQ tablet Take 20 mEq by mouth daily.   No facility-administered encounter medications on file as of 05/26/2022.     SIGNIFICANT DIAGNOSTIC EXAMS   PREVIOUS   03-02-21: DEXA: t score -3.258  NO NEW LABS.   LABS REVIEWED PREVIOUS  06-02-21: vitamin B12: 447 folate 29.1 10-17-21: wbc 5.9; hgb 14.9; hct 44.3; mcv 104.5 plt 267; glucose 97; bun 14; creat 0.66; k+ 3.4; na++ 137; ca 9.1; GFR>60; protein 7.3; albumin 3.8  04-10-22: wbc 6.1; hgb 12.5; hct 37.9; mcv 101.6 plt 196; glucose 86; bun 17; creat 0.67; k+ 3.3; na++ 139; ca 9.0; gfr >60 protein 6.1; albumin 3.2; vit D 45.08 05-15-22: glucose 87; bun 14; creat 0.63; k+ 3.0; na++ 140; ca 8.8; gfr >60   NO NEW LABS.   Review of Systems  Constitutional:  Negative for malaise/fatigue.  Respiratory:  Negative for cough and shortness of breath.   Cardiovascular:  Negative for chest pain, palpitations and leg swelling.  Gastrointestinal:  Negative for abdominal pain, constipation and heartburn.  Musculoskeletal:  Negative for back pain, joint pain and myalgias.  Skin: Negative.   Neurological:  Negative for dizziness.  Psychiatric/Behavioral:  The patient is not nervous/anxious.    Physical Exam Constitutional:      General: She is not in acute distress.    Appearance: She is well-developed. She is obese. She is not diaphoretic.  Neck:     Thyroid: No thyromegaly.  Cardiovascular:     Rate and Rhythm: Normal rate and regular rhythm.     Heart sounds: Normal heart sounds.  Pulmonary:     Effort: Pulmonary effort is normal. No respiratory distress.     Breath  sounds: Normal breath sounds.  Abdominal:     General: Bowel sounds are normal. There is no distension.     Palpations: Abdomen is soft.     Tenderness: There is no abdominal tenderness.     Comments: colostomy  Musculoskeletal:        General: Normal range of motion.     Cervical back: Neck  supple.     Right lower leg: No edema.     Left lower leg: No edema.     Comments: Kyphosis   Lymphadenopathy:     Cervical: No cervical adenopathy.  Skin:    General: Skin is warm and dry.  Neurological:     Mental Status: She is alert. Mental status is at baseline.  Psychiatric:        Mood and Affect: Mood normal.       ASSESSMENT/ PLAN:  TODAY  Aortic-iliac atherosclerosis  Colostomy status Major depression recurrent chronic Protein calorie malnutrition; moderate   Will continue current medications Will continue current plan of care Will continue to monitor her status  Time spent with patient: 40 minutes: plan of care medications dietary.   Ok Edwards NP [facility] Adult Medicine  call [phone]

## 2022-06-01 ENCOUNTER — Non-Acute Institutional Stay (SKILLED_NURSING_FACILITY): Payer: Medicare Other | Admitting: Adult Health

## 2022-06-01 ENCOUNTER — Encounter: Payer: Self-pay | Admitting: Adult Health

## 2022-06-01 DIAGNOSIS — J3089 Other allergic rhinitis: Secondary | ICD-10-CM

## 2022-06-01 DIAGNOSIS — I10 Essential (primary) hypertension: Secondary | ICD-10-CM | POA: Diagnosis not present

## 2022-06-01 DIAGNOSIS — R600 Localized edema: Secondary | ICD-10-CM

## 2022-06-01 DIAGNOSIS — E876 Hypokalemia: Secondary | ICD-10-CM

## 2022-06-01 NOTE — Progress Notes (Signed)
Location:  [place] Room Number: 144 Place of Service:  SNF (31)   CODE STATUS: dnr   No Known Allergies  Chief Complaint  Patient presents with   Medical Management of Chronic Issues              Bilateral lower extremity edema: Non-seasonal allergic rhinitis unspecified trigger:  Essential hypertension: Hypokalemia:    HPI:  She is a 88 year old long term resident of this facility being seen for the management of her chronic illnesses:  Bilateral lower extremity edema: Non-seasonal allergic rhinitis unspecified trigger:  Essential hypertension: Hypokalemia. She has worsening edema. Her k+ level is stable at this time. Her weight is stable as well as her appetite.   Past Medical History:  Diagnosis Date   Anxiety    Arthritis    Asthma    Cancer ([place])    breast   Depression    Diverticulosis    GERD (gastroesophageal reflux disease)    HTN (hypertension)    Shortness of breath    exertion   Sleep apnea    STOP BANG 4   Thyroid dysfunction     Past Surgical History:  Procedure Laterality Date   ABDOMINAL HYSTERECTOMY     arthroscopic surgery rt knee  1997   BREAST SURGERY  2001   LEFT/ CANCER    broken radius ulna  1990   left   broken right ankle  1976   EXPLORATORY LAPAROTOMY  08/24/2020   with partial sigmoidectomy creat of end colostomy and application of negative pressure dressing.      JOINT REPLACEMENT  right knee 2005 [facility]   left knee  2004   rt foot bone spur removed  [place]  01/22/2012   Procedure: TOTAL KNEE ARTHROPLASTY;  Surgeon: Carole Civil, MD;  Location: [facility];  Service: Orthopedics;  Laterality: Left;   TUBAL LIGATION      Social History   Socioeconomic History   Marital status: Widowed    Spouse name: Not on file   Number of children: Not on file   Years of education: Not on file   Highest education level: Not on file  Occupational History   Not  on file  Tobacco Use   Smoking status: Former   Smokeless tobacco: Former  Scientific laboratory technician Use: Never used  Substance and Sexual Activity   Alcohol use: No   Drug use: No   Sexual activity: Never  Other Topics Concern   Not on file  Social History Narrative   Not on file   Social Determinants of Health   Financial Resource Strain: Not on file  Food Insecurity: Not on file  Transportation Needs: Not on file  Physical Activity: Not on file  Stress: Not on file  Social Connections: Not on file  Intimate Partner Violence: Not on file   Family History  Problem Relation Age of Onset   Pseudochol deficiency Neg Hx    Malignant hyperthermia Neg Hx    Hypotension Neg Hx    Anesthesia problems Neg Hx       VITAL SIGNS BP (!) 131/58   Pulse 70   Temp (!) 97.5 F (36.4 C)   Resp 18   Ht _0  (1.448 m)   Wt 192 lb 3.2 oz (87.2 kg)   SpO2 93%   BMI 41.59 kg/m   Outpatient Encounter Medications as  of 06/01/2022  Medication Sig   acetaminophen (TYLENOL) 500 MG tablet Take 500 mg by mouth every 8 (eight) hours as needed.   acetaminophen (TYLENOL) 500 MG tablet Take 500 mg by mouth at bedtime.   alendronate (FOSAMAX) 70 MG tablet Take 70 mg by mouth once a week. Every Sunday take with a full glass of water on an empty stomach.   atenolol (TENORMIN) 50 MG tablet Take 50 mg by mouth every evening.   BREO ELLIPTA 200-25 MCG/INH AEPB Inhale 1 puff into the lungs daily.   Calcium Carb-Cholecalciferol (CALCIUM 500 + D PO) Take 1 tablet by mouth daily.   Cholecalciferol (VITAMIN D3) 50 MCG (2000 UT) TABS Take 1 tablet by mouth daily.   Cyanocobalamin 1000 MCG/ML KIT Inject as directed. Once A Day on the 10th of the Month   loratadine (CLARITIN) 10 MG tablet Take 10 mg by mouth daily.   melatonin 3 MG TABS tablet Take 3 mg by mouth at bedtime.   Multiple Vitamins-Minerals (PRESERVISION AREDS 2) CAPS Take 1 capsule by mouth in the morning and at bedtime.   NON FORMULARY  Diet:Regular   nystatin (MYCOSTATIN/NYSTOP) powder Apply 1 application topically daily as needed. Special Instructions: Apply thin layer of Nystatin powder to skin around stoma when changing colostomy bag PRN irritation.   pantoprazole (PROTONIX) 40 MG tablet Take 40 mg by mouth 2 (two) times daily.   potassium chloride SA (KLOR-CON) 20 MEQ tablet Take 20 mEq by mouth daily.   No facility-administered encounter medications on file as of 06/01/2022.     SIGNIFICANT DIAGNOSTIC EXAMS   PREVIOUS   03-02-21: DEXA: t score -3.258  NO NEW LABS.   LABS REVIEWED PREVIOUS   06-02-21: vitamin B12: 447 folate 29.1 10-17-21: wbc 5.9; hgb 14.9; hct 44.3; mcv 104.5 plt 267; glucose 97; bun 14; creat 0.66; k+ 3.4; na++ 137; ca 9.1; GFR>60; protein 7.3; albumin 3.8   TODAY  04-10-22: wbc 6.1; hgb 12.5; hct 37.9; mcv 101.6 plt 196; glucose 86; bun 17; creat 0.67; k+ 3.3; na++ 139; ca 9.0; na++ 9.0; gfr >60; protein 6.1; albumin 3.2 vit D 45.08 05-15-22: glucose 87; bun 14; creat 0.63; k+ 3.0; na++ 140; ca 8.8; gf >60 05-22-22: k+ 3.6    Review of Systems  Constitutional:  Negative for malaise/fatigue.  Respiratory:  Negative for cough and shortness of breath.   Cardiovascular:  Positive for leg swelling. Negative for chest pain and palpitations.  Gastrointestinal:  Negative for abdominal pain, constipation and heartburn.  Musculoskeletal:  Negative for back pain, joint pain and myalgias.  Skin: Negative.   Neurological:  Negative for dizziness.  Psychiatric/Behavioral:  The patient is not nervous/anxious.    Physical Exam Constitutional:      General: She is not in acute distress.    Appearance: She is well-developed. She is obese. She is not diaphoretic.  Neck:     Thyroid: No thyromegaly.  Cardiovascular:     Rate and Rhythm: Normal rate and regular rhythm.     Pulses: Normal pulses.     Heart sounds: Normal heart sounds.  Pulmonary:     Effort: Pulmonary effort is normal. No respiratory  distress.     Breath sounds: Normal breath sounds.  Abdominal:     General: Bowel sounds are normal. There is no distension.     Palpations: Abdomen is soft.     Tenderness: There is no abdominal tenderness.     Comments: colostomy  Musculoskeletal:  General: Normal range of motion.     Cervical back: Neck supple.     Right lower leg: Edema present.     Left lower leg: Edema present.     Comments: Significant bilateral lower extremity edema   Lymphadenopathy:     Cervical: No cervical adenopathy.  Skin:    General: Skin is warm and dry.  Neurological:     Mental Status: She is alert. Mental status is at baseline.  Psychiatric:        Mood and Affect: Mood normal.        ASSESSMENT/ PLAN:  TODAY  Bilateral lower extremity edema: will begin lasix 20 mg daily with k+ 40 meq twice daily will check bmp on 06-08-22  2. Non-seasonal allergic rhinitis unspecified trigger: will continue claritin 10 mg daily   3. Essential hypertension: b/p 131/58 will continue tenormin 50 mg daily is off hctz.   4. Hypokalemia:  k+ 3.6 will increase k+ to 40 meq twice daily will check bmp on 06-08-22.    PREVIOUS   5. Major depression recurrent chronic: will continue cymbalta 40 mg daily and melatonin 3 mg nightly   6. Aortoiliac atherosclerosis (ct 08-21-20) will monitor  7. Large bowel obstruction: s/p colostomy  8. Degenerative disc disease lumbar: will continue cymbalta 40 mg daily   9. Moderate persistent asthma in adult: stable will continue breo ellipta 200/25 mcg 1 puff daily   10. Macrocytic anemia: hgb 12.5 will monitor   11. Post menopausal osteoporosis t score -3.253 will continue fosamax 70 mg weekly   12. Vitamin B 12 deficiency: level 446 will continue monthly injections.       Ok Edwards NP [facility]  call [phone]

## 2022-06-08 ENCOUNTER — Other Ambulatory Visit (HOSPITAL_COMMUNITY)
Admission: RE | Admit: 2022-06-08 | Discharge: 2022-06-08 | Disposition: A | Discharge status: Home / Self Care | Payer: Medicare Other | Source: Skilled Nursing Facility | Attending: Adult Health | Admitting: Adult Health

## 2022-06-08 DIAGNOSIS — N182 Chronic kidney disease, stage 2 (mild): Secondary | ICD-10-CM | POA: Diagnosis not present

## 2022-06-08 LAB — BASIC METABOLIC PANEL
Anion gap: 10 (ref 5–15)
BUN: 16 mg/dL (ref 8–23)
CO2: 25 mmol/L (ref 22–32)
Calcium: 8.9 mg/dL (ref 8.9–10.3)
Chloride: 106 mmol/L (ref 98–111)
Creatinine, Ser: 0.78 mg/dL (ref 0.44–1.00)
GFR, Estimated: >60 mL/min (ref >60)
Glucose, Bld: 71 mg/dL (ref 70–99)
Potassium: 3.9 mmol/L (ref 3.5–5.1)
Sodium: 141 mmol/L (ref 135–145)

## 2022-06-22 DIAGNOSIS — Z433 Encounter for attention to colostomy: Secondary | ICD-10-CM | POA: Diagnosis not present

## 2022-06-22 DIAGNOSIS — Z1383 Encounter for screening for respiratory disorder NEC: Secondary | ICD-10-CM | POA: Diagnosis not present

## 2022-06-22 DIAGNOSIS — Z1159 Encounter for screening for other viral diseases: Secondary | ICD-10-CM | POA: Diagnosis not present

## 2022-06-24 NOTE — Progress Notes (Signed)
No chief complaint on file.

## 2022-06-27 DIAGNOSIS — Z23 Encounter for immunization: Secondary | ICD-10-CM | POA: Diagnosis not present

## 2022-07-05 DIAGNOSIS — Z433 Encounter for attention to colostomy: Secondary | ICD-10-CM | POA: Diagnosis not present

## 2022-07-05 DIAGNOSIS — Z1383 Encounter for screening for respiratory disorder NEC: Secondary | ICD-10-CM | POA: Diagnosis not present

## 2022-07-05 DIAGNOSIS — Z1159 Encounter for screening for other viral diseases: Secondary | ICD-10-CM | POA: Diagnosis not present

## 2022-07-06 ENCOUNTER — Encounter: Payer: Self-pay | Admitting: Adult Health

## 2022-07-06 ENCOUNTER — Non-Acute Institutional Stay (SKILLED_NURSING_FACILITY): Payer: Medicare Other | Admitting: Adult Health

## 2022-07-06 DIAGNOSIS — I700 Atherosclerosis of aorta: Secondary | ICD-10-CM | POA: Diagnosis not present

## 2022-07-06 DIAGNOSIS — F339 Major depressive disorder, recurrent, unspecified: Secondary | ICD-10-CM

## 2022-07-06 DIAGNOSIS — M47816 Spondylosis without myelopathy or radiculopathy, lumbar region: Secondary | ICD-10-CM | POA: Diagnosis not present

## 2022-07-06 DIAGNOSIS — Z933 Colostomy status: Secondary | ICD-10-CM

## 2022-07-06 DIAGNOSIS — I708 Atherosclerosis of other arteries: Secondary | ICD-10-CM

## 2022-07-06 NOTE — Progress Notes (Signed)
Location:  [place] Room Number: 144-P Place of Service:  SNF (31)   CODE STATUS: DNR  No Known Allergies  Chief Complaint  Patient presents with   Medical Management of Chronic Issues                                        Major depression recurrent chronic:   Aortoiliac atherosclerosis History of large bowel obstruction  Degenerative disc disease lumbar:    HPI:  She is a 88 year old long term resident of this facility being seen for the management of her chronic illnesses:  Major depression recurrent chronic:   Aortoiliac atherosclerosis History of large bowel obstruction  Degenerative disc disease lumbar. There are no reports of uncontrolled pain. Her weight is stable no reports of changes in appetite. No reports of depressive or anxious thoughts.   Past Medical History:  Diagnosis Date   Anxiety    Arthritis    Asthma    Cancer ([place])    breast   Depression    Diverticulosis    GERD (gastroesophageal reflux disease)    HTN (hypertension)    Shortness of breath    exertion   Sleep apnea    STOP BANG 4   Thyroid dysfunction     Past Surgical History:  Procedure Laterality Date   ABDOMINAL HYSTERECTOMY     arthroscopic surgery rt knee  1997   BREAST SURGERY  2001   LEFT/ CANCER    broken radius ulna  1990   left   broken right ankle  1976   EXPLORATORY LAPAROTOMY  08/24/2020   with partial sigmoidectomy creat of end colostomy and application of negative pressure dressing.      JOINT REPLACEMENT  right knee 2005 [facility]   left knee  2004   rt foot bone spur removed  [place]  01/22/2012   Procedure: TOTAL KNEE ARTHROPLASTY;  Surgeon: Carole Civil, MD;  Location: [facility];  Service: Orthopedics;  Laterality: Left;   TUBAL LIGATION      Social History   Socioeconomic History   Marital status: Widowed    Spouse name: Not on file   Number of children: Not on file   Years of  education: Not on file   Highest education level: Not on file  Occupational History   Not on file  Tobacco Use   Smoking status: Former   Smokeless tobacco: Former  Scientific laboratory technician Use: Never used  Substance and Sexual Activity   Alcohol use: No   Drug use: No   Sexual activity: Never  Other Topics Concern   Not on file  Social History Narrative   Not on file   Social Determinants of Health   Financial Resource Strain: Not on file  Food Insecurity: Not on file  Transportation Needs: Not on file  Physical Activity: Not on file  Stress: Not on file  Social Connections: Not on file  Intimate Partner Violence: Not on file   Family History  Problem Relation Age of Onset   Pseudochol deficiency Neg Hx    Malignant hyperthermia Neg Hx    Hypotension Neg Hx    Anesthesia problems Neg Hx       VITAL SIGNS BP 126/75   Temp 98.7 F (37.1 C)   Resp 20  Ht _0  (1.448 m)   Wt 192 lb (87.1 kg)   SpO2 95%   BMI 41.55 kg/m   Outpatient Encounter Medications as of 07/06/2022  Medication Sig   acetaminophen (TYLENOL) 500 MG tablet Take 500 mg by mouth every 8 (eight) hours as needed.   acetaminophen (TYLENOL) 500 MG tablet Take 500 mg by mouth at bedtime.   alendronate (FOSAMAX) 70 MG tablet Take 70 mg by mouth once a week. Every Sunday take with a full glass of water on an empty stomach.   atenolol (TENORMIN) 50 MG tablet Take 50 mg by mouth every evening.   BREO ELLIPTA 200-25 MCG/INH AEPB Inhale 1 puff into the lungs daily.   Calcium Carb-Cholecalciferol (CALCIUM 500 + D PO) Take 1 tablet by mouth daily.   Cholecalciferol (VITAMIN D3) 50 MCG (2000 UT) TABS Take 1 tablet by mouth daily.   Cyanocobalamin 1000 MCG/ML KIT Inject as directed. Once A Day on the 10th of the Month   furosemide (LASIX) 20 MG tablet Take 20 mg by mouth daily.   loratadine (CLARITIN) 10 MG tablet Take 10 mg by mouth daily.   melatonin 3 MG TABS tablet Take 3 mg by mouth at bedtime.    Multiple Vitamins-Minerals (PRESERVISION AREDS 2) CAPS Take 1 capsule by mouth in the morning and at bedtime.   NON FORMULARY Diet:Regular   nystatin (MYCOSTATIN/NYSTOP) powder Apply 1 application topically daily as needed. Special Instructions: Apply thin layer of Nystatin powder to skin around stoma when changing colostomy bag PRN irritation.   pantoprazole (PROTONIX) 40 MG tablet Take 40 mg by mouth 2 (two) times daily.   potassium chloride SA (KLOR-CON) 20 MEQ tablet Take 20 mEq by mouth 2 (two) times daily.   No facility-administered encounter medications on file as of 07/06/2022.     SIGNIFICANT DIAGNOSTIC EXAMS  PREVIOUS   03-02-21: DEXA: t score -3.258  NO NEW LABS.   LABS REVIEWED PREVIOUS  10-17-21: wbc 5.9; hgb 14.9; hct 44.3; mcv 104.5 plt 267; glucose 97; bun 14; creat 0.66; k+ 3.4; na++ 137; ca 9.1; GFR>60; protein 7.3; albumin 3.8  04-10-22: wbc 6.1; hgb 12.5; hct 37.9; mcv 101.6 plt 196; glucose 86; bun 17; creat 0.67; k+ 3.3; na++ 139; ca 9.0; na++ 9.0; gfr >60; protein 6.1; albumin 3.2 vit D 45.08 05-15-22: glucose 87; bun 14; creat 0.63; k+ 3.0; na++ 140; ca 8.8; gf >60 05-22-22: k+ 3.6    TODAY  06-08-22: glucose 87; bun 14; creat 0.63; k+ 3.0; na++ 8.8; gfr >60  Review of Systems  Constitutional:  Negative for malaise/fatigue.  Respiratory:  Negative for cough and shortness of breath.   Cardiovascular:  Negative for chest pain, palpitations and leg swelling.  Gastrointestinal:  Negative for abdominal pain, constipation and heartburn.  Musculoskeletal:  Negative for back pain, joint pain and myalgias.  Skin: Negative.   Neurological:  Negative for dizziness.  Psychiatric/Behavioral:  The patient is not nervous/anxious.    Physical Exam Constitutional:      General: She is not in acute distress.    Appearance: She is well-developed. She is obese. She is not diaphoretic.  Neck:     Thyroid: No thyromegaly.  Cardiovascular:     Rate and Rhythm: Normal rate and  regular rhythm.     Pulses: Normal pulses.     Heart sounds: Normal heart sounds.  Pulmonary:     Effort: Pulmonary effort is normal. No respiratory distress.     Breath sounds: Normal breath sounds.  Abdominal:     General: Bowel sounds are normal. There is no distension.     Palpations: Abdomen is soft.     Tenderness: There is no abdominal tenderness.     Comments: Colostomy   Musculoskeletal:        General: Normal range of motion.     Cervical back: Neck supple.     Right lower leg: Edema present.     Left lower leg: Edema present.  Lymphadenopathy:     Cervical: No cervical adenopathy.  Skin:    General: Skin is warm and dry.  Neurological:     Mental Status: She is alert. Mental status is at baseline.  Psychiatric:        Mood and Affect: Mood normal.       ASSESSMENT/ PLAN:  TODAY  Major depression recurrent chronic: will continue cymbalta 40 mg daily and melatonin 3 mg nightly   2. Aortoiliac atherosclerosis (ct 08-21-20) not on statin due to advanced age   53. History of large bowel obstruction is s/p colostomy  4. Degenerative disc disease lumbar: will continue cymbalta 40 mg daily   PREVIOUS   5. Moderate persistent asthma in adult: stable will continue breo ellipta 200/25 mcg 1 puff daily   6. Macrocytic anemia: hgb 12.5 will monitor   7. Post menopausal osteoporosis t score -3.253 will continue fosamax 70 mg weekly   8. Vitamin B 12 deficiency: level 446 will continue monthly injections.   9. Bilateral lower extremity edema: will continue  lasix 20 mg daily with k+ 40 meq twice daily will repeat bmp   10. Non-seasonal allergic rhinitis unspecified trigger: will continue claritin 10 mg daily   11. Essential hypertension: b/p 126/75 will continue tenormin 50 mg daily is off hctz.   12. Hypokalemia:  k+ 3.0 will continue  k+  40 meq twice daily will check bmp on 07-10-22 .     Ok Edwards NP [facility] Medicine   call [phone]

## 2022-07-10 ENCOUNTER — Other Ambulatory Visit (HOSPITAL_COMMUNITY)
Admission: RE | Admit: 2022-07-10 | Discharge: 2022-07-10 | Disposition: A | Discharge status: Home / Self Care | Payer: Medicare Other | Source: Skilled Nursing Facility | Attending: Adult Health | Admitting: Adult Health

## 2022-07-10 DIAGNOSIS — N182 Chronic kidney disease, stage 2 (mild): Secondary | ICD-10-CM | POA: Insufficient documentation

## 2022-07-10 LAB — BASIC METABOLIC PANEL
Anion gap: 7 (ref 5–15)
BUN: 18 mg/dL (ref 8–23)
CO2: 25 mmol/L (ref 22–32)
Calcium: 9.1 mg/dL (ref 8.9–10.3)
Chloride: 109 mmol/L (ref 98–111)
Creatinine, Ser: 0.67 mg/dL (ref 0.44–1.00)
GFR, Estimated: >60 mL/min (ref >60)
Glucose, Bld: 84 mg/dL (ref 70–99)
Potassium: 3.9 mmol/L (ref 3.5–5.1)
Sodium: 141 mmol/L (ref 135–145)

## 2022-08-08 ENCOUNTER — Non-Acute Institutional Stay (SKILLED_NURSING_FACILITY): Payer: Medicare Other | Admitting: Internal Medicine

## 2022-08-08 ENCOUNTER — Encounter: Payer: Self-pay | Admitting: Internal Medicine

## 2022-08-08 DIAGNOSIS — M810 Age-related osteoporosis without current pathological fracture: Secondary | ICD-10-CM | POA: Diagnosis not present

## 2022-08-08 DIAGNOSIS — D539 Nutritional anemia, unspecified: Secondary | ICD-10-CM | POA: Diagnosis not present

## 2022-08-08 DIAGNOSIS — N182 Chronic kidney disease, stage 2 (mild): Secondary | ICD-10-CM | POA: Diagnosis not present

## 2022-08-08 DIAGNOSIS — Z853 Personal history of malignant neoplasm of breast: Secondary | ICD-10-CM | POA: Insufficient documentation

## 2022-08-08 DIAGNOSIS — E079 Disorder of thyroid, unspecified: Secondary | ICD-10-CM | POA: Diagnosis not present

## 2022-08-08 DIAGNOSIS — F339 Major depressive disorder, recurrent, unspecified: Secondary | ICD-10-CM

## 2022-08-08 NOTE — Assessment & Plan Note (Signed)
She is on a bisphosphonate, alendronate.  Bone turnover markers will be assessed.

## 2022-08-08 NOTE — Assessment & Plan Note (Signed)
CBC revealed no anemia in July of this year but macrocytosis was present with an MCV of 101.6.  Most current B12 was 06/02/2021 with a normal value at 447.  She remains on 1000 mcg of B12 monthly.

## 2022-08-08 NOTE — Assessment & Plan Note (Signed)
Clinically she is not depressed at this time and she denies such.

## 2022-08-08 NOTE — Assessment & Plan Note (Signed)
Renal function remains stable as CKD stage II.  Med list reviewed; no change indicated unless there is progression of CKD.

## 2022-08-08 NOTE — Assessment & Plan Note (Signed)
No mammograms on record in epic.  Discussed possible manual exam with NP.

## 2022-08-08 NOTE — Assessment & Plan Note (Addendum)
Because of PMH of thyroid "surgery" & osteoporosis;TSH which is not current will be updated.

## 2022-08-08 NOTE — Progress Notes (Unsigned)
NURSING HOME LOCATION:  [facility] ROOM NUMBER:  144 P  CODE STATUS:  DNR  PCP:  Ok Edwards NP  This is a nursing facility follow up visit of chronic medical diagnoses & to document compliance with Regulation 483.30 (c) in [employer] Phase 2 which mandates caregiver visit ( visits can alternate among physician, PA or NP as per statutes) within 10 days of 30 days / 60 days/ 90 days post admission to SNF date    Interim medical record and care since last SNF visit was updated with review of diagnostic studies and change in clinical status since last visit were documented.  HPI: She is a permanent resident of this facility with medical diagnoses of history of asthma, history of breast cancer, diverticulosis, GERD, essential hypertension, hypothyroidism, and sleep apnea. Significant surgeries and procedures include abdominal hysterectomy, thyroid surgery, ostomy for large bowel obstruction, and breast surgery for the malignancy.  Labs are current and reveal normal chemistries.  Creatinine was 0.67 with a GFR greater than 60 indicating CKD stage II.  CBC was last checked in July of this year and revealed no anemia but it did reveal macrocytosis with an MCV of 101.6.  B12 level was last checked 06/02/2021 and was normal at 447.  She is on 1000 mcg of B12 as injection monthly.  TSH is not current.  Review of systems: She appears to have some neurocognitive issues.  When asked how she were doing the response was "all right I think."  When I ask about the thyroid surgery she the response was "I forgot now"; she could not provide the date or the reason for any thyroid surgery.  Same response was given and answer to queries about history of breast cancer.  She stated that she had a "little thing or something" removed from the left breast.  She has a diagnosis of osteoporosis and is on the bisphosphonate.  She states that she does not do "much walking."  Review of  systems was negative except for some nasal congestion and "a little cough."  Constitutional: No fever, significant weight change, fatigue  Eyes: No redness, discharge, pain, vision change ENT/mouth: No nasal congestion,  purulent discharge, earache, change in hearing, sore throat  Cardiovascular: No chest pain, palpitations, paroxysmal nocturnal dyspnea, claudication, edema  Respiratory: No cough, sputum production, hemoptysis, DOE, significant snoring, apnea   Gastrointestinal: No heartburn, dysphagia, abdominal pain, nausea /vomiting, rectal bleeding, melena, change in bowels Genitourinary: No dysuria, hematuria, pyuria, incontinence, nocturia Musculoskeletal: No joint stiffness, joint swelling, weakness, pain Dermatologic: No rash, pruritus, change in appearance of skin Neurologic: No dizziness, headache, syncope, seizures, numbness, tingling Psychiatric: No significant anxiety, depression, insomnia, anorexia Endocrine: No change in hair/skin/nails, excessive thirst, excessive hunger, excessive urination  Hematologic/lymphatic: No significant bruising, lymphadenopathy, abnormal bleeding Allergy/immunology: No itchy/watery eyes, significant sneezing, urticaria, angioedema  Physical exam:  Pertinent or positive findings: She is hard of hearing.  Ptosis is present, greater on the left.  She has complete dentures.  Breath sounds are decreased.  Second heart sound is accentuated.  Central obesity is present.  Ostomy is noted in the left abdomen.  Pedal pulses are decreased.  She has 1/2+ edema at the sock line.  There is fusiform enlargement of the knees.  Isolated OA changes of the hands are present with some lateral deviation.  Lower extremities are stronger than upper extremities to opposition. General appearance: Adequately nourished; no acute distress, increased work of breathing is  present.   Lymphatic: No lymphadenopathy about the head, neck, axilla. Eyes: No conjunctival inflammation or  lid edema is present. There is no scleral icterus. Ears:  External ear exam shows no significant lesions or deformities.   Nose:  External nasal examination shows no deformity or inflammation. Nasal mucosa are pink and moist without lesions, exudates Oral exam:  Lips and gums are healthy appearing. There is no oropharyngeal erythema or exudate. Neck:  No thyromegaly, masses, tenderness noted.    Heart:  Normal rate and regular rhythm. S1 and S2 normal without gallop, murmur, click, rub .  Lungs: Chest clear to auscultation without wheezes, rhonchi, rales, rubs. Abdomen: Bowel sounds are normal. Abdomen is soft and nontender with no organomegaly, hernias, masses. GU: Deferred  Extremities:  No cyanosis, clubbing, edema  Neurologic exam : Cn 2-7 intact Strength equal  in upper & lower extremities Balance, Rhomberg, finger to nose testing could not be completed due to clinical state Deep tendon reflexes are equal Skin: Warm & dry w/o tenting. No significant lesions or rash.  See summary under each active problem in the Problem List with associated updated therapeutic plan

## 2022-08-08 NOTE — Patient Instructions (Signed)
See assessment and plan under each diagnosis in the problem list and acutely for this visit 

## 2022-08-10 ENCOUNTER — Other Ambulatory Visit (HOSPITAL_COMMUNITY)
Admission: RE | Admit: 2022-08-10 | Discharge: 2022-08-10 | Disposition: A | Discharge status: Home / Self Care | Payer: Medicare Other | Source: Skilled Nursing Facility | Attending: Adult Health | Admitting: Adult Health

## 2022-08-10 DIAGNOSIS — I10 Essential (primary) hypertension: Secondary | ICD-10-CM | POA: Diagnosis not present

## 2022-08-10 LAB — TSH: TSH: 1.815 u[IU]/mL (ref 0.350–4.500)

## 2022-08-25 ENCOUNTER — Encounter: Payer: Self-pay | Admitting: Adult Health

## 2022-08-25 ENCOUNTER — Non-Acute Institutional Stay (SKILLED_NURSING_FACILITY): Payer: Medicare Other | Admitting: Adult Health

## 2022-08-25 DIAGNOSIS — E440 Moderate protein-calorie malnutrition: Secondary | ICD-10-CM

## 2022-08-25 DIAGNOSIS — F339 Major depressive disorder, recurrent, unspecified: Secondary | ICD-10-CM | POA: Diagnosis not present

## 2022-08-25 DIAGNOSIS — Z933 Colostomy status: Secondary | ICD-10-CM

## 2022-08-25 NOTE — Progress Notes (Signed)
Location:  [place] Room Number: 144-P Place of Service:  SNF (31)   CODE STATUS: DNR  No Known Allergies  Chief Complaint  Patient presents with   Acute Visit    Care plan meeting     HPI:  We have come together for her care plan meeting. BIMS 15/15 mood 0/30. Is nonambulatory without falls. She requires limited to extensive assist with her adls. She is occasionally incontinent of bladder has colostomy. Dietary: weight is 186 pounds; feeds self regular diet 75-100% appetite. Therapy: none at this time. Activities: does participate. She continues to be followed for her chronic illnesses including:  Major depression recurrent chronic  Colostomy status  Moderate protein calorie malnutrition   Past Medical History:  Diagnosis Date   Anxiety    Arthritis    Asthma    Cancer ([place])    breast   Depression    Diverticulosis    GERD (gastroesophageal reflux disease)    HTN (hypertension)    Shortness of breath    exertion   Sleep apnea    STOP BANG 4   Thyroid dysfunction     Past Surgical History:  Procedure Laterality Date   ABDOMINAL HYSTERECTOMY     arthroscopic surgery rt knee  1997   BREAST SURGERY  2001   LEFT/ CANCER    broken radius ulna  1990   left   broken right ankle  1976   EXPLORATORY LAPAROTOMY  08/24/2020   with partial sigmoidectomy creat of end colostomy and application of negative pressure dressing.      JOINT REPLACEMENT  right knee 2005 [place]   left knee  2004   rt foot bone spur removed  [place]  01/22/2012   Procedure: TOTAL KNEE ARTHROPLASTY;  Surgeon: Carole Civil, MD;  Location: [facility];  Service: Orthopedics;  Laterality: Left;   TUBAL LIGATION      Social History   Socioeconomic History   Marital status: Widowed    Spouse name: Not on file   Number of children: Not on file   Years of education: Not on file   Highest education level: Not on file   Occupational History   Not on file  Tobacco Use   Smoking status: Former   Smokeless tobacco: Former  Scientific laboratory technician Use: Never used  Substance and Sexual Activity   Alcohol use: No   Drug use: No   Sexual activity: Never  Other Topics Concern   Not on file  Social History Narrative   Not on file   Social Determinants of Health   Financial Resource Strain: Not on file  Food Insecurity: Not on file  Transportation Needs: Not on file  Physical Activity: Not on file  Stress: Not on file  Social Connections: Not on file  Intimate Partner Violence: Not on file   Family History  Problem Relation Age of Onset   Pseudochol deficiency Neg Hx    Malignant hyperthermia Neg Hx    Hypotension Neg Hx    Anesthesia problems Neg Hx       VITAL SIGNS BP 124/68   Pulse 66   Ht _0  (1.448 m)   Wt 186 lb (84.4 kg)   BMI 40.25 kg/m   Outpatient Encounter Medications as of 08/25/2022  Medication Sig   acetaminophen (TYLENOL) 500 MG tablet Take 500 mg by mouth every 8 (eight) hours as needed.  acetaminophen (TYLENOL) 500 MG tablet Take 500 mg by mouth at bedtime.   alendronate (FOSAMAX) 70 MG tablet Take 70 mg by mouth once a week. Every Sunday take with a full glass of water on an empty stomach.   atenolol (TENORMIN) 50 MG tablet Take 50 mg by mouth every evening.   BREO ELLIPTA 200-25 MCG/INH AEPB Inhale 1 puff into the lungs daily.   Calcium Carb-Cholecalciferol (CALCIUM 500 + D PO) Take 1 tablet by mouth daily.   Cholecalciferol (VITAMIN D3) 50 MCG (2000 UT) TABS Take 1 tablet by mouth daily.   Cyanocobalamin 1000 MCG/ML KIT Inject as directed. Once A Day on the 10th of the Month   furosemide (LASIX) 20 MG tablet Take 20 mg by mouth daily.   loratadine (CLARITIN) 10 MG tablet Take 10 mg by mouth daily.   melatonin 3 MG TABS tablet Take 3 mg by mouth at bedtime.   Multiple Vitamins-Minerals (PRESERVISION AREDS 2) CAPS Take 1 capsule by mouth in the morning and at  bedtime.   NON FORMULARY Diet:Regular   nystatin (MYCOSTATIN/NYSTOP) powder Apply 1 application topically daily as needed. Special Instructions: Apply thin layer of Nystatin powder to skin around stoma when changing colostomy bag PRN irritation.   pantoprazole (PROTONIX) 40 MG tablet Take 40 mg by mouth 2 (two) times daily.   potassium chloride SA (KLOR-CON) 20 MEQ tablet Take 20 mEq by mouth 2 (two) times daily.   No facility-administered encounter medications on file as of 08/25/2022.     SIGNIFICANT DIAGNOSTIC EXAMS  PREVIOUS   03-02-21: DEXA: t score -3.258  NO NEW LABS.   LABS REVIEWED PREVIOUS  10-17-21: wbc 5.9; hgb 14.9; hct 44.3; mcv 104.5 plt 267; glucose 97; bun 14; creat 0.66; k+ 3.4; na++ 137; ca 9.1; GFR>60; protein 7.3; albumin 3.8  04-10-22: wbc 6.1; hgb 12.5; hct 37.9; mcv 101.6 plt 196; glucose 86; bun 17; creat 0.67; k+ 3.3; na++ 139; ca 9.0; na++ 9.0; gfr >60; protein 6.1; albumin 3.2 vit D 45.08 05-15-22: glucose 87; bun 14; creat 0.63; k+ 3.0; na++ 140; ca 8.8; gf >60 05-22-22: k+ 3.6   06-08-22: glucose 87; bun 14; creat 0.63; k+ 3.0; na++ 8.8; gfr >60  NO NEW LABS.   Review of Systems  Constitutional:  Negative for malaise/fatigue.  Respiratory:  Negative for cough and shortness of breath.   Cardiovascular:  Negative for chest pain, palpitations and leg swelling.  Gastrointestinal:  Negative for abdominal pain, constipation and heartburn.  Musculoskeletal:  Negative for back pain, joint pain and myalgias.  Skin: Negative.   Neurological:  Negative for dizziness.  Psychiatric/Behavioral:  The patient is not nervous/anxious.    Physical Exam Constitutional:      General: She is not in acute distress.    Appearance: She is well-developed. She is obese. She is not diaphoretic.  Neck:     Thyroid: No thyromegaly.  Cardiovascular:     Rate and Rhythm: Normal rate and regular rhythm.     Heart sounds: Normal heart sounds.  Pulmonary:     Effort: Pulmonary  effort is normal. No respiratory distress.     Breath sounds: Normal breath sounds.  Abdominal:     General: Bowel sounds are normal. There is no distension.     Palpations: Abdomen is soft.     Tenderness: There is no abdominal tenderness.     Comments: Colostomy   Musculoskeletal:        General: Normal range of motion.  Cervical back: Neck supple.     Right lower leg: Edema present.     Left lower leg: Edema present.  Lymphadenopathy:     Cervical: No cervical adenopathy.  Skin:    General: Skin is warm and dry.  Neurological:     Mental Status: She is alert. Mental status is at baseline.  Psychiatric:        Mood and Affect: Mood normal.      ASSESSMENT/ PLAN:  TODAY  Major depression recurrent chronic Colostomy status Moderate protein calorie malnutrition  Will continue current medications Will continue current plan of care Will continue to monitor her status.   Time spent with patient: 40 minutes: plan of care medications dietary   Ok Edwards NP [facility]   call [phone]

## 2022-09-06 ENCOUNTER — Encounter: Payer: Self-pay | Admitting: Adult Health

## 2022-09-06 ENCOUNTER — Non-Acute Institutional Stay (SKILLED_NURSING_FACILITY): Payer: Medicare Other | Admitting: Adult Health

## 2022-09-06 DIAGNOSIS — E538 Deficiency of other specified B group vitamins: Secondary | ICD-10-CM

## 2022-09-06 DIAGNOSIS — M810 Age-related osteoporosis without current pathological fracture: Secondary | ICD-10-CM | POA: Diagnosis not present

## 2022-09-06 DIAGNOSIS — D539 Nutritional anemia, unspecified: Secondary | ICD-10-CM

## 2022-09-06 DIAGNOSIS — J4540 Moderate persistent asthma, uncomplicated: Secondary | ICD-10-CM | POA: Diagnosis not present

## 2022-09-06 NOTE — Progress Notes (Signed)
Location:  [place] Room Number: 144 Place of Service:  SNF (31)   CODE STATUS: dnr   No Known Allergies  Chief Complaint  Patient presents with   Medical Management of Chronic Issues                           Moderate persistent asthma in adult: Macrocytic anemia:  Post menopausal osteoporosis Vitamin B 12 deficiency    HPI:  She is a 88 year old long term resident of this facility being seen for the management of her chronic illnesses: Moderate persistent asthma in adult: Macrocytic anemia:  Post menopausal osteoporosis Vitamin B 12 deficiency. There are no reports of uncontrolled pain; no reports of depressive or anxious thoughts. No reports of cough or shortness of breath.   Past Medical History:  Diagnosis Date   Anxiety    Arthritis    Asthma    Cancer ([place])    breast   Depression    Diverticulosis    GERD (gastroesophageal reflux disease)    HTN (hypertension)    Shortness of breath    exertion   Sleep apnea    STOP BANG 4   Thyroid dysfunction     Past Surgical History:  Procedure Laterality Date   ABDOMINAL HYSTERECTOMY     arthroscopic surgery rt knee  1997   BREAST SURGERY  2001   LEFT/ CANCER    broken radius ulna  1990   left   broken right ankle  1976   EXPLORATORY LAPAROTOMY  08/24/2020   with partial sigmoidectomy creat of end colostomy and application of negative pressure dressing.      JOINT REPLACEMENT  right knee 2005 [facility]   left knee  2004   rt foot bone spur removed  [place]  01/22/2012   Procedure: TOTAL KNEE ARTHROPLASTY;  Surgeon: Carole Civil, MD;  Location: [facility];  Service: Orthopedics;  Laterality: Left;   TUBAL LIGATION      Social History   Socioeconomic History   Marital status: Widowed    Spouse name: Not on file   Number of children: Not on file   Years of education: Not on file   Highest education level: Not on file  Occupational  History   Not on file  Tobacco Use   Smoking status: Former   Smokeless tobacco: Former  Scientific laboratory technician Use: Never used  Substance and Sexual Activity   Alcohol use: No   Drug use: No   Sexual activity: Never  Other Topics Concern   Not on file  Social History Narrative   Not on file   Social Determinants of Health   Financial Resource Strain: Not on file  Food Insecurity: Not on file  Transportation Needs: Not on file  Physical Activity: Not on file  Stress: Not on file  Social Connections: Not on file  Intimate Partner Violence: Not on file   Family History  Problem Relation Age of Onset   Pseudochol deficiency Neg Hx    Malignant hyperthermia Neg Hx    Hypotension Neg Hx    Anesthesia problems Neg Hx       VITAL SIGNS BP 128/78   Pulse 68   Temp (!) 97.1 F (36.2 C)   Resp 18   Ht _0  (1.448 m)   Wt 180 lb 3.2 oz (81.7  kg)   SpO2 98%   BMI 38.99 kg/m   Outpatient Encounter Medications as of 09/06/2022  Medication Sig   potassium chloride SA (KLOR-CON) 20 MEQ tablet Take 40 mEq by mouth 2 (two) times daily.   acetaminophen (TYLENOL) 500 MG tablet Take 500 mg by mouth every 8 (eight) hours as needed.   acetaminophen (TYLENOL) 500 MG tablet Take 500 mg by mouth at bedtime.   alendronate (FOSAMAX) 70 MG tablet Take 70 mg by mouth once a week. Every Sunday take with a full glass of water on an empty stomach.   atenolol (TENORMIN) 50 MG tablet Take 50 mg by mouth every evening.   BREO ELLIPTA 200-25 MCG/INH AEPB Inhale 1 puff into the lungs daily.   Calcium Carb-Cholecalciferol (CALCIUM 500 + D PO) Take 1 tablet by mouth daily.   Cholecalciferol (VITAMIN D3) 50 MCG (2000 UT) TABS Take 1 tablet by mouth daily.   Cyanocobalamin 1000 MCG/ML KIT Inject as directed. Once A Day on the 10th of the Month   furosemide (LASIX) 20 MG tablet Take 20 mg by mouth daily.   loratadine (CLARITIN) 10 MG tablet Take 10 mg by mouth daily.   melatonin 3 MG TABS tablet Take  3 mg by mouth at bedtime.   Multiple Vitamins-Minerals (PRESERVISION AREDS 2) CAPS Take 1 capsule by mouth in the morning and at bedtime.   NON FORMULARY Diet:Regular   nystatin (MYCOSTATIN/NYSTOP) powder Apply 1 application topically daily as needed. Special Instructions: Apply thin layer of Nystatin powder to skin around stoma when changing colostomy bag PRN irritation.   pantoprazole (PROTONIX) 40 MG tablet Take 40 mg by mouth 2 (two) times daily.   No facility-administered encounter medications on file as of 09/06/2022.     SIGNIFICANT DIAGNOSTIC EXAMS  PREVIOUS   03-02-21: DEXA: t score -3.258  NO NEW LABS.   LABS REVIEWED PREVIOUS  10-17-21: wbc 5.9; hgb 14.9; hct 44.3; mcv 104.5 plt 267; glucose 97; bun 14; creat 0.66; k+ 3.4; na++ 137; ca 9.1; GFR>60; protein 7.3; albumin 3.8  04-10-22: wbc 6.1; hgb 12.5; hct 37.9; mcv 101.6 plt 196; glucose 86; bun 17; creat 0.67; k+ 3.3; na++ 139; ca 9.0; na++ 9.0; gfr >60; protein 6.1; albumin 3.2 vit D 45.08 05-15-22: glucose 87; bun 14; creat 0.63; k+ 3.0; na++ 140; ca 8.8; gf >60 05-22-22: k+ 3.6    TODAY  06-08-22: glucose 87; bun 14; creat 0.63; k+ 3.0; na++ 8.8; gfr >60 08-10-22: tsh 1.815  Review of Systems  Constitutional:  Negative for malaise/fatigue.  Respiratory:  Negative for cough and shortness of breath.   Cardiovascular:  Negative for chest pain, palpitations and leg swelling.  Gastrointestinal:  Negative for abdominal pain, constipation and heartburn.  Musculoskeletal:  Negative for back pain, joint pain and myalgias.  Skin: Negative.   Neurological:  Negative for dizziness.  Psychiatric/Behavioral:  The patient is not nervous/anxious.    Physical Exam Constitutional:      General: She is not in acute distress.    Appearance: She is well-developed. She is obese. She is not diaphoretic.  Neck:     Thyroid: No thyromegaly.  Cardiovascular:     Rate and Rhythm: Normal rate and regular rhythm.     Pulses: Normal  pulses.     Heart sounds: Normal heart sounds.  Pulmonary:     Effort: Pulmonary effort is normal. No respiratory distress.     Breath sounds: Normal breath sounds.  Abdominal:     General: Bowel  sounds are normal. There is no distension.     Palpations: Abdomen is soft.     Tenderness: There is no abdominal tenderness.     Comments: Colostomy   Musculoskeletal:        General: Normal range of motion.     Cervical back: Neck supple.     Right lower leg: No edema.     Left lower leg: No edema.  Lymphadenopathy:     Cervical: No cervical adenopathy.  Skin:    General: Skin is warm and dry.  Neurological:     Mental Status: She is alert. Mental status is at baseline.  Psychiatric:        Mood and Affect: Mood normal.        ASSESSMENT/ PLAN:  TODAY  Moderate persistent asthma in adult: will continue breo ellipta 200/25 mcg 1 puff daily   2. Macrocytic anemia: hgb 12.5  3. Post menopausal osteoporosis t score -3.253 will continue fosamax 70 mg weekly   4. Vitamin B 12 deficiency: level 446 will continue monthly injections.    PREVIOUS   5. Bilateral lower extremity edema: will continue  lasix 20 mg daily with k+ 40 meq twice daily  6. Non-seasonal allergic rhinitis unspecified trigger: will continue claritin 10 mg daily   7. Essential hypertension: b/p 128/78 will continue tenormin 50 mg daily is off hctz.   8. Hypokalemia:  k+ 3.0 will continue  k+  40 meq twice daily will check bmp on 07-10-22 .   9. Major depression recurrent chronic: will continue  melatonin 3 mg nightly   10. Aortoiliac atherosclerosis (ct 08-21-20) not on statin due to advanced age   66. History of large bowel obstruction is s/p colostomy  12. Degenerative disc disease lumbar: will continue tylenol 500 mg nightly     Ok Edwards NP [facility]   call [phone]

## 2022-10-02 DIAGNOSIS — Z20828 Contact with and (suspected) exposure to other viral communicable diseases: Secondary | ICD-10-CM | POA: Diagnosis not present

## 2022-10-12 ENCOUNTER — Encounter: Payer: Self-pay | Admitting: Adult Health

## 2022-10-12 ENCOUNTER — Non-Acute Institutional Stay (SKILLED_NURSING_FACILITY): Payer: Medicare Other | Admitting: Adult Health

## 2022-10-12 DIAGNOSIS — N182 Chronic kidney disease, stage 2 (mild): Secondary | ICD-10-CM

## 2022-10-12 DIAGNOSIS — J3089 Other allergic rhinitis: Secondary | ICD-10-CM

## 2022-10-12 DIAGNOSIS — I129 Hypertensive chronic kidney disease with stage 1 through stage 4 chronic kidney disease, or unspecified chronic kidney disease: Secondary | ICD-10-CM

## 2022-10-12 DIAGNOSIS — E876 Hypokalemia: Secondary | ICD-10-CM

## 2022-10-12 DIAGNOSIS — Z933 Colostomy status: Secondary | ICD-10-CM

## 2022-10-12 DIAGNOSIS — R600 Localized edema: Secondary | ICD-10-CM | POA: Diagnosis not present

## 2022-10-12 NOTE — Progress Notes (Unsigned)
Location:  [place] Room Number: 144-P Place of Service:  SNF (31)   CODE STATUS: DNR  No Known Allergies  Chief Complaint  Patient presents with   Medical Management of Chronic Issues                  Bilateral lower extremity edema: Non-seasonal allergic rhinitis unspecified trigger:  Essential hypertension  Hypokalemia    HPI:  She is a 89 year old long term resident of this facility being seen for the management of her chronic illnesses: Bilateral lower extremity edema: Non-seasonal allergic rhinitis unspecified trigger:  Essential hypertension  Hypokalemia. She does get out of bed daily; she is sociable; she denies any pain. No cough or shortness of breath   Past Medical History:  Diagnosis Date   Anxiety    Arthritis    Asthma    Cancer ([place])    breast   Depression    Diverticulosis    GERD (gastroesophageal reflux disease)    HTN (hypertension)    Shortness of breath    exertion   Sleep apnea    STOP BANG 4   Thyroid dysfunction     Past Surgical History:  Procedure Laterality Date   ABDOMINAL HYSTERECTOMY     arthroscopic surgery rt knee  1997   BREAST SURGERY  2001   LEFT/ CANCER    broken radius ulna  1990   left   broken right ankle  1976   EXPLORATORY LAPAROTOMY  08/24/2020   with partial sigmoidectomy creat of end colostomy and application of negative pressure dressing.      JOINT REPLACEMENT  right knee 2005 [facility]   left knee  2004   rt foot bone spur removed  [place]  01/22/2012   Procedure: TOTAL KNEE ARTHROPLASTY;  Surgeon: Carole Civil, MD;  Location: [facility];  Service: Orthopedics;  Laterality: Left;   TUBAL LIGATION      Social History   Socioeconomic History   Marital status: Widowed    Spouse name: Not on file   Number of children: Not on file   Years of education: Not on file   Highest education level: Not on file  Occupational History   Not on file   Tobacco Use   Smoking status: Former   Smokeless tobacco: Former  Scientific laboratory technician Use: Never used  Substance and Sexual Activity   Alcohol use: No   Drug use: No   Sexual activity: Never  Other Topics Concern   Not on file  Social History Narrative   Not on file   Social Determinants of Health   Financial Resource Strain: Not on file  Food Insecurity: Not on file  Transportation Needs: Not on file  Physical Activity: Not on file  Stress: Not on file  Social Connections: Not on file  Intimate Partner Violence: Not on file   Family History  Problem Relation Age of Onset   Pseudochol deficiency Neg Hx    Malignant hyperthermia Neg Hx    Hypotension Neg Hx    Anesthesia problems Neg Hx       VITAL SIGNS BP 118/68   Pulse 68   Temp 98 F (36.7 C)   Resp 20   Ht '4\' 9"'$  (1.448 m)   Wt 181 lb 11.2 oz (82.4 kg)   SpO2 96%   BMI 39.32 kg/m   Outpatient Encounter Medications as  of 10/12/2022  Medication Sig   acetaminophen (TYLENOL) 500 MG tablet Take 500 mg by mouth every 8 (eight) hours as needed.   acetaminophen (TYLENOL) 500 MG tablet Take 500 mg by mouth at bedtime.   alendronate (FOSAMAX) 70 MG tablet Take 70 mg by mouth once a week. Every Sunday take with a full glass of water on an empty stomach.   atenolol (TENORMIN) 50 MG tablet Take 50 mg by mouth every evening.   BREO ELLIPTA 200-25 MCG/INH AEPB Inhale 1 puff into the lungs daily.   Calcium Carb-Cholecalciferol (CALCIUM 500 + D PO) Take 1 tablet by mouth daily.   Cholecalciferol (VITAMIN D3) 50 MCG (2000 UT) TABS Take 1 tablet by mouth daily.   Cyanocobalamin 1000 MCG/ML KIT Inject as directed. Once A Day on the 10th of the Month   furosemide (LASIX) 20 MG tablet Take 20 mg by mouth daily.   loratadine (CLARITIN) 10 MG tablet Take 10 mg by mouth daily.   melatonin 3 MG TABS tablet Take 3 mg by mouth at bedtime.   Multiple Vitamins-Minerals (PRESERVISION AREDS 2) CAPS Take 1 capsule by mouth in the  morning and at bedtime.   NON FORMULARY Diet:Regular   nystatin (MYCOSTATIN/NYSTOP) powder Apply 1 application topically daily as needed. Special Instructions: Apply thin layer of Nystatin powder to skin around stoma when changing colostomy bag PRN irritation.   pantoprazole (PROTONIX) 40 MG tablet Take 40 mg by mouth 2 (two) times daily.   potassium chloride SA (KLOR-CON) 20 MEQ tablet Take 40 mEq by mouth 2 (two) times daily.   No facility-administered encounter medications on file as of 10/12/2022.     SIGNIFICANT DIAGNOSTIC EXAMS  PREVIOUS   03-02-21: DEXA: t score -3.258  NO NEW LABS.   LABS REVIEWED PREVIOUS  10-17-21: wbc 5.9; hgb 14.9; hct 44.3; mcv 104.5 plt 267; glucose 97; bun 14; creat 0.66; k+ 3.4; na++ 137; ca 9.1; GFR>60; protein 7.3; albumin 3.8  04-10-22: wbc 6.1; hgb 12.5; hct 37.9; mcv 101.6 plt 196; glucose 86; bun 17; creat 0.67; k+ 3.3; na++ 139; ca 9.0; na++ 9.0; gfr >60; protein 6.1; albumin 3.2 vit D 45.08 05-15-22: glucose 87; bun 14; creat 0.63; k+ 3.0; na++ 140; ca 8.8; gf >60 05-22-22: k+ 3.6   06-08-22: glucose 87; bun 14; creat 0.63; k+ 3.0; na++ 8.8; gfr >60 08-10-22: tsh 1.815  NO NEW LABS.   Review of Systems  Constitutional:  Negative for malaise/fatigue.  Respiratory:  Negative for cough and shortness of breath.   Cardiovascular:  Negative for chest pain, palpitations and leg swelling.  Gastrointestinal:  Negative for abdominal pain, constipation and heartburn.  Musculoskeletal:  Negative for back pain, joint pain and myalgias.  Skin: Negative.   Neurological:  Negative for dizziness.  Psychiatric/Behavioral:  The patient is not nervous/anxious.    Physical Exam Constitutional:      General: She is not in acute distress.    Appearance: She is well-developed. She is obese. She is not diaphoretic.  Neck:     Thyroid: No thyromegaly.  Cardiovascular:     Rate and Rhythm: Normal rate and regular rhythm.     Pulses: Normal pulses.     Heart  sounds: Normal heart sounds.  Pulmonary:     Effort: Pulmonary effort is normal. No respiratory distress.     Breath sounds: Normal breath sounds.  Abdominal:     General: Bowel sounds are normal. There is no distension.     Palpations: Abdomen is  soft.     Tenderness: There is no abdominal tenderness.     Comments: colostomy  Musculoskeletal:        General: Normal range of motion.     Cervical back: Neck supple.     Right lower leg: No edema.     Left lower leg: No edema.  Lymphadenopathy:     Cervical: No cervical adenopathy.  Skin:    General: Skin is warm and dry.  Neurological:     Mental Status: She is alert. Mental status is at baseline.  Psychiatric:        Mood and Affect: Mood normal.          ASSESSMENT/ PLAN:  TODAY  Bilateral lower extremity edema: is on lasix 20 mg daily with k+ 40 meq twice daily   2. Non-seasonal allergic rhinitis unspecified trigger: will continue claritin 10 mg daily   3. Essential hypertension b/p 118/68 will continue tenormin 50 mg daily   4. Hypokalemia k+ is 3.0 will continue k+ 40 meq twice daily and will follow up labs.    PREVIOUS   5. Major depression recurrent chronic: will continue  melatonin 3 mg nightly   6. Aortoiliac atherosclerosis (ct 08-21-20) not on statin due to advanced age   53. History of large bowel obstruction is s/p colostomy  8. Degenerative disc disease lumbar: will continue tylenol 500 mg nightly   9. Moderate persistent asthma in adult: will continue breo ellipta 200/25 mcg 1 puff daily   10. Macrocytic anemia: hgb 12.5  11. Post menopausal osteoporosis t score -3.253 will continue fosamax 70 mg weekly   12 . Vitamin B 12 deficiency: level 446 will continue monthly injections.      Ok Edwards NP [facility]  call [phone]

## 2022-10-23 ENCOUNTER — Other Ambulatory Visit (HOSPITAL_COMMUNITY)
Admission: RE | Admit: 2022-10-23 | Discharge: 2022-10-23 | Disposition: A | Discharge status: Home / Self Care | Payer: Medicare Other | Source: Skilled Nursing Facility | Attending: Adult Health | Admitting: Adult Health

## 2022-10-23 DIAGNOSIS — I10 Essential (primary) hypertension: Secondary | ICD-10-CM | POA: Insufficient documentation

## 2022-10-23 LAB — COMPREHENSIVE METABOLIC PANEL
ALT: 11 U/L (ref 0–44)
AST: 18 U/L (ref 15–41)
Albumin: 3.2 g/dL — ABNORMAL LOW (ref 3.5–5.0)
Alkaline Phosphatase: 46 U/L (ref 38–126)
Anion gap: 10 (ref 5–15)
BUN: 14 mg/dL (ref 8–23)
CO2: 24 mmol/L (ref 22–32)
Calcium: 8.9 mg/dL (ref 8.9–10.3)
Chloride: 106 mmol/L (ref 98–111)
Creatinine, Ser: 0.71 mg/dL (ref 0.44–1.00)
GFR, Estimated: >60 mL/min (ref >60)
Glucose, Bld: 82 mg/dL (ref 70–99)
Potassium: 4 mmol/L (ref 3.5–5.1)
Sodium: 140 mmol/L (ref 135–145)
Total Bilirubin: 0.7 mg/dL (ref 0.3–1.2)
Total Protein: 6.1 g/dL — ABNORMAL LOW (ref 6.5–8.1)

## 2022-10-23 LAB — CBC
HCT: 37.5 % (ref 36.0–46.0)
Hemoglobin: 12.3 g/dL (ref 12.0–15.0)
MCH: 33.6 pg (ref 26.0–34.0)
MCHC: 32.8 g/dL (ref 30.0–36.0)
MCV: 102.5 fL — ABNORMAL HIGH (ref 80.0–100.0)
Platelets: 189 10*3/uL (ref 150–400)
RBC: 3.66 MIL/uL — ABNORMAL LOW (ref 3.87–5.11)
RDW: 11.9 % (ref 11.5–15.5)
WBC: 5.3 10*3/uL (ref 4.0–10.5)
nRBC: 0 % (ref 0.0–0.2)

## 2022-11-10 ENCOUNTER — Encounter: Payer: Self-pay | Admitting: Internal Medicine

## 2022-11-10 ENCOUNTER — Non-Acute Institutional Stay (SKILLED_NURSING_FACILITY): Payer: Medicare Other | Admitting: Internal Medicine

## 2022-11-10 DIAGNOSIS — E079 Disorder of thyroid, unspecified: Secondary | ICD-10-CM

## 2022-11-10 DIAGNOSIS — E538 Deficiency of other specified B group vitamins: Secondary | ICD-10-CM | POA: Diagnosis not present

## 2022-11-10 DIAGNOSIS — I10 Essential (primary) hypertension: Secondary | ICD-10-CM

## 2022-11-10 DIAGNOSIS — L308 Other specified dermatitis: Secondary | ICD-10-CM

## 2022-11-10 DIAGNOSIS — E440 Moderate protein-calorie malnutrition: Secondary | ICD-10-CM

## 2022-11-10 NOTE — Assessment & Plan Note (Addendum)
10/23/2022 macrocytosis persists with an MCV of 102.5 but there is no anemia; H/H is 12.3/37.5.  She is on a B12 supplement of 1000 mcg daily. Last B12 level 447 on 06/02/21.  Update B12 level with next blood draw.

## 2022-11-10 NOTE — Patient Instructions (Signed)
See assessment and plan under each diagnosis in the problem list and acutely for this visit 

## 2022-11-10 NOTE — Assessment & Plan Note (Signed)
08/10/2022 TSH was therapeutic at 1.815; she is not on thyroid supplement.  Again she cannot provide any history as to any prior thyroid surgery.

## 2022-11-10 NOTE — Assessment & Plan Note (Signed)
BP controlled; no change in antihypertensive medications  

## 2022-11-10 NOTE — Progress Notes (Signed)
NURSING HOME LOCATION:  [facility] ROOM NUMBER:  144 P  CODE STATUS:    PCP:  Ok Edwards NP  This is a nursing facility follow up visit of chronic medical diagnoses & to document compliance with Regulation 483.30 (c) in The [place] Manual Phase 2 which mandates caregiver visit ( visits can alternate among physician, PA or NP as per statutes) within 10 days of 30 days / 60 days/ 90 days post admission to SNF date    Interim medical record and care since last SNF visit was updated with review of diagnostic studies and change in clinical status since last visit were documented.  HPI: She is a permanent resident of this facility with medical diagnoses of degenerative joint disease, history of asthma, history of breast cancer, history of anxiety/depression, history of diverticulosis, GERD, essential hypertension, and sleep apnea.  Past medical history includes thyroid surgery in 1983; she can provide no additional information.  Most recent labs were performed 1/29.  Protein/caloric malnutrition was suggested by an albumin of 3.2 and total protein 6.1.  GFR was greater than 60 indicating CKD stage II.  Although macrocytosis is present with an MCV of 102.5 there is no anemia; H/H was 12.3/37.5. Last B12 on record was 447 on 06/02/21.  The most recent TSH on record was 08/10/2022 and was therapeutic with a value of 1.815.  Review of systems: She states "I am all right, I guess."  Again when asked about the thyroid surgery her response was "so long ago I do not remember."  She describes pruritus over the upper back bilaterally.  The remainder of the review of systems is negative.  Constitutional: No fever, significant weight change, fatigue  Eyes: No redness, discharge, pain, vision change ENT/mouth: No nasal congestion,  purulent discharge, earache, change in hearing, sore throat  Cardiovascular: No chest pain, palpitations, paroxysmal nocturnal dyspnea,  claudication, edema  Respiratory: No cough, sputum production, hemoptysis, DOE, significant snoring, apnea   Gastrointestinal: No heartburn, dysphagia, abdominal pain, nausea /vomiting, rectal bleeding, melena, change in bowels Genitourinary: No dysuria, hematuria, pyuria, incontinence, nocturia Musculoskeletal: No joint stiffness, joint swelling, weakness, pain Neurologic: No dizziness, headache, syncope, seizures, numbness, tingling Psychiatric: No significant anxiety, depression, insomnia, anorexia Endocrine: No change in hair/skin/nails, excessive thirst, excessive hunger, excessive urination  Hematologic/lymphatic: No significant bruising, lymphadenopathy, abnormal bleeding Allergy/immunology: No itchy/watery eyes, significant sneezing, urticaria, angioedema  Physical exam:  Pertinent or positive findings: She was sitting in a wheelchair reading when I entered the room.  She is hard of hearing.  She has complete dentures.  Heart sounds are distant and the rate is slow.  She has minimal low-grade rhonchi in the right lower lobe on expiration.  Central obesity is present.  Thighs are enlarged.  Pedal pulses are decreased.  She has nonpitting edema of the ankles and feet.  She has isolated lateral deviation at the DIP joints of her hands.  There is very faint irregular small areas of erythema over the upper lateral back.  These areas do appear to blanch somewhat with pressure.  General appearance: Adequately nourished; no acute distress, increased work of breathing is present.   Lymphatic: No lymphadenopathy about the head, neck, axilla. Eyes: No conjunctival inflammation or lid edema is present. There is no scleral icterus. Ears:  External ear exam shows no significant lesions or deformities.   Nose:  External nasal examination shows no deformity or inflammation. Nasal mucosa are pink and moist without lesions, exudates  Oral exam:  Lips and gums are healthy appearing. There is no oropharyngeal  erythema or exudate. Neck:  No thyromegaly, masses, tenderness noted.    Heart:  No gallop, murmur, click, rub .  Lungs:  without wheezes, rales, rubs. Abdomen: Bowel sounds are normal. Abdomen is soft and nontender with no organomegaly, hernias, masses. GU: Deferred  Extremities:  No cyanosis, clubbing  Neurologic exam :Balance, Rhomberg, finger to nose testing could not be completed due to clinical state Skin: Warm & dry w/o tenting. No significant lesions   See summary under each active problem in the Problem List with associated updated therapeutic plan

## 2022-11-10 NOTE — Assessment & Plan Note (Signed)
Although she has central obesity; current albumin is 3.2 and total protein 6.1 compatible with protein/caloric malnutrition.  Nutritionist to continue to follow.  She states that she eats well.

## 2022-11-17 ENCOUNTER — Non-Acute Institutional Stay (SKILLED_NURSING_FACILITY): Payer: Medicare Other | Admitting: Adult Health

## 2022-11-17 ENCOUNTER — Encounter: Payer: Self-pay | Admitting: Adult Health

## 2022-11-17 DIAGNOSIS — F339 Major depressive disorder, recurrent, unspecified: Secondary | ICD-10-CM

## 2022-11-17 DIAGNOSIS — I708 Atherosclerosis of other arteries: Secondary | ICD-10-CM

## 2022-11-17 DIAGNOSIS — Z933 Colostomy status: Secondary | ICD-10-CM | POA: Diagnosis not present

## 2022-11-17 DIAGNOSIS — I700 Atherosclerosis of aorta: Secondary | ICD-10-CM

## 2022-11-17 NOTE — Progress Notes (Signed)
Location:  [place] Room Number: NO/144/P Place of Service:  SNF (31)   CODE STATUS: dnr   No Known Allergies  Chief Complaint  Patient presents with   Acute Visit    Care plan meeting.      HPI:  We have come together for her care plan meeting.  BIMS 13/15 mood 0/30. She is nonambulatory with no falls. She requires supervision for her adl care. She has a colostomy and is frequently incontinent of bladder. Dietary:  weight is 180 pounds  down 6 pounds in the past quarter. on regular diet appetite 75-100% feeds self.  Therapy: none at this time . Activities: activities in room . She continues to be followed for her chronic illnesses including: Aorta-iliac atherosclerosis Colostomy status Major depression recurrent  Past Medical History:  Diagnosis Date   Anxiety    Arthritis    Asthma    Cancer ([place])    breast   Depression    Diverticulosis    GERD (gastroesophageal reflux disease)    HTN (hypertension)    Shortness of breath    exertion   Sleep apnea    STOP BANG 4   Thyroid dysfunction     Past Surgical History:  Procedure Laterality Date   ABDOMINAL HYSTERECTOMY     arthroscopic surgery rt knee  1997   BREAST SURGERY  2001   LEFT/ CANCER    broken radius ulna  1990   left   broken right ankle  1976   EXPLORATORY LAPAROTOMY  08/24/2020   with partial sigmoidectomy creat of end colostomy and application of negative pressure dressing.      JOINT REPLACEMENT  right knee 2005 [facility]   left knee  2004   rt foot bone spur removed  [place]  01/22/2012   Procedure: TOTAL KNEE ARTHROPLASTY;  Surgeon: Carole Civil, MD;  Location: [facility];  Service: Orthopedics;  Laterality: Left;   TUBAL LIGATION      Social History   Socioeconomic History   Marital status: Widowed    Spouse name: Not on file   Number of children: Not on file   Years of education: Not on file   Highest education  level: Not on file  Occupational History   Not on file  Tobacco Use   Smoking status: Former   Smokeless tobacco: Former  Scientific laboratory technician Use: Never used  Substance and Sexual Activity   Alcohol use: No   Drug use: No   Sexual activity: Never  Other Topics Concern   Not on file  Social History Narrative   Not on file   Social Determinants of Health   Financial Resource Strain: Not on file  Food Insecurity: Not on file  Transportation Needs: Not on file  Physical Activity: Not on file  Stress: Not on file  Social Connections: Not on file  Intimate Partner Violence: Not on file   Family History  Problem Relation Age of Onset   Pseudochol deficiency Neg Hx    Malignant hyperthermia Neg Hx    Hypotension Neg Hx    Anesthesia problems Neg Hx       VITAL SIGNS BP 138/60   Pulse 63   Temp (!) 97.3 F (36.3 C)   Resp 20   Ht '4\' 9"'$  (1.448 m)   Wt 180 lb (81.6 kg)   SpO2 97%   BMI 38.95 kg/m  Outpatient Encounter Medications as of 11/17/2022  Medication Sig   acetaminophen (TYLENOL) 500 MG tablet Take 500 mg by mouth every 8 (eight) hours as needed.   acetaminophen (TYLENOL) 500 MG tablet Take 500 mg by mouth at bedtime.   atenolol (TENORMIN) 50 MG tablet Take 50 mg by mouth every evening.   BREO ELLIPTA 200-25 MCG/INH AEPB Inhale 1 puff into the lungs daily.   Calcium Carb-Cholecalciferol (CALCIUM 500 + D PO) Take 1 tablet by mouth daily.   Cholecalciferol (VITAMIN D3) 50 MCG (2000 UT) TABS Take 1 tablet by mouth daily.   Cyanocobalamin 1000 MCG/ML KIT Inject as directed. Once A Day on the 10th of the Month   furosemide (LASIX) 20 MG tablet Take 20 mg by mouth daily.   loratadine (CLARITIN) 10 MG tablet Take 10 mg by mouth daily.   melatonin 3 MG TABS tablet Take 3 mg by mouth at bedtime.   Multiple Vitamins-Minerals (PRESERVISION AREDS 2) CAPS Take 1 capsule by mouth in the morning and at bedtime.   NON FORMULARY Diet:Regular   nystatin (MYCOSTATIN/NYSTOP)  powder Apply 1 application topically daily as needed. Special Instructions: Apply thin layer of Nystatin powder to skin around stoma when changing colostomy bag PRN irritation.   pantoprazole (PROTONIX) 40 MG tablet Take 40 mg by mouth 2 (two) times daily.   potassium chloride SA (KLOR-CON) 20 MEQ tablet Take 40 mEq by mouth 2 (two) times daily.   No facility-administered encounter medications on file as of 11/17/2022.     SIGNIFICANT DIAGNOSTIC EXAMS  PREVIOUS   03-02-21: DEXA: t score -3.258  NO NEW LABS.   LABS REVIEWED PREVIOUS   04-10-22: wbc 6.1; hgb 12.5; hct 37.9; mcv 101.6 plt 196; glucose 86; bun 17; creat 0.67; k+ 3.3; na++ 139; ca 9.0; na++ 9.0; gfr >60; protein 6.1; albumin 3.2 vit D 45.08 05-15-22: glucose 87; bun 14; creat 0.63; k+ 3.0; na++ 140; ca 8.8; gf >60 05-22-22: k+ 3.6   06-08-22: glucose 87; bun 14; creat 0.63; k+ 3.0; na++ 8.8; gfr >60 08-10-22: tsh 1.815  NO NEW LABS.   Review of Systems  Constitutional:  Negative for malaise/fatigue.  Respiratory:  Negative for cough and shortness of breath.   Cardiovascular:  Negative for chest pain, palpitations and leg swelling.  Gastrointestinal:  Negative for abdominal pain, constipation and heartburn.  Musculoskeletal:  Negative for back pain, joint pain and myalgias.  Skin: Negative.   Neurological:  Negative for dizziness.  Psychiatric/Behavioral:  The patient is not nervous/anxious.     Physical Exam Constitutional:      General: She is not in acute distress.    Appearance: She is well-developed. She is not diaphoretic.  Neck:     Thyroid: No thyromegaly.  Cardiovascular:     Rate and Rhythm: Normal rate and regular rhythm.     Pulses: Normal pulses.     Heart sounds: Normal heart sounds.  Pulmonary:     Effort: Pulmonary effort is normal. No respiratory distress.     Breath sounds: Normal breath sounds.  Abdominal:     General: Bowel sounds are normal. There is no distension.     Palpations: Abdomen  is soft.     Tenderness: There is no abdominal tenderness.     Comments: Colostomy   Musculoskeletal:        General: Normal range of motion.     Cervical back: Neck supple.     Right lower leg: No edema.     Left  lower leg: No edema.  Lymphadenopathy:     Cervical: No cervical adenopathy.  Skin:    General: Skin is warm and dry.  Neurological:     Mental Status: She is alert. Mental status is at baseline.  Psychiatric:        Mood and Affect: Mood normal.       ASSESSMENT/ PLAN:  TODAY  Aorta-iliac atherosclerosis Colostomy status Major depression recurrent  Will continue current medications Will continue current plan of care Will continue to monitor her status.   Time spent with patient: 40 minutes: plan of care medications dietary    Ok Edwards NP [facility]   call [phone]

## 2022-11-23 ENCOUNTER — Encounter: Payer: Self-pay | Admitting: Radiology

## 2022-11-27 DIAGNOSIS — B351 Tinea unguium: Secondary | ICD-10-CM | POA: Diagnosis not present

## 2022-11-27 DIAGNOSIS — I739 Peripheral vascular disease, unspecified: Secondary | ICD-10-CM | POA: Diagnosis not present

## 2022-11-27 DIAGNOSIS — M2041 Other hammer toe(s) (acquired), right foot: Secondary | ICD-10-CM | POA: Diagnosis not present

## 2022-11-27 DIAGNOSIS — M2042 Other hammer toe(s) (acquired), left foot: Secondary | ICD-10-CM | POA: Diagnosis not present

## 2022-12-14 ENCOUNTER — Encounter: Payer: Self-pay | Admitting: Adult Health

## 2022-12-14 ENCOUNTER — Non-Acute Institutional Stay (SKILLED_NURSING_FACILITY): Payer: Medicare Other | Admitting: Adult Health

## 2022-12-14 DIAGNOSIS — K56609 Unspecified intestinal obstruction, unspecified as to partial versus complete obstruction: Secondary | ICD-10-CM | POA: Diagnosis not present

## 2022-12-14 DIAGNOSIS — I708 Atherosclerosis of other arteries: Secondary | ICD-10-CM

## 2022-12-14 DIAGNOSIS — I700 Atherosclerosis of aorta: Secondary | ICD-10-CM

## 2022-12-14 DIAGNOSIS — F339 Major depressive disorder, recurrent, unspecified: Secondary | ICD-10-CM

## 2022-12-14 DIAGNOSIS — Z933 Colostomy status: Secondary | ICD-10-CM

## 2022-12-14 NOTE — Progress Notes (Signed)
Location:  [facility] Room Number: 144 Place of Service:  SNF (31)   CODE STATUS: dnr   No Known Allergies  Chief Complaint  Patient presents with   Medical Management of Chronic Issues              Major depression recurrent chronic: Aortoiliac atherosclerosis  History of large bowel obstruction     HPI:  She is a 89 year old long term resident of this facility being seen for the management of her chronic illnesses: Major depression recurrent chronic: Aortoiliac atherosclerosis  History of large bowel obstruction. There are no reports of uncontrolled pain; weight is stable; appetite is stable.   Past Medical History:  Diagnosis Date   Anxiety    Arthritis    Asthma    Cancer ([place])    breast   Depression    Diverticulosis    GERD (gastroesophageal reflux disease)    HTN (hypertension)    Shortness of breath    exertion   Sleep apnea    STOP BANG 4   Thyroid dysfunction     Past Surgical History:  Procedure Laterality Date   ABDOMINAL HYSTERECTOMY     arthroscopic surgery rt knee  1997   BREAST SURGERY  2001   LEFT/ CANCER    broken radius ulna  1990   left   broken right ankle  1976   EXPLORATORY LAPAROTOMY  08/24/2020   with partial sigmoidectomy creat of end colostomy and application of negative pressure dressing.      JOINT REPLACEMENT  right knee 2005 [place]   left knee  2004   rt foot bone spur removed  [place]  01/22/2012   Procedure: TOTAL KNEE ARTHROPLASTY;  Surgeon: Carole Civil, MD;  Location: [facility];  Service: Orthopedics;  Laterality: Left;   TUBAL LIGATION      Social History   Socioeconomic History   Marital status: Widowed    Spouse name: Not on file   Number of children: Not on file   Years of education: Not on file   Highest education level: Not on file  Occupational History   Not on file  Tobacco Use   Smoking status: Former   Smokeless tobacco:  Former  Scientific laboratory technician Use: Never used  Substance and Sexual Activity   Alcohol use: No   Drug use: No   Sexual activity: Never  Other Topics Concern   Not on file  Social History Narrative   Not on file   Social Determinants of Health   Financial Resource Strain: Not on file  Food Insecurity: Not on file  Transportation Needs: Not on file  Physical Activity: Not on file  Stress: Not on file  Social Connections: Not on file  Intimate Partner Violence: Not on file   Family History  Problem Relation Age of Onset   Pseudochol deficiency Neg Hx    Malignant hyperthermia Neg Hx    Hypotension Neg Hx    Anesthesia problems Neg Hx       VITAL SIGNS BP 126/70   Pulse 65   Temp (!) 96.7 F (35.9 C)   Resp 16   Ht 4\' 9"  (1.448 m)   Wt 177 lb 3.2 oz (80.4 kg)   SpO2 99%   BMI 38.35 kg/m   Outpatient Encounter Medications as of 12/14/2022  Medication Sig   acetaminophen (TYLENOL) 500 MG  tablet Take 500 mg by mouth every 8 (eight) hours as needed.   acetaminophen (TYLENOL) 500 MG tablet Take 500 mg by mouth at bedtime.   atenolol (TENORMIN) 50 MG tablet Take 50 mg by mouth every evening.   BREO ELLIPTA 200-25 MCG/INH AEPB Inhale 1 puff into the lungs daily.   Calcium Carb-Cholecalciferol (CALCIUM 500 + D PO) Take 1 tablet by mouth daily.   Cholecalciferol (VITAMIN D3) 50 MCG (2000 UT) TABS Take 1 tablet by mouth daily.   Cyanocobalamin 1000 MCG/ML KIT Inject as directed. Once A Day on the 10th of the Month   furosemide (LASIX) 20 MG tablet Take 20 mg by mouth daily.   loratadine (CLARITIN) 10 MG tablet Take 10 mg by mouth daily.   melatonin 3 MG TABS tablet Take 3 mg by mouth at bedtime.   Multiple Vitamins-Minerals (PRESERVISION AREDS 2) CAPS Take 1 capsule by mouth in the morning and at bedtime.   NON FORMULARY Diet:Regular   nystatin (MYCOSTATIN/NYSTOP) powder Apply 1 application topically daily as needed. Special Instructions: Apply thin layer of Nystatin powder  to skin around stoma when changing colostomy bag PRN irritation.   pantoprazole (PROTONIX) 40 MG tablet Take 40 mg by mouth 2 (two) times daily.   potassium chloride SA (KLOR-CON) 20 MEQ tablet Take 40 mEq by mouth 2 (two) times daily.   No facility-administered encounter medications on file as of 12/14/2022.     SIGNIFICANT DIAGNOSTIC EXAMS  PREVIOUS   03-02-21: DEXA: t score -3.258  NO NEW LABS.   LABS REVIEWED PREVIOUS  04-10-22: wbc 6.1; hgb 12.5; hct 37.9; mcv 101.6 plt 196; glucose 86; bun 17; creat 0.67; k+ 3.3; na++ 139; ca 9.0; na++ 9.0; gfr >60; protein 6.1; albumin 3.2 vit D 45.08 05-15-22: glucose 87; bun 14; creat 0.63; k+ 3.0; na++ 140; ca 8.8; gf >60 05-22-22: k+ 3.6   06-08-22: glucose 87; bun 14; creat 0.63; k+ 3.0; na++ 8.8; gfr >60 08-10-22: tsh 1.815 10-23-22: wbc 5.3; hgb 12.3; hct 37.5; mcv 102.5 plt 189; glucose 82; bun 14; creat 0.71; k+ 4.0; na++ 140; ca 8.9; gfr >60; protein 6.1 albumin 3.2   NO NEW LABS.   Review of Systems  Constitutional:  Negative for malaise/fatigue.  Respiratory:  Negative for cough and shortness of breath.   Cardiovascular:  Negative for chest pain, palpitations and leg swelling.  Gastrointestinal:  Negative for abdominal pain, constipation and heartburn.  Musculoskeletal:  Negative for back pain, joint pain and myalgias.  Skin: Negative.   Neurological:  Negative for dizziness.  Psychiatric/Behavioral:  The patient is not nervous/anxious.    Physical Exam Constitutional:      General: She is not in acute distress.    Appearance: She is well-developed. She is not diaphoretic.  Neck:     Thyroid: No thyromegaly.  Cardiovascular:     Rate and Rhythm: Normal rate and regular rhythm.     Heart sounds: Normal heart sounds.  Pulmonary:     Effort: Pulmonary effort is normal. No respiratory distress.     Breath sounds: Normal breath sounds.  Abdominal:     General: Bowel sounds are normal. There is no distension.     Palpations:  Abdomen is soft.     Tenderness: There is no abdominal tenderness.     Comments: Colostomy  Musculoskeletal:        General: Normal range of motion.     Cervical back: Neck supple.     Right lower leg: No edema.  Left lower leg: No edema.  Lymphadenopathy:     Cervical: No cervical adenopathy.  Skin:    General: Skin is warm and dry.  Neurological:     Mental Status: She is alert. Mental status is at baseline.  Psychiatric:        Mood and Affect: Mood normal.     ASSESSMENT/ PLAN:  TODAY  Major depression recurrent chronic: will continue melatonin nightly for sleep   2. Aortoiliac atherosclerosis (ct 08-21-20) is no on statin due to advanced age  25. History of large bowel obstruction is status post colostomy    PREVIOUS   4. Degenerative disc disease lumbar: will continue tylenol 500 mg nightly   5. Moderate persistent asthma in adult: will continue breo ellipta 200/25 mcg 1 puff daily   6. Macrocytic anemia: hgb 12.5  7. Post menopausal osteoporosis t score -3.253 will continue fosamax 70 mg weekly   8 . Vitamin B 12 deficiency: level 446 will continue monthly injections.   9. Bilateral lower extremity edema: is on lasix 20 mg daily with k+ 40 meq twice daily   10. Non-seasonal allergic rhinitis unspecified trigger: will continue claritin 10 mg daily   11. Essential hypertension b/p 118/68 will continue tenormin 50 mg daily   12. Hypokalemia k+ is 3.0 will continue k+ 40 meq twice daily    Ok Edwards NP [facility]   call [phone]

## 2023-01-07 ENCOUNTER — Encounter (HOSPITAL_COMMUNITY)
Admission: RE | Admit: 2023-01-07 | Discharge: 2023-01-07 | Disposition: A | Discharge status: Home / Self Care | Payer: Medicare Other | Source: Skilled Nursing Facility | Attending: Adult Health | Admitting: Adult Health

## 2023-01-07 DIAGNOSIS — E538 Deficiency of other specified B group vitamins: Secondary | ICD-10-CM | POA: Diagnosis not present

## 2023-01-07 LAB — VITAMIN B12: Vitamin B-12: 328 pg/mL (ref 180–914)

## 2023-01-15 ENCOUNTER — Non-Acute Institutional Stay (SKILLED_NURSING_FACILITY): Payer: Medicare Other | Admitting: Adult Health

## 2023-01-15 ENCOUNTER — Encounter: Payer: Self-pay | Admitting: Adult Health

## 2023-01-15 DIAGNOSIS — D539 Nutritional anemia, unspecified: Secondary | ICD-10-CM

## 2023-01-15 DIAGNOSIS — M47816 Spondylosis without myelopathy or radiculopathy, lumbar region: Secondary | ICD-10-CM

## 2023-01-15 DIAGNOSIS — Z933 Colostomy status: Secondary | ICD-10-CM | POA: Diagnosis not present

## 2023-01-15 DIAGNOSIS — J4540 Moderate persistent asthma, uncomplicated: Secondary | ICD-10-CM | POA: Diagnosis not present

## 2023-01-15 NOTE — Progress Notes (Unsigned)
Location:  [facility] Room Number: 144 Place of Service:  SNF (31)   CODE STATUS: DNR  No Known Allergies  Chief Complaint  Patient presents with   Medical Management of Chronic Issues    Routine Visit    HPI:    Past Medical History:  Diagnosis Date   Anxiety    Arthritis    Asthma    Cancer    breast   Depression    Diverticulosis    GERD (gastroesophageal reflux disease)    HTN (hypertension)    Shortness of breath    exertion   Sleep apnea    STOP BANG 4   Thyroid dysfunction     Past Surgical History:  Procedure Laterality Date   ABDOMINAL HYSTERECTOMY     arthroscopic surgery rt knee  1997   BREAST SURGERY  2001   LEFT/ CANCER    broken radius ulna  1990   left   broken right ankle  1976   EXPLORATORY LAPAROTOMY  08/24/2020   with partial sigmoidectomy creat of end colostomy and application of negative pressure dressing.      JOINT REPLACEMENT  right knee 2005 [place]   left knee  2004   rt foot bone spur removed  1992   THYROID SURGERY  1983   TOTAL KNEE ARTHROPLASTY  01/22/2012   Procedure: TOTAL KNEE ARTHROPLASTY;  Surgeon: Vickki Hearing, MD;  Location: [facility];  Service: Orthopedics;  Laterality: Left;   TUBAL LIGATION      Social History   Socioeconomic History   Marital status: Widowed    Spouse name: Not on file   Number of children: Not on file   Years of education: Not on file   Highest education level: Not on file  Occupational History   Not on file  Tobacco Use   Smoking status: Former   Smokeless tobacco: Former  Building services engineer Use: Never used  Substance and Sexual Activity   Alcohol use: No   Drug use: No   Sexual activity: Never  Other Topics Concern   Not on file  Social History Narrative   Not on file   Social Determinants of Health   Financial Resource Strain: Not on file  Food Insecurity: Not on file  Transportation Needs: Not on file  Physical Activity: Not on file  Stress:  Not on file  Social Connections: Not on file  Intimate Partner Violence: Not on file   Family History  Problem Relation Age of Onset   Pseudochol deficiency Neg Hx    Malignant hyperthermia Neg Hx    Hypotension Neg Hx    Anesthesia problems Neg Hx       VITAL SIGNS BP 112/60   Pulse 72   Temp (!) 97 F (36.1 C)   Resp 20   Ht  (1.448 m)   Wt 178 lb 3.2 oz (80.8 kg)   SpO2 94%   BMI 38.56 kg/m   Outpatient Encounter Medications as of 01/15/2023  Medication Sig   acetaminophen (TYLENOL) 500 MG tablet Take 500 mg by mouth every 8 (eight) hours as needed.   acetaminophen (TYLENOL) 500 MG tablet Take 500 mg by mouth at bedtime.   atenolol (TENORMIN) 50 MG tablet Take 50 mg by mouth every evening.   BREO ELLIPTA 200-25 MCG/INH AEPB Inhale 1 puff into the lungs daily.   Calcium Carb-Cholecalciferol (CALCIUM 500 + D PO) Take 1 tablet by mouth daily.  Cholecalciferol (VITAMIN D3) 50 MCG (2000 UT) TABS Take 1 tablet by mouth daily.   Cyanocobalamin 1000 MCG/ML KIT Inject as directed. Once A Day on the 10th of the Month   furosemide (LASIX) 20 MG tablet Take 20 mg by mouth daily.   loratadine (CLARITIN) 10 MG tablet Take 10 mg by mouth daily.   melatonin 3 MG TABS tablet Take 3 mg by mouth at bedtime.   Multiple Vitamins-Minerals (PRESERVISION AREDS 2) CAPS Take 1 capsule by mouth in the morning and at bedtime.   NON FORMULARY Diet:Regular   nystatin (MYCOSTATIN/NYSTOP) powder Apply 1 application topically daily as needed. Special Instructions: Apply thin layer of Nystatin powder to skin around stoma when changing colostomy bag PRN irritation.   pantoprazole (PROTONIX) 40 MG tablet Take 40 mg by mouth 2 (two) times daily.   potassium chloride SA (KLOR-CON) 20 MEQ tablet Take 40 mEq by mouth 2 (two) times daily.   No facility-administered encounter medications on file as of 01/15/2023.     SIGNIFICANT DIAGNOSTIC EXAMS       ASSESSMENT/ PLAN:     Synthia Innocent  NP [facility]  Contact [phone] Monday through Friday 8am- 5pm  After hours call [phone]

## 2023-01-17 ENCOUNTER — Encounter: Payer: Self-pay | Admitting: Adult Health

## 2023-01-17 ENCOUNTER — Non-Acute Institutional Stay (INDEPENDENT_AMBULATORY_CARE_PROVIDER_SITE_OTHER): Payer: Medicare Other | Admitting: Adult Health

## 2023-01-17 DIAGNOSIS — Z0000 Encounter for general adult medical examination without abnormal findings: Secondary | ICD-10-CM

## 2023-01-17 NOTE — Progress Notes (Signed)
Subjective:   [name] is a 89 y.o. female who presents for Medicare Annual (Subsequent) preventive examination.  Review of Systems    Review of Systems  Constitutional:  Negative for malaise/fatigue.  Respiratory:  Negative for cough and shortness of breath.   Cardiovascular:  Negative for chest pain, palpitations and leg swelling.  Gastrointestinal:  Negative for abdominal pain, constipation and heartburn.  Musculoskeletal:  Negative for back pain, joint pain and myalgias.  Skin: Negative.   Neurological:  Negative for dizziness.  Psychiatric/Behavioral:  The patient is not nervous/anxious.     Cardiac Risk Factors include: advanced age (>4men, >14 women);sedentary lifestyle;obesity (BMI >30kg/m2)     Objective:    Today's Vitals   01/17/23 1029  BP: 112/60  Pulse: 72  Resp: 20  Temp: (!) 97 F (36.1 C)  SpO2: 94%  Weight: 178 lb 3.2 oz (80.8 kg)  Height: 4\' 9"  (1.448 m)   Body mass index is 38.56 kg/m.     01/15/2023    3:48 PM 11/17/2022   11:14 AM 10/12/2022    1:37 PM 08/25/2022    9:59 AM 07/06/2022   12:19 PM 05/26/2022    9:24 AM 04/06/2022   11:24 AM  Advanced Directives  Does Patient Have a Medical Advance Directive? Yes Yes Yes Yes Yes Yes Yes  Type of Advance Directive Out of facility DNR (pink MOST or yellow form) Out of facility DNR (pink MOST or yellow form) Out of facility DNR (pink MOST or yellow form) Out of facility DNR (pink MOST or yellow form) Out of facility DNR (pink MOST or yellow form) Out of facility DNR (pink MOST or yellow form) Out of facility DNR (pink MOST or yellow form)  Does patient want to make changes to medical advance directive? No - Patient declined No - Patient declined No - Patient declined No - Patient declined No - Patient declined No - Patient declined No - Patient declined  Pre-existing out of facility DNR order (yellow form or pink MOST form) Yellow form placed in chart (order not valid for inpatient use) Yellow  form placed in chart (order not valid for inpatient use) Yellow form placed in chart (order not valid for inpatient use) Yellow form placed in chart (order not valid for inpatient use) Yellow form placed in chart (order not valid for inpatient use) Yellow form placed in chart (order not valid for inpatient use) Yellow form placed in chart (order not valid for inpatient use)    Current Medications (verified) Outpatient Encounter Medications as of 01/17/2023  Medication Sig   acetaminophen (TYLENOL) 500 MG tablet Take 500 mg by mouth every 8 (eight) hours as needed.   acetaminophen (TYLENOL) 500 MG tablet Take 500 mg by mouth at bedtime.   atenolol (TENORMIN) 50 MG tablet Take 50 mg by mouth every evening.   BREO ELLIPTA 200-25 MCG/INH AEPB Inhale 1 puff into the lungs daily.   Calcium Carb-Cholecalciferol (CALCIUM 500 + D PO) Take 1 tablet by mouth daily.   Cholecalciferol (VITAMIN D3) 50 MCG (2000 UT) TABS Take 1 tablet by mouth daily.   Cyanocobalamin 1000 MCG/ML KIT Inject as directed. Once A Day on the 10th of the Month   furosemide (LASIX) 20 MG tablet Take 20 mg by mouth daily.   loratadine (CLARITIN) 10 MG tablet Take 10 mg by mouth daily.   melatonin 3 MG TABS tablet Take 3 mg by mouth at bedtime.   Multiple Vitamins-Minerals (PRESERVISION AREDS 2) CAPS Take  1 capsule by mouth in the morning and at bedtime.   NON FORMULARY Diet:Regular   nystatin (MYCOSTATIN/NYSTOP) powder Apply 1 application topically daily as needed. Special Instructions: Apply thin layer of Nystatin powder to skin around stoma when changing colostomy bag PRN irritation.   pantoprazole (PROTONIX) 40 MG tablet Take 40 mg by mouth 2 (two) times daily.   potassium chloride SA (KLOR-CON) 20 MEQ tablet Take 40 mEq by mouth 2 (two) times daily.   No facility-administered encounter medications on file as of 01/17/2023.    Allergies (verified) Patient has no known allergies.   History: Past Medical History:  Diagnosis  Date   Anxiety    Arthritis    Asthma    Cancer    breast   Depression    Diverticulosis    GERD (gastroesophageal reflux disease)    HTN (hypertension)    Shortness of breath    exertion   Sleep apnea    STOP BANG 4   Thyroid dysfunction    Past Surgical History:  Procedure Laterality Date   ABDOMINAL HYSTERECTOMY     arthroscopic surgery rt knee  1997   BREAST SURGERY  2001   LEFT/ CANCER    broken radius ulna  1990   left   broken right ankle  1976   EXPLORATORY LAPAROTOMY  08/24/2020   with partial sigmoidectomy creat of end colostomy and application of negative pressure dressing.      JOINT REPLACEMENT  right knee 2005 [facility]   left knee  2004   rt foot bone spur removed  1992   THYROID SURGERY  1983   TOTAL KNEE ARTHROPLASTY  01/22/2012   Procedure: TOTAL KNEE ARTHROPLASTY;  Surgeon: Vickki Hearing, MD;  Location: [facility];  Service: Orthopedics;  Laterality: Left;   TUBAL LIGATION     Family History  Problem Relation Age of Onset   Pseudochol deficiency Neg Hx    Malignant hyperthermia Neg Hx    Hypotension Neg Hx    Anesthesia problems Neg Hx    Social History   Socioeconomic History   Marital status: Widowed    Spouse name: Not on file   Number of children: Not on file   Years of education: Not on file   Highest education level: Not on file  Occupational History   Not on file  Tobacco Use   Smoking status: Former   Smokeless tobacco: Former  Building services engineer Use: Never used  Substance and Sexual Activity   Alcohol use: No   Drug use: No   Sexual activity: Never  Other Topics Concern   Not on file  Social History Narrative   Not on file   Social Determinants of Health   Financial Resource Strain: Not on file  Food Insecurity: Not on file  Transportation Needs: Not on file  Physical Activity: Not on file  Stress: Not on file  Social Connections: Not on file    Tobacco Counseling Counseling given: Not Answered   Clinical  Intake:  Pre-visit preparation completed: Yes  Pain : No/denies pain     BMI - recorded: 32.56 Nutritional Status: BMI > 30  Obese Nutritional Risks: Unintentional weight gain Diabetes: No  How often do you need to have someone help you when you read instructions, pamphlets, or other written materials from your doctor or pharmacy?: 5 - Always  Diabetic?no  Interpreter Needed?: No  Comments: [facility] resident of [facility]   Activities of Daily Living  01/17/2023   10:32 AM  In your present state of health, do you have any difficulty performing the following activities:  Hearing? 0  Vision? 0  Difficulty concentrating or making decisions? 1  Walking or climbing stairs? 1  Dressing or bathing? 1  Doing errands, shopping? 1  Preparing Food and eating ? Y  Using the Toilet? Y  In the past six months, have you accidently leaked urine? Y  Do you have problems with loss of bowel control? Y  Comment has colostomy  Managing your Medications? Y  Managing your Finances? Y  Housekeeping or managing your Housekeeping? Y    Patient Care Team: Sharee Holster, NP as PCP - General (Geriatric Medicine)  Indicate any recent Medical Services you may have received from other than [facility] providers in the past year (date may be approximate).     Assessment:   This is a routine wellness examination for Prosser.  Hearing/Vision screen No results found.  Dietary issues and exercise activities discussed: Current Exercise Habits: The patient does not participate in regular exercise at present, Exercise limited by: None identified   Goals Addressed             This Visit's Progress    Absence of Fall and Fall-Related Injury   On track    Evidence-based guidance:  Assess fall risk using a validated tool when available. Consider balance and gait impairment, muscle weakness, diminished vision or hearing, environmental hazards, presence of urinary or bowel urgency and/or incontinence.   Communicate fall injury risk to interprofessional healthcare team.  Develop a fall prevention plan with the patient and family.  Promote use of personal vision and auditory aids.  Promote reorientation, appropriate sensory stimulation, and routines to decrease risk of fall when changes in mental status are present.  Assess assistance level required for safe and effective self-care; consider referral for home care.  Encourage physical activity, such as performance of self-care at highest level of ability, strength and balance exercise program, and provision of appropriate assistive devices; refer to rehabilitation therapy.  Refer to community-based fall prevention program where available.  If fall occurs, determine the cause and revise fall injury prevention plan.  Regularly review medication contribution to fall risk; consider risk related to polypharmacy and age.  Refer to pharmacist for consultation when concerns about medications are revealed.  Balance adequate pain management with potential for oversedation.  Provide guidance related to environmental modifications.  Consider supplementation with Vitamin D.   Notes:      Follow up with Provider as scheduled   On track    General - Client will not be readmitted within 30 days ([facility])   On track      Depression Screen    01/17/2023   10:32 AM 03/02/2022   12:59 PM 01/16/2022   11:06 AM 10/13/2021    1:55 PM 09/22/2021   12:19 PM 01/11/2021   11:18 AM  PHQ 2/9 Scores  PHQ - 2 Score 0 0 0 0 0 0  PHQ- 9 Score  0        Fall Risk    01/17/2023   10:32 AM 11/17/2022   11:14 AM 01/16/2022   11:05 AM 10/13/2021    1:55 PM 09/22/2021   12:16 PM  Fall Risk   Falls in the past year? 0 0 Number falls in past yr: 0 0 0 0 0  Injury with Fall? 0 0 0 0 0  Risk for fall due  to : History of fall(s);Impaired mobility No Fall Risks History of fall(s);Impaired balance/gait;Impaired mobility Impaired balance/gait;Impaired mobility History of  fall(s);Impaired balance/gait  Follow up Falls evaluation completed Falls evaluation completed       FALL RISK PREVENTION PERTAINING TO THE HOME:  Any stairs in or around the home? Yes  If so, are there any without handrails? No  Home free of loose throw rugs in walkways, pet beds, electrical cords, etc? Yes  Adequate lighting in your home to reduce risk of falls? Yes   ASSISTIVE DEVICES UTILIZED TO PREVENT FALLS:  Life alert? No  Use of a cane, walker or w/c? Yes  Grab bars in the bathroom? Yes  Shower chair or bench in shower? Yes  Elevated toilet seat or a handicapped toilet? Yes   TIMED UP AND GO:  Was the test performed? No .  Nonambulatory   Cognitive Function:    01/17/2023   10:33 AM 01/16/2022   11:06 AM 01/11/2021   11:19 AM  MMSE - Mini Mental State Exam  Not completed:  Unable to complete Unable to complete  Orientation to time 3    Orientation to Place 4    Registration 2    Attention/ Calculation 4    Recall 2    Language- name 2 objects 2    Language- repeat 1    Language- follow 3 step command 3    Language- read & follow direction 1    Write a sentence 0    Copy design 0    Total score 22          01/17/2023   10:35 AM  6CIT Screen  What Year? 0 points  What month? 0 points  What time? 3 points  Count back from 20 4 points  Months in reverse 4 points  Repeat phrase 4 points  Total Score 15 points    Immunizations Immunization History  Administered Date(s) Administered   Fluad Quad(high Dose 65+) 06/29/2021   Influenza-Unspecified 06/23/2020   Moderna Covid-19 Vaccine Bivalent Booster 60yrs & up 07/19/2021   Moderna SARS-COV2 Booster Vaccination 09/23/2020, 11/01/2020, 02/14/2022, 07/19/2022   Moderna Sars-Covid-2 Vaccination 01/15/2020, 02/12/2020   PNEUMOCOCCAL CONJUGATE-20 03/10/2022   Pneumococcal Conjugate-13 02/15/2011   Pneumococcal Polysaccharide-23 05/23/2018   Rsv, Bivalent, Protein Subunit Rsvpref,pf Verdis Frederickson) 08/28/2022    Td 09/01/2020   Tdap 02/15/2011, 07/27/2021   Zoster Recombinat (Shingrix) 05/23/2018, 06/23/2020    TDAP status: Up to date  Flu Vaccine status: Up to date  Pneumococcal vaccine status: Up to date  Covid-19 vaccine status: Completed vaccines  Qualifies for Shingles Vaccine? Yes   Zostavax completed Yes   Shingrix Completed?: No.    Education has been provided regarding the importance of this vaccine. Patient has been advised to call insurance company to determine out of pocket expense if they have not yet received this vaccine. Advised may also receive vaccine at local pharmacy or Health Dept. Verbalized acceptance and understanding.  Screening Tests Health Maintenance  Topic Date Due   COVID-19 Vaccine (8 - 2023-24 season) 09/13/2022   Medicare Annual Wellness (AWV)  01/31/2023   DTaP/Tdap/Td (4 - Td or Tdap) 07/28/2031   Pneumonia Vaccine 66+ Years old  Completed   DEXA SCAN  Completed   Zoster Vaccines- Shingrix  Completed   HPV VACCINES  Aged Out   INFLUENZA VACCINE  Discontinued    Health Maintenance  Health Maintenance Due  Topic Date Due   COVID-19 Vaccine (8 - 2023-24 season) 09/13/2022  Medicare Annual Wellness (AWV)  01/31/2023    Colorectal cancer screening: No longer required.   Mammogram status: No longer required due to age.  Bone Density status: Completed 07/2021. Results reflect: Bone density results: OSTEOPENIA. Repeat every 2 years.  Lung Cancer Screening: (Low Dose CT Chest recommended if Age 64-80 years, 30 pack-year currently smoking OR have quit w/in 15years.) does not qualify.   Lung Cancer Screening Referral: n/a  Additional Screening:  Hepatitis C Screening: does not qualify; Completed   Vision Screening: Recommended annual ophthalmology exams for early detection of glaucoma and other disorders of the eye. Is the patient up to date with their annual eye exam?  No  Who is the provider or what is the name of the office in which the  patient attends annual eye exams?  If pt is not established with a provider, would they like to be referred to a provider to establish care? No .   Dental Screening: Recommended annual dental exams for proper oral hygiene  Community Resource Referral / Chronic Care Management: CRR required this visit?  No   CCM required this visit?  No      Plan:     I have personally reviewed and noted the following in the patient's chart:   Medical and social history Use of alcohol, tobacco or illicit drugs  Current medications and supplements including opioid prescriptions. Patient is not currently taking opioid prescriptions. Functional ability and status Nutritional status Physical activity Advanced directives List of other physicians Hospitalizations, surgeries, and ER visits in previous 12 months Vitals Screenings to include cognitive, depression, and falls Referrals and appointments  In addition, I have reviewed and discussed with patient certain preventive protocols, quality metrics, and best practice recommendations. A written personalized care plan for preventive services as well as general preventive health recommendations were provided to patient.     Sharee Holster, NP   01/17/2023   Nurse Notes: this exam has been performed at this facility by myself

## 2023-01-17 NOTE — Patient Instructions (Signed)
  [name] , Thank you for taking time to come for your Medicare Wellness Visit. I appreciate your ongoing commitment to your health goals. Please review the following plan we discussed and let me know if I can assist you in the future.   These are the goals we discussed:  Goals      Absence of Fall and Fall-Related Injury     Evidence-based guidance:  Assess fall risk using a validated tool when available. Consider balance and gait impairment, muscle weakness, diminished vision or hearing, environmental hazards, presence of urinary or bowel urgency and/or incontinence.  Communicate fall injury risk to interprofessional healthcare team.  Develop a fall prevention plan with the patient and family.  Promote use of personal vision and auditory aids.  Promote reorientation, appropriate sensory stimulation, and routines to decrease risk of fall when changes in mental status are present.  Assess assistance level required for safe and effective self-care; consider referral for home care.  Encourage physical activity, such as performance of self-care at highest level of ability, strength and balance exercise program, and provision of appropriate assistive devices; refer to rehabilitation therapy.  Refer to community-based fall prevention program where available.  If fall occurs, determine the cause and revise fall injury prevention plan.  Regularly review medication contribution to fall risk; consider risk related to polypharmacy and age.  Refer to pharmacist for consultation when concerns about medications are revealed.  Balance adequate pain management with potential for oversedation.  Provide guidance related to environmental modifications.  Consider supplementation with Vitamin D.   Notes:      Follow up with Provider as scheduled     General - Client will not be readmitted within 30 days ([facility])        This is a list of the screening recommended for you and due dates:  Health  Maintenance  Topic Date Due   COVID-19 Vaccine (8 - 2023-24 season) 09/13/2022   Medicare Annual Wellness Visit  01/31/2023   DTaP/Tdap/Td vaccine (4 - Td or Tdap) 07/28/2031   Pneumonia Vaccine  Completed   DEXA scan (bone density measurement)  Completed   Zoster (Shingles) Vaccine  Completed   HPV Vaccine  Aged Out   Flu Shot  Discontinued

## 2023-01-29 ENCOUNTER — Other Ambulatory Visit (HOSPITAL_COMMUNITY)
Admission: RE | Admit: 2023-01-29 | Discharge: 2023-01-29 | Disposition: A | Discharge status: Home / Self Care | Payer: Medicare Other | Source: Skilled Nursing Facility | Attending: Adult Health | Admitting: Adult Health

## 2023-01-29 DIAGNOSIS — I10 Essential (primary) hypertension: Secondary | ICD-10-CM | POA: Insufficient documentation

## 2023-01-29 LAB — COMPREHENSIVE METABOLIC PANEL
ALT: 10 U/L (ref 0–44)
AST: 14 U/L — ABNORMAL LOW (ref 15–41)
Albumin: 3.1 g/dL — ABNORMAL LOW (ref 3.5–5.0)
Alkaline Phosphatase: 47 U/L (ref 38–126)
Anion gap: 9 (ref 5–15)
BUN: 15 mg/dL (ref 8–23)
CO2: 25 mmol/L (ref 22–32)
Calcium: 8.6 mg/dL — ABNORMAL LOW (ref 8.9–10.3)
Chloride: 106 mmol/L (ref 98–111)
Creatinine, Ser: 0.73 mg/dL (ref 0.44–1.00)
GFR, Estimated: >60 mL/min (ref >60)
Glucose, Bld: 76 mg/dL (ref 70–99)
Potassium: 3.7 mmol/L (ref 3.5–5.1)
Sodium: 140 mmol/L (ref 135–145)
Total Bilirubin: 0.8 mg/dL (ref 0.3–1.2)
Total Protein: 6 g/dL — ABNORMAL LOW (ref 6.5–8.1)

## 2023-01-29 LAB — TSH: TSH: 1.955 u[IU]/mL (ref 0.350–4.500)

## 2023-01-29 LAB — CBC
HCT: 37.5 % (ref 36.0–46.0)
Hemoglobin: 12.2 g/dL (ref 12.0–15.0)
MCH: 33.4 pg (ref 26.0–34.0)
MCHC: 32.5 g/dL (ref 30.0–36.0)
MCV: 102.7 fL — ABNORMAL HIGH (ref 80.0–100.0)
Platelets: 207 10*3/uL (ref 150–400)
RBC: 3.65 MIL/uL — ABNORMAL LOW (ref 3.87–5.11)
RDW: 11.9 % (ref 11.5–15.5)
WBC: 5.4 10*3/uL (ref 4.0–10.5)
nRBC: 0 % (ref 0.0–0.2)

## 2023-02-01 DIAGNOSIS — R488 Other symbolic dysfunctions: Secondary | ICD-10-CM | POA: Diagnosis not present

## 2023-02-01 DIAGNOSIS — F0150 Vascular dementia without behavioral disturbance: Secondary | ICD-10-CM | POA: Diagnosis not present

## 2023-02-01 DIAGNOSIS — Z433 Encounter for attention to colostomy: Secondary | ICD-10-CM | POA: Diagnosis not present

## 2023-02-02 ENCOUNTER — Encounter: Payer: Self-pay | Admitting: Adult Health

## 2023-02-02 DIAGNOSIS — Z433 Encounter for attention to colostomy: Secondary | ICD-10-CM | POA: Diagnosis not present

## 2023-02-02 DIAGNOSIS — R488 Other symbolic dysfunctions: Secondary | ICD-10-CM | POA: Diagnosis not present

## 2023-02-02 DIAGNOSIS — F0150 Vascular dementia without behavioral disturbance: Secondary | ICD-10-CM | POA: Diagnosis not present

## 2023-02-05 DIAGNOSIS — Z433 Encounter for attention to colostomy: Secondary | ICD-10-CM | POA: Diagnosis not present

## 2023-02-05 DIAGNOSIS — F0150 Vascular dementia without behavioral disturbance: Secondary | ICD-10-CM | POA: Diagnosis not present

## 2023-02-05 DIAGNOSIS — R488 Other symbolic dysfunctions: Secondary | ICD-10-CM | POA: Diagnosis not present

## 2023-02-06 DIAGNOSIS — R488 Other symbolic dysfunctions: Secondary | ICD-10-CM | POA: Diagnosis not present

## 2023-02-06 DIAGNOSIS — F0150 Vascular dementia without behavioral disturbance: Secondary | ICD-10-CM | POA: Diagnosis not present

## 2023-02-06 DIAGNOSIS — Z433 Encounter for attention to colostomy: Secondary | ICD-10-CM | POA: Diagnosis not present

## 2023-02-07 DIAGNOSIS — R488 Other symbolic dysfunctions: Secondary | ICD-10-CM | POA: Diagnosis not present

## 2023-02-07 DIAGNOSIS — F0150 Vascular dementia without behavioral disturbance: Secondary | ICD-10-CM | POA: Diagnosis not present

## 2023-02-07 DIAGNOSIS — Z433 Encounter for attention to colostomy: Secondary | ICD-10-CM | POA: Diagnosis not present

## 2023-02-08 DIAGNOSIS — R488 Other symbolic dysfunctions: Secondary | ICD-10-CM | POA: Diagnosis not present

## 2023-02-08 DIAGNOSIS — Z433 Encounter for attention to colostomy: Secondary | ICD-10-CM | POA: Diagnosis not present

## 2023-02-08 DIAGNOSIS — F0150 Vascular dementia without behavioral disturbance: Secondary | ICD-10-CM | POA: Diagnosis not present

## 2023-02-09 ENCOUNTER — Non-Acute Institutional Stay (SKILLED_NURSING_FACILITY): Payer: Medicare Other | Admitting: Internal Medicine

## 2023-02-09 ENCOUNTER — Encounter: Payer: Self-pay | Admitting: Internal Medicine

## 2023-02-09 DIAGNOSIS — I10 Essential (primary) hypertension: Secondary | ICD-10-CM

## 2023-02-09 DIAGNOSIS — E538 Deficiency of other specified B group vitamins: Secondary | ICD-10-CM | POA: Diagnosis not present

## 2023-02-09 DIAGNOSIS — E440 Moderate protein-calorie malnutrition: Secondary | ICD-10-CM | POA: Diagnosis not present

## 2023-02-09 DIAGNOSIS — F0150 Vascular dementia without behavioral disturbance: Secondary | ICD-10-CM

## 2023-02-09 DIAGNOSIS — Z433 Encounter for attention to colostomy: Secondary | ICD-10-CM | POA: Diagnosis not present

## 2023-02-09 DIAGNOSIS — R488 Other symbolic dysfunctions: Secondary | ICD-10-CM | POA: Diagnosis not present

## 2023-02-09 DIAGNOSIS — K219 Gastro-esophageal reflux disease without esophagitis: Secondary | ICD-10-CM | POA: Diagnosis not present

## 2023-02-09 NOTE — Assessment & Plan Note (Signed)
Current B12 level is therapeutic at 328.  No change indicated.

## 2023-02-09 NOTE — Assessment & Plan Note (Addendum)
Today she quickly gave me the correct date.  When I entered the room she was adeptly completing the upper border of a complex 500 piece puzzle upside down.

## 2023-02-09 NOTE — Assessment & Plan Note (Signed)
Despite central obesity; albumin is 3.1, down from 3.2 and total protein 6.0 down from 6.1.  Nutritionist continues to follow at the SNF.

## 2023-02-09 NOTE — Assessment & Plan Note (Addendum)
BP controlled; blood pressure is actually soft serially.  No change in antihypertensive medications; but because of persistent peripheral edema; low-dose furosemide dose could be increased if blood pressure remains stable.

## 2023-02-09 NOTE — Progress Notes (Signed)
NURSING HOME LOCATION:  [facility] ROOM NUMBER: [id number] P  CODE STATUS: DNR  PCP:  Synthia Innocent NP  This is a nursing facility follow up visit of chronic medical diagnoses & to document compliance with Regulation 483.30 (c) in The Long Term Care Survey Manual Phase 2 which mandates caregiver visit ( visits can alternate among physician, PA or NP as per statutes) within 10 days of 30 days / 60 days/ 90 days post admission to SNF date    Interim medical record and care since last SNF visit was updated with review of diagnostic studies and change in clinical status since last visit were documented.  HPI: She is a permanent resident of this facility with medical diagnoses of degenerative joint disease, history of asthma,history of diverticulosis, GERD, essential hypertension, OSA, and thyroid dysfunction.  Labs were performed 01/29/2023. The calcium had decreased from 8.9-8.6; albumin from 3.2 down to 3.1 and total protein from 6.1-6.0.  There was no anemia but MCV was 102.7.  B12 level was normal at 328.  TSH was therapeutic at 1.955.  Review of systems: Profound auditory acuity compromise hindered completion of review of systems; but she denies any active medical symptoms.  She states "I am doing all right for a 89 year old woman."  She states that she does not like wearing her hearing aids.  She has complete dentures.  She feels that the dentures are not functioning as well due to erosion and desires dental consultation.    Constitutional: No fever, significant weight change, fatigue  Eyes: No redness, discharge, pain, vision change ENT/mouth: No nasal congestion,  purulent discharge, earache, change in hearing, sore throat  Cardiovascular: No chest pain, palpitations, paroxysmal nocturnal dyspnea, edema  Respiratory: No cough, sputum production, hemoptysis, DOE, significant snoring, apnea   Gastrointestinal: No heartburn, dysphagia, abdominal pain, nausea /vomiting, rectal  bleeding, melena, change in bowels Genitourinary: No dysuria, hematuria, pyuria, incontinence, nocturia Musculoskeletal: No joint stiffness, joint swelling, weakness, pain Dermatologic: No rash, pruritus, change in appearance of skin Neurologic: No dizziness, headache, syncope, seizures, numbness, tingling Psychiatric: No significant anxiety, depression, insomnia, anorexia Endocrine: No change in hair/skin/nails, excessive thirst, excessive hunger, excessive urination  Hematologic/lymphatic: No significant bruising, lymphadenopathy, abnormal bleeding Allergy/immunology: No itchy/watery eyes, significant sneezing, urticaria, angioedema  Physical exam:  Pertinent or positive findings: At the time of exam she was sitting at a bedside table completing a 500 piece puzzle.  She quickly gave me the correct date.  As noted she is profoundly hard of hearing which compromised communication. Heart sounds are markedly distant.  She has minor rales at the bases, greater on the right.  Abdomen is markedly protuberant.  Ostomy bag is present.  Pedal pulses are nonpalpable.  She has 1/2+ edema at the sock line bilaterally.  General appearance: Adequately nourished; no acute distress, increased work of breathing is present.   Lymphatic: No lymphadenopathy about the head, neck, axilla. Eyes: No conjunctival inflammation or lid edema is present. There is no scleral icterus. Ears:  External ear exam shows no significant lesions or deformities.   Nose:  External nasal examination shows no deformity or inflammation. Nasal mucosa are pink and moist without lesions, exudates Neck:  No thyromegaly, masses, tenderness noted.    Heart:  No gallop, murmur, click, rub .  Lungs:  without wheezes, rhonchi, rubs. Abdomen: Bowel sounds are normal. Abdomen is soft and nontender with no organomegaly, hernias, masses. GU: Deferred  Extremities:  No cyanosis, clubbing Neurologic exam :Balance, Rhomberg, finger  to nose testing  could not be completed due to clinical state Skin: Warm & dry w/o tenting. No significant lesions or rash.  See summary under each active problem in the Problem List with associated updated therapeutic plan

## 2023-02-09 NOTE — Assessment & Plan Note (Addendum)
She denies any active GI symptoms; she is on a PPI twice daily.

## 2023-02-09 NOTE — Patient Instructions (Signed)
See assessment and plan under each diagnosis in the problem list and acutely for this visit 

## 2023-02-16 ENCOUNTER — Non-Acute Institutional Stay (SKILLED_NURSING_FACILITY): Payer: Medicare Other | Admitting: Adult Health

## 2023-02-16 ENCOUNTER — Encounter: Payer: Self-pay | Admitting: Adult Health

## 2023-02-16 DIAGNOSIS — I700 Atherosclerosis of aorta: Secondary | ICD-10-CM

## 2023-02-16 DIAGNOSIS — F0150 Vascular dementia without behavioral disturbance: Secondary | ICD-10-CM

## 2023-02-16 DIAGNOSIS — Z933 Colostomy status: Secondary | ICD-10-CM

## 2023-02-16 DIAGNOSIS — I708 Atherosclerosis of other arteries: Secondary | ICD-10-CM

## 2023-02-16 NOTE — Progress Notes (Unsigned)
Location:  [facility] Room Number: 144P Place of Service:  SNF (31)   CODE STATUS: DNR Managed Care   No Known Allergies  Chief Complaint  Patient presents with   Acute Visit    Patient is being seen for acute care plan     HPI:    Past Medical History:  Diagnosis Date   Anxiety    Arthritis    Asthma    Cancer (HCC)    breast   Depression    Diverticulosis    GERD (gastroesophageal reflux disease)    HTN (hypertension)    Shortness of breath    exertion   Sleep apnea    STOP BANG 4   Thyroid dysfunction     Past Surgical History:  Procedure Laterality Date   ABDOMINAL HYSTERECTOMY     arthroscopic surgery rt knee  1997   BREAST SURGERY  2001   LEFT/ CANCER    broken radius ulna  1990   left   broken right ankle  1976   EXPLORATORY LAPAROTOMY  08/24/2020   with partial sigmoidectomy creat of end colostomy and application of negative pressure dressing.      JOINT REPLACEMENT  right knee 2005 [place]   left knee  2004   rt foot bone spur removed  1992   THYROID SURGERY  1983   TOTAL KNEE ARTHROPLASTY  01/22/2012   Procedure: TOTAL KNEE ARTHROPLASTY;  Surgeon: Vickki Hearing, MD;  Location: [facility];  Service: Orthopedics;  Laterality: Left;   TUBAL LIGATION      Social History   Socioeconomic History   Marital status: Widowed    Spouse name: Not on file   Number of children: Not on file   Years of education: Not on file   Highest education level: Not on file  Occupational History   Not on file  Tobacco Use   Smoking status: Former   Smokeless tobacco: Former  Building services engineer Use: Never used  Substance and Sexual Activity   Alcohol use: No   Drug use: No   Sexual activity: Never  Other Topics Concern   Not on file  Social History Narrative   Not on file   Social Determinants of Health   Financial Resource Strain: Not on file  Food Insecurity: Not on file  Transportation Needs: Not on file  Physical  Activity: Not on file  Stress: Not on file  Social Connections: Not on file  Intimate Partner Violence: Not on file   Family History  Problem Relation Age of Onset   Pseudochol deficiency Neg Hx    Malignant hyperthermia Neg Hx    Hypotension Neg Hx    Anesthesia problems Neg Hx       VITAL SIGNS BP 127/75   Pulse 66   Temp 97.8 F (36.6 C) (Temporal)   Resp 16   Ht 4\' 9"  (1.448 m)   Wt 177 lb 6.4 oz (80.5 kg)   SpO2 99%   BMI 38.39 kg/m   Outpatient Encounter Medications as of 02/16/2023  Medication Sig   acetaminophen (TYLENOL) 500 MG tablet Take 500 mg by mouth every 8 (eight) hours as needed.   acetaminophen (TYLENOL) 500 MG tablet Take 500 mg by mouth at bedtime.   atenolol (TENORMIN) 50 MG tablet Take 50 mg by mouth every evening.   BREO ELLIPTA 200-25 MCG/INH AEPB Inhale 1 puff into the lungs daily.   Calcium Carb-Cholecalciferol (CALCIUM 500 + D  PO) Take 1 tablet by mouth daily.   Cholecalciferol (VITAMIN D3) 50 MCG (2000 UT) TABS Take 1 tablet by mouth daily.   Cyanocobalamin 1000 MCG/ML KIT Inject as directed. Once A Day on the 10th of the Month   furosemide (LASIX) 20 MG tablet Take 20 mg by mouth daily.   loratadine (CLARITIN) 10 MG tablet Take 10 mg by mouth daily.   melatonin 5 MG TABS Take 5 mg by mouth.   Multiple Vitamins-Minerals (PRESERVISION AREDS 2) CAPS Take 1 capsule by mouth in the morning and at bedtime.   NON FORMULARY Diet:Regular   nystatin (MYCOSTATIN/NYSTOP) powder Apply 1 application topically daily as needed. Special Instructions: Apply thin layer of Nystatin powder to skin around stoma when changing colostomy bag PRN irritation.   pantoprazole (PROTONIX) 40 MG tablet Take 40 mg by mouth 2 (two) times daily.   potassium chloride SA (KLOR-CON) 20 MEQ tablet Take 40 mEq by mouth 2 (two) times daily.   melatonin 3 MG TABS tablet Take 3 mg by mouth at bedtime. (Patient not taking: Reported on 02/16/2023)   No facility-administered encounter  medications on file as of 02/16/2023.     SIGNIFICANT DIAGNOSTIC EXAMS       ASSESSMENT/ PLAN:     Synthia Innocent NP [facility]  Contact [phone] Monday through Friday 8am- 5pm  After hours call [phone]

## 2023-03-12 ENCOUNTER — Non-Acute Institutional Stay (SKILLED_NURSING_FACILITY): Payer: Medicare Other | Admitting: Adult Health

## 2023-03-12 DIAGNOSIS — M810 Age-related osteoporosis without current pathological fracture: Secondary | ICD-10-CM | POA: Diagnosis not present

## 2023-03-12 DIAGNOSIS — E538 Deficiency of other specified B group vitamins: Secondary | ICD-10-CM

## 2023-03-12 DIAGNOSIS — R600 Localized edema: Secondary | ICD-10-CM

## 2023-03-13 ENCOUNTER — Encounter: Payer: Self-pay | Admitting: Adult Health

## 2023-03-13 NOTE — Progress Notes (Signed)
Location:  [facility] Room Number: 144 Place of Service:   SNF   CODE STATUS: dnr   No Known Allergies  Chief Complaint  Patient presents with   Medical Management of Chronic Issues               Post menopausal osteoporosis: Vitamin B 12 deficiency: Bilateral lower extremity edema;     HPI:  She is a 89 year old long term resident of this facility being seen for the management of her chronic illnesses: Post menopausal osteoporosis: Vitamin B 12 deficiency: Bilateral lower extremity edema. There are no reports of uncontrolled pain. Her weight is stable and her appetite is good.   Past Medical History:  Diagnosis Date   Anxiety    Arthritis    Asthma    Cancer (HCC)    breast   Depression    Diverticulosis    GERD (gastroesophageal reflux disease)    HTN (hypertension)    Shortness of breath    exertion   Sleep apnea    STOP BANG 4   Thyroid dysfunction     Past Surgical History:  Procedure Laterality Date   ABDOMINAL HYSTERECTOMY     arthroscopic surgery rt knee  1997   BREAST SURGERY  2001   LEFT/ CANCER    broken radius ulna  1990   left   broken right ankle  1976   EXPLORATORY LAPAROTOMY  08/24/2020   with partial sigmoidectomy creat of end colostomy and application of negative pressure dressing.      JOINT REPLACEMENT  right knee 2005 [place]   left knee  2004   rt foot bone spur removed  1992   THYROID SURGERY  1983   TOTAL KNEE ARTHROPLASTY  01/22/2012   Procedure: TOTAL KNEE ARTHROPLASTY;  Surgeon: Vickki Hearing, MD;  Location: [facility];  Service: Orthopedics;  Laterality: Left;   TUBAL LIGATION      Social History   Socioeconomic History   Marital status: Widowed    Spouse name: Not on file   Number of children: Not on file   Years of education: Not on file   Highest education level: Not on file  Occupational History   Not on file  Tobacco Use   Smoking status: Former   Smokeless tobacco: Former  Haematologist Use: Never used  Substance and Sexual Activity   Alcohol use: No   Drug use: No   Sexual activity: Never  Other Topics Concern   Not on file  Social History Narrative   Not on file   Social Determinants of Health   Financial Resource Strain: Not on file  Food Insecurity: Not on file  Transportation Needs: Not on file  Physical Activity: Not on file  Stress: Not on file  Social Connections: Not on file  Intimate Partner Violence: Not on file   Family History  Problem Relation Age of Onset   Pseudochol deficiency Neg Hx    Malignant hyperthermia Neg Hx    Hypotension Neg Hx    Anesthesia problems Neg Hx       VITAL SIGNS BP (!) 111/58   Pulse 64   Temp (!) 97.3 F (36.3 C)   Resp 20   Ht 4\' 9"  (1.448 m)   Wt 175 lb (79.4 kg)   SpO2 95%   BMI 37.87 kg/m   Outpatient Encounter Medications as of 03/12/2023  Medication Sig   acetaminophen (TYLENOL) 500  MG tablet Take 500 mg by mouth every 8 (eight) hours as needed.   acetaminophen (TYLENOL) 500 MG tablet Take 500 mg by mouth at bedtime.   atenolol (TENORMIN) 50 MG tablet Take 50 mg by mouth every evening.   BREO ELLIPTA 200-25 MCG/INH AEPB Inhale 1 puff into the lungs daily.   Calcium Carb-Cholecalciferol (CALCIUM 500 + D PO) Take 1 tablet by mouth daily.   Cholecalciferol (VITAMIN D3) 50 MCG (2000 UT) TABS Take 1 tablet by mouth daily.   Cyanocobalamin 1000 MCG/ML KIT Inject as directed. Once A Day on the 10th of the Month   furosemide (LASIX) 20 MG tablet Take 20 mg by mouth daily.   loratadine (CLARITIN) 10 MG tablet Take 10 mg by mouth daily.   melatonin 3 MG TABS tablet Take 3 mg by mouth at bedtime. (Patient not taking: Reported on 02/16/2023)   melatonin 5 MG TABS Take 5 mg by mouth.   Multiple Vitamins-Minerals (PRESERVISION AREDS 2) CAPS Take 1 capsule by mouth in the morning and at bedtime.   NON FORMULARY Diet:Regular   nystatin (MYCOSTATIN/NYSTOP) powder Apply 1 application topically daily as  needed. Special Instructions: Apply thin layer of Nystatin powder to skin around stoma when changing colostomy bag PRN irritation.   pantoprazole (PROTONIX) 40 MG tablet Take 40 mg by mouth 2 (two) times daily.   potassium chloride SA (KLOR-CON) 20 MEQ tablet Take 40 mEq by mouth 2 (two) times daily.   No facility-administered encounter medications on file as of 03/12/2023.     SIGNIFICANT DIAGNOSTIC EXAMS  PREVIOUS   03-02-21: DEXA: t score -3.258  NO NEW LABS.   LABS REVIEWED PREVIOUS  04-10-22: wbc 6.1; hgb 12.5; hct 37.9; mcv 101.6 plt 196; glucose 86; bun 17; creat 0.67; k+ 3.3; na++ 139; ca 9.0; na++ 9.0; gfr >60; protein 6.1; albumin 3.2 vit D 45.08 05-15-22: glucose 87; bun 14; creat 0.63; k+ 3.0; na++ 140; ca 8.8; gf >60 05-22-22: k+ 3.6   06-08-22: glucose 87; bun 14; creat 0.63; k+ 3.0; na++ 8.8; gfr >60 08-10-22: tsh 1.815 10-23-22: wbc 5.3; hgb 12.3; hct 37.5; mcv 102.5 plt 189; glucose 82; bun 14; creat 0.71; k+ 4.0; na++ 140; ca 8.9; gfr >60; protein 6.1 albumin 3.2   TODAY  12-27-12: vitamin B 12: 328 01-29-23: wb 5.4; hgb 12.2; hct 37.5; mcv 102.7 plt 207; glucose 76; bun 15; creat 0.73; k+ 3.7; na++ 140; ca 8.6; gfr >60 protein 6.0 albumin 3.1; tsh 1.955  Review of Systems  Constitutional:  Negative for malaise/fatigue.  Respiratory:  Negative for cough and shortness of breath.   Cardiovascular:  Negative for chest pain, palpitations and leg swelling.  Gastrointestinal:  Negative for abdominal pain, constipation and heartburn.  Musculoskeletal:  Negative for back pain, joint pain and myalgias.  Skin: Negative.   Neurological:  Negative for dizziness.  Psychiatric/Behavioral:  The patient is not nervous/anxious.    Physical Exam Constitutional:      General: She is not in acute distress.    Appearance: She is well-developed. She is not diaphoretic.  Neck:     Thyroid: No thyromegaly.  Cardiovascular:     Rate and Rhythm: Normal rate and regular rhythm.     Heart  sounds: Normal heart sounds.  Pulmonary:     Effort: Pulmonary effort is normal. No respiratory distress.     Breath sounds: Normal breath sounds.  Abdominal:     General: Bowel sounds are normal. There is no distension.  Palpations: Abdomen is soft.     Tenderness: There is no abdominal tenderness.     Comments: Colostomy   Musculoskeletal:        General: Normal range of motion.     Cervical back: Neck supple.     Right lower leg: No edema.     Left lower leg: No edema.  Lymphadenopathy:     Cervical: No cervical adenopathy.  Skin:    General: Skin is warm and dry.  Neurological:     Mental Status: She is alert. Mental status is at baseline.  Psychiatric:        Mood and Affect: Mood normal.     ASSESSMENT/ PLAN:  TODAY  Post menopausal osteoporosis: t score -3.253 will monitor   2. Vitamin B 12 deficiency: level 446; is on monthly injections   3. Bilateral lower extremity edema; is on lasix 20 mg daily with k+ 40 meq twice daily    PREVIOUS   4. Non-seasonal allergic rhinitis unspecified trigger: will continue claritin 10 mg daily   5. Essential hypertension b/p 112/60 will continue tenormin 50 mg daily   6. Hypokalemia k+ is 3.7 will continue k+ 40 meq twice daily   7. Major depression recurrent chronic: will continue melatonin nightly for sleep   8. Aortoiliac atherosclerosis (ct 08-21-20) is no on statin due to advanced age  14. History of large bowel obstruction is status post colostomy   10. Degenerative disc disease, lumbar: will continue tylenol 500 mg nightly   11. Moderate persistent asthma in adult: is on breo ellipta 200/5 mcg 1 puff daily   12. Macrocytic anemia: hgb 12.2     Synthia Innocent NP [facility]  call [phone]

## 2023-04-11 ENCOUNTER — Encounter: Payer: Self-pay | Admitting: Adult Health

## 2023-04-11 ENCOUNTER — Non-Acute Institutional Stay (SKILLED_NURSING_FACILITY): Payer: Medicare Other | Admitting: Adult Health

## 2023-04-11 DIAGNOSIS — E876 Hypokalemia: Secondary | ICD-10-CM

## 2023-04-11 DIAGNOSIS — I10 Essential (primary) hypertension: Secondary | ICD-10-CM

## 2023-04-11 DIAGNOSIS — J3089 Other allergic rhinitis: Secondary | ICD-10-CM

## 2023-04-11 NOTE — Progress Notes (Signed)
Location:  [facility] Room Number: 144 Place of Service:  SNF (31)   CODE STATUS: dnr   No Known Allergies  Chief Complaint  Patient presents with   Medical Management of Chronic Issues           Non-seasonal allergic rhinitis unspecified trigger:    Essential hypertension:  Hypokalemia     HPI:  She is a 89 year old long term resident of this facility being seen for the management of her chronic illnesses including: Non-seasonal allergic rhinitis unspecified trigger:    Essential hypertension:  Hypokalemia.  She denies any pain. She tells me that she is feeling good. Her allergies are presently being managed. She is wanting to be placed on the dental list as her dentures are falling apart.   Past Medical History:  Diagnosis Date   Anxiety    Arthritis    Asthma    Cancer (HCC)    breast   Depression    Diverticulosis    GERD (gastroesophageal reflux disease)    HTN (hypertension)    Shortness of breath    exertion   Sleep apnea    STOP BANG 4   Thyroid dysfunction     Past Surgical History:  Procedure Laterality Date   ABDOMINAL HYSTERECTOMY     arthroscopic surgery rt knee  1997   BREAST SURGERY  2001   LEFT/ CANCER    broken radius ulna  1990   left   broken right ankle  1976   EXPLORATORY LAPAROTOMY  08/24/2020   with partial sigmoidectomy creat of end colostomy and application of negative pressure dressing.      JOINT REPLACEMENT  right knee 2005 [place]   left knee  2004   rt foot bone spur removed  1992   THYROID SURGERY  1983   TOTAL KNEE ARTHROPLASTY  01/22/2012   Procedure: TOTAL KNEE ARTHROPLASTY;  Surgeon: Vickki Hearing, MD;  Location: [facility];  Service: Orthopedics;  Laterality: Left;   TUBAL LIGATION      Social History   Socioeconomic History   Marital status: Widowed    Spouse name: Not on file   Number of children: Not on file   Years of education: Not on file   Highest education level: Not on file   Occupational History   Not on file  Tobacco Use   Smoking status: Former   Smokeless tobacco: Former  Advertising account planner   Vaping status: Never Used  Substance and Sexual Activity   Alcohol use: No   Drug use: No   Sexual activity: Never  Other Topics Concern   Not on file  Social History Narrative   Not on file   Social Determinants of Health   Financial Resource Strain: Not on file  Food Insecurity: Not on file  Transportation Needs: Not on file  Physical Activity: Not on file  Stress: Not on file  Social Connections: Not on file  Intimate Partner Violence: Not on file   Family History  Problem Relation Age of Onset   Pseudochol deficiency Neg Hx    Malignant hyperthermia Neg Hx    Hypotension Neg Hx    Anesthesia problems Neg Hx       VITAL SIGNS BP 136/78   Pulse 74   Temp 98.1 F (36.7 C)   Resp 20   Ht 4\' 9"  (1.448 m)   Wt 174 lb (78.9 kg)   SpO2 95%   BMI 37.65 kg/m  Outpatient Encounter Medications as of 04/11/2023  Medication Sig   acetaminophen (TYLENOL) 500 MG tablet Take 500 mg by mouth every 8 (eight) hours as needed.   acetaminophen (TYLENOL) 500 MG tablet Take 500 mg by mouth at bedtime.   atenolol (TENORMIN) 50 MG tablet Take 50 mg by mouth every evening.   BREO ELLIPTA 200-25 MCG/INH AEPB Inhale 1 puff into the lungs daily.   Calcium Carb-Cholecalciferol (CALCIUM 500 + D PO) Take 1 tablet by mouth daily.   Cholecalciferol (VITAMIN D3) 50 MCG (2000 UT) TABS Take 1 tablet by mouth daily.   Cyanocobalamin 1000 MCG/ML KIT Inject as directed. Once A Day on the 10th of the Month   furosemide (LASIX) 20 MG tablet Take 20 mg by mouth daily.   loratadine (CLARITIN) 10 MG tablet Take 10 mg by mouth daily.   melatonin 3 MG TABS tablet Take 3 mg by mouth at bedtime. (Patient not taking: Reported on 02/16/2023)   melatonin 5 MG TABS Take 5 mg by mouth.   Multiple Vitamins-Minerals (PRESERVISION AREDS 2) CAPS Take 1 capsule by mouth in the morning and at  bedtime.   NON FORMULARY Diet:Regular   nystatin (MYCOSTATIN/NYSTOP) powder Apply 1 application topically daily as needed. Special Instructions: Apply thin layer of Nystatin powder to skin around stoma when changing colostomy bag PRN irritation.   pantoprazole (PROTONIX) 40 MG tablet Take 40 mg by mouth 2 (two) times daily.   potassium chloride SA (KLOR-CON) 20 MEQ tablet Take 40 mEq by mouth 2 (two) times daily.   No facility-administered encounter medications on file as of 04/11/2023.     SIGNIFICANT DIAGNOSTIC EXAMS  PREVIOUS   03-02-21: DEXA: t score -3.258  NO NEW LABS.   LABS REVIEWED PREVIOUS  04-10-22: wbc 6.1; hgb 12.5; hct 37.9; mcv 101.6 plt 196; glucose 86; bun 17; creat 0.67; k+ 3.3; na++ 139; ca 9.0; na++ 9.0; gfr >60; protein 6.1; albumin 3.2 vit D 45.08 05-15-22: glucose 87; bun 14; creat 0.63; k+ 3.0; na++ 140; ca 8.8; gf >60 05-22-22: k+ 3.6   06-08-22: glucose 87; bun 14; creat 0.63; k+ 3.0; na++ 8.8; gfr >60 08-10-22: tsh 1.815 10-23-22: wbc 5.3; hgb 12.3; hct 37.5; mcv 102.5 plt 189; glucose 82; bun 14; creat 0.71; k+ 4.0; na++ 140; ca 8.9; gfr >60; protein 6.1 albumin 3.2  12-27-12: vitamin B 12: 328 01-29-23: wb 5.4; hgb 12.2; hct 37.5; mcv 102.7 plt 207; glucose 76; bun 15; creat 0.73; k+ 3.7; na++ 140; ca 8.6; gfr >60 protein 6.0 albumin 3.1; tsh 1.955  NO NEW LABS.   Review of Systems  Constitutional:  Negative for malaise/fatigue.  Respiratory:  Negative for cough and shortness of breath.   Cardiovascular:  Negative for chest pain, palpitations and leg swelling.  Gastrointestinal:  Negative for abdominal pain, constipation and heartburn.  Musculoskeletal:  Negative for back pain, joint pain and myalgias.  Skin: Negative.   Neurological:  Negative for dizziness.  Psychiatric/Behavioral:  The patient is not nervous/anxious.    Physical Exam Constitutional:      General: She is not in acute distress.    Appearance: She is well-developed. She is obese. She is  not diaphoretic.  Neck:     Thyroid: No thyromegaly.  Cardiovascular:     Rate and Rhythm: Normal rate and regular rhythm.     Pulses: Normal pulses.     Heart sounds: Normal heart sounds.  Pulmonary:     Effort: Pulmonary effort is normal. No respiratory distress.  Breath sounds: Normal breath sounds.  Abdominal:     General: Bowel sounds are normal. There is no distension.     Palpations: Abdomen is soft.     Tenderness: There is no abdominal tenderness.     Comments: colostomy  Musculoskeletal:        General: Normal range of motion.     Cervical back: Neck supple.     Right lower leg: No edema.     Left lower leg: No edema.  Lymphadenopathy:     Cervical: No cervical adenopathy.  Skin:    General: Skin is warm and dry.  Neurological:     Mental Status: She is alert. Mental status is at baseline.     Comments: 02-09-23: SLUMS: 17/30  Psychiatric:        Mood and Affect: Mood normal.       ASSESSMENT/ PLAN:  TODAY  Non-seasonal allergic rhinitis unspecified trigger: will continue claritin 10 mg daily  2. Essential hypertension: 136/78 will continue tenormin 50 mg daily   3. Hypokalemia: k+ 3.7 will continue k+ 40 meq twice daily    PREVIOUS   4. Major depression recurrent chronic: will continue melatonin nightly for sleep   5. Aortoiliac atherosclerosis (ct 08-21-20) is no on statin due to advanced age  73. History of large bowel obstruction is status post colostomy   7. Degenerative disc disease, lumbar: will continue tylenol 500 mg nightly   8. Moderate persistent asthma in adult: is on breo ellipta 200/5 mcg 1 puff daily   9. Macrocytic anemia: hgb 12.2   10. Post menopausal osteoporosis: t score -3.253 will monitor   11. Vitamin B 12 deficiency: level 446; is on monthly injections   12. Bilateral lower extremity edema; is on lasix 20 mg daily with k+ 40 meq twice daily   13. Vascular dementia without behavioral disturbance: weight is 174 pounds,          Synthia Innocent NP [facility]  call [phone]

## 2023-05-18 ENCOUNTER — Non-Acute Institutional Stay (SKILLED_NURSING_FACILITY): Payer: Medicare Other | Admitting: Adult Health

## 2023-05-18 ENCOUNTER — Encounter: Payer: Self-pay | Admitting: Adult Health

## 2023-05-18 DIAGNOSIS — F0150 Vascular dementia without behavioral disturbance: Secondary | ICD-10-CM | POA: Diagnosis not present

## 2023-05-18 DIAGNOSIS — I708 Atherosclerosis of other arteries: Secondary | ICD-10-CM

## 2023-05-18 DIAGNOSIS — I700 Atherosclerosis of aorta: Secondary | ICD-10-CM

## 2023-05-18 DIAGNOSIS — Z933 Colostomy status: Secondary | ICD-10-CM | POA: Diagnosis not present

## 2023-05-18 NOTE — Progress Notes (Signed)
Location:  [facility] Room Number: 144 Place of Service:  SNF (31)   CODE STATUS: dnr   No Known Allergies  Chief Complaint  Patient presents with   Acute Visit    Care plan meeting     HPI:  We have come together for her care plan meeting. BIMS 15/15 mood 0/30. SLUMS 17/30. She uses wheelchair without falls. She requires independent to dependent assist with her adls. Dietary: is able to feed self; regular diet appetite 51-100%; weight is 171.8 pounds down 17 pounds over the past year; which is desirable loss. Therapy: none at this time. Activities: does participate. She will continue to be followed for her chronic illnesses including: Aorto-iliac atherosclerosis    Vascular dementia without behavioral disturbance    Colostomy status   Past Medical History:  Diagnosis Date   Anxiety    Arthritis    Asthma    Cancer (HCC)    breast   Depression    Diverticulosis    GERD (gastroesophageal reflux disease)    HTN (hypertension)    Shortness of breath    exertion   Sleep apnea    STOP BANG 4   Thyroid dysfunction     Past Surgical History:  Procedure Laterality Date   ABDOMINAL HYSTERECTOMY     arthroscopic surgery rt knee  1997   BREAST SURGERY  2001   LEFT/ CANCER    broken radius ulna  1990   left   broken right ankle  1976   EXPLORATORY LAPAROTOMY  08/24/2020   with partial sigmoidectomy creat of end colostomy and application of negative pressure dressing.      JOINT REPLACEMENT  right knee 2005 [place]   left knee  2004   rt foot bone spur removed  1992   THYROID SURGERY  1983   TOTAL KNEE ARTHROPLASTY  01/22/2012   Procedure: TOTAL KNEE ARTHROPLASTY;  Surgeon: Vickki Hearing, MD;  Location: [facility];  Service: Orthopedics;  Laterality: Left;   TUBAL LIGATION      Social History   Socioeconomic History   Marital status: Widowed    Spouse name: Not on file   Number of children: Not on file   Years of education: Not on file    Highest education level: Not on file  Occupational History   Not on file  Tobacco Use   Smoking status: Former   Smokeless tobacco: Former  Advertising account planner   Vaping status: Never Used  Substance and Sexual Activity   Alcohol use: No   Drug use: No   Sexual activity: Never  Other Topics Concern   Not on file  Social History Narrative   Not on file   Social Determinants of Health   Financial Resource Strain: Not on file  Food Insecurity: Not on file  Transportation Needs: Not on file  Physical Activity: Not on file  Stress: Not on file  Social Connections: Not on file  Intimate Partner Violence: Not on file   Family History  Problem Relation Age of Onset   Pseudochol deficiency Neg Hx    Malignant hyperthermia Neg Hx    Hypotension Neg Hx    Anesthesia problems Neg Hx       VITAL SIGNS BP 116/62   Pulse 68   Temp 98 F (36.7 C)   Resp 20   Ht 4\' 9"  (1.448 m)   Wt 171 lb 12.8 oz (77.9 kg)   SpO2 94%   BMI  37.18 kg/m   Outpatient Encounter Medications as of 05/18/2023  Medication Sig   acetaminophen (TYLENOL) 500 MG tablet Take 500 mg by mouth every 8 (eight) hours as needed.   acetaminophen (TYLENOL) 500 MG tablet Take 500 mg by mouth at bedtime.   atenolol (TENORMIN) 50 MG tablet Take 50 mg by mouth every evening.   BREO ELLIPTA 200-25 MCG/INH AEPB Inhale 1 puff into the lungs daily.   Calcium Carb-Cholecalciferol (CALCIUM 500 + D PO) Take 1 tablet by mouth daily.   Cholecalciferol (VITAMIN D3) 50 MCG (2000 UT) TABS Take 1 tablet by mouth daily.   Cyanocobalamin 1000 MCG/ML KIT Inject as directed. Once A Day on the 10th of the Month   furosemide (LASIX) 20 MG tablet Take 20 mg by mouth daily.   loratadine (CLARITIN) 10 MG tablet Take 10 mg by mouth daily.   melatonin 5 MG TABS Take 5 mg by mouth.   Multiple Vitamins-Minerals (PRESERVISION AREDS 2) CAPS Take 1 capsule by mouth in the morning and at bedtime.   NON FORMULARY Diet:Regular   nystatin  (MYCOSTATIN/NYSTOP) powder Apply 1 application topically daily as needed. Special Instructions: Apply thin layer of Nystatin powder to skin around stoma when changing colostomy bag PRN irritation.   pantoprazole (PROTONIX) 40 MG tablet Take 40 mg by mouth 2 (two) times daily.   potassium chloride SA (KLOR-CON) 20 MEQ tablet Take 40 mEq by mouth 2 (two) times daily.   [DISCONTINUED] melatonin 3 MG TABS tablet Take 3 mg by mouth at bedtime. (Patient not taking: Reported on 02/16/2023)   No facility-administered encounter medications on file as of 05/18/2023.     SIGNIFICANT DIAGNOSTIC EXAMS  PREVIOUS   03-02-21: DEXA: t score -3.258  NO NEW LABS.   LABS REVIEWED PREVIOUS  05-15-22: glucose 87; bun 14; creat 0.63; k+ 3.0; na++ 140; ca 8.8; gf >60 05-22-22: k+ 3.6   06-08-22: glucose 87; bun 14; creat 0.63; k+ 3.0; na++ 8.8; gfr >60 08-10-22: tsh 1.815 10-23-22: wbc 5.3; hgb 12.3; hct 37.5; mcv 102.5 plt 189; glucose 82; bun 14; creat 0.71; k+ 4.0; na++ 140; ca 8.9; gfr >60; protein 6.1 albumin 3.2  12-27-12: vitamin B 12: 328 01-29-23: wbc 5.4; hgb 12.2; hct 37.5; mcv 102.7 plt 207; glucose 76; bun 15; creat 0.73; k+ 3.7; na++ 140; ca 8.6; gfr >60 protein 6.0 albumin 3.1; tsh 1.955  NO NEW LABS.   Review of Systems  Constitutional:  Negative for malaise/fatigue.  Respiratory:  Negative for cough and shortness of breath.   Cardiovascular:  Negative for chest pain, palpitations and leg swelling.  Gastrointestinal:  Negative for abdominal pain, constipation and heartburn.  Musculoskeletal:  Negative for back pain, joint pain and myalgias.  Skin: Negative.   Neurological:  Negative for dizziness.  Psychiatric/Behavioral:  The patient is not nervous/anxious.     Physical Exam Constitutional:      General: She is not in acute distress.    Appearance: She is well-developed. She is obese. She is not diaphoretic.  Neck:     Thyroid: No thyromegaly.  Cardiovascular:     Rate and Rhythm: Normal  rate and regular rhythm.     Pulses: Normal pulses.     Heart sounds: Normal heart sounds.  Pulmonary:     Effort: Pulmonary effort is normal. No respiratory distress.     Breath sounds: Normal breath sounds.  Abdominal:     General: Bowel sounds are normal. There is no distension.     Palpations:  Abdomen is soft.     Tenderness: There is no abdominal tenderness.     Comments: colostomy  Musculoskeletal:        General: Normal range of motion.     Cervical back: Neck supple.     Right lower leg: No edema.     Left lower leg: No edema.  Lymphadenopathy:     Cervical: No cervical adenopathy.  Skin:    General: Skin is warm and dry.  Neurological:     Mental Status: She is alert. Mental status is at baseline.     Comments: 02-20-23: SLUMS 17/30  Psychiatric:        Mood and Affect: Mood normal.       ASSESSMENT/ PLAN:  TODAY  Aorto-iliac atherosclerosis Vascular dementia without behavioral disturbance Colostomy status   Will continue current medications Will continue current plan of care Will continue to monitor her status.   Time spent with patient 40 minutes: medications; plan of care activities.    Synthia Innocent NP [facility]   call [phone]

## 2023-05-25 ENCOUNTER — Non-Acute Institutional Stay: Payer: Self-pay | Admitting: Internal Medicine

## 2023-05-25 ENCOUNTER — Encounter: Payer: Self-pay | Admitting: Internal Medicine

## 2023-05-25 DIAGNOSIS — I10 Essential (primary) hypertension: Secondary | ICD-10-CM

## 2023-05-25 DIAGNOSIS — J4540 Moderate persistent asthma, uncomplicated: Secondary | ICD-10-CM

## 2023-05-25 DIAGNOSIS — M19042 Primary osteoarthritis, left hand: Secondary | ICD-10-CM | POA: Diagnosis not present

## 2023-05-25 DIAGNOSIS — M19041 Primary osteoarthritis, right hand: Secondary | ICD-10-CM

## 2023-05-25 NOTE — Progress Notes (Signed)
   NURSING HOME LOCATION:  [facility] ROOM NUMBER:  [id number]  CODE STATUS:  DNR  PCP:  Synthia Innocent NP  This is a nursing facility follow up visit of chronic medical diagnoses & to document compliance with Regulation 483.30 (c) in The Long Term Care Survey Manual Phase 2 which mandates caregiver visit ( visits can alternate among physician, PA or NP as per statutes) within 10 days of 30 days / 60 days/ 90 days post admission to SNF date    Interim medical record and care since last SNF visit was updated with review of diagnostic studies and change in clinical status since last visit were documented.  HPI: She is a permanent resident of this facility with medical diagnoses of thyroid dysfunction,OSA,essential HTN,GERD,history of diverticulosis, history of breast cancer,history of asthma,& aortic atherosclerosis. Last labs were 5/6 revealing hypocalcemia (8.6) & protein/caloric malnutrition (albumin 3.0 & total protein 6.0). Macrocytosis (MCV 102.7) is present w/o anemia. TSH was therapeutic @ 1.955.  Review of systems: Some neurocognitive deficit is suggested as she repeated herself in reference to her primary concern of wanting new dentures.  She made the comment "my false teeth wearing out."  She wanted to know where she can get these replaced.  Her only other complaint was pain over the MCP joints of the right hand.  Constitutional: No fever, significant weight change, fatigue  Eyes: No redness, discharge, pain, vision change ENT/mouth: No nasal congestion,  purulent discharge, earache, change in hearing, sore throat  Cardiovascular: No chest pain, palpitations, paroxysmal nocturnal dyspnea, claudication, edema  Respiratory: No cough, sputum production, hemoptysis, DOE, significant snoring, apnea   Gastrointestinal: No heartburn, dysphagia, abdominal pain, nausea /vomiting, rectal bleeding, melena, change in bowels Genitourinary: No dysuria, hematuria, pyuria, incontinence,  nocturia Dermatologic: No rash, pruritus, change in appearance of skin Neurologic: No dizziness, headache, syncope, seizures, numbness, tingling Psychiatric: No significant anxiety, depression, insomnia, anorexia Endocrine: No change in hair/skin/nails, excessive thirst, excessive hunger, excessive urination  Hematologic/lymphatic: No significant bruising, lymphadenopathy, abnormal bleeding Allergy/immunology: No itchy/watery eyes, significant sneezing, urticaria, angioedema  Physical exam:  Pertinent or positive findings: She is markedly hard of hearing.  She had does have complete dentures.  Heart rate is slow and regular.  Breath sounds are decreased.  Abdomen is protuberant.  Ostomy is present left lower quadrant.  Pedal pulses are decreased.  She has diffuse nonpitting edema.  She has DIP osteoarthritic changes with lateral deviation of the fingers greater on the right than the left.  General appearance: Adequately nourished; no acute distress, increased work of breathing is present.   Lymphatic: No lymphadenopathy about the head, neck, axilla. Eyes: No conjunctival inflammation or lid edema is present. There is no scleral icterus. Ears:  External ear exam shows no significant lesions or deformities.   Nose:  External nasal examination shows no deformity or inflammation. Nasal mucosa are pink and moist without lesions, exudates Neck:  No thyromegaly, masses, tenderness noted.    Heart:  No gallop, murmur, click, rub .  Lungs:  without wheezes, rhonchi, rales, rubs. Abdomen: Bowel sounds are normal. Abdomen is soft and nontender with no organomegaly, hernias, masses. GU: Deferred  Extremities:  No cyanosis, clubbing  Neurologic exam :Balance, Rhomberg, finger to nose testing could not be completed due to clinical state Skin: Warm & dry w/o tenting. No significant lesions or rash.  See summary under each active problem in the Problem List with associated updated therapeutic plan

## 2023-05-25 NOTE — Assessment & Plan Note (Signed)
BP controlled; no change in antihypertensive medications  

## 2023-05-25 NOTE — Patient Instructions (Addendum)
See assessment and plan under each diagnosis in the problem list and acutely for this visit.  Because of her concerns about her dentures "being worn out"; referral was made to the Child psychotherapist.

## 2023-05-25 NOTE — Assessment & Plan Note (Addendum)
On the current pulmonary regimen she denies any active cardiopulmonary symptoms.  O2 sats are excellent.  No change indicated.

## 2023-05-25 NOTE — Assessment & Plan Note (Signed)
Trial of topical Voltaren gel twice daily over the right MCP joints.

## 2023-06-25 ENCOUNTER — Non-Acute Institutional Stay (SKILLED_NURSING_FACILITY): Payer: Self-pay | Admitting: Adult Health

## 2023-06-25 ENCOUNTER — Encounter: Payer: Self-pay | Admitting: Adult Health

## 2023-06-25 DIAGNOSIS — I708 Atherosclerosis of other arteries: Secondary | ICD-10-CM

## 2023-06-25 DIAGNOSIS — K56609 Unspecified intestinal obstruction, unspecified as to partial versus complete obstruction: Secondary | ICD-10-CM | POA: Diagnosis not present

## 2023-06-25 DIAGNOSIS — I700 Atherosclerosis of aorta: Secondary | ICD-10-CM

## 2023-06-25 DIAGNOSIS — Z933 Colostomy status: Secondary | ICD-10-CM

## 2023-06-25 DIAGNOSIS — F339 Major depressive disorder, recurrent, unspecified: Secondary | ICD-10-CM | POA: Diagnosis not present

## 2023-06-25 NOTE — Progress Notes (Signed)
Location:  [facility] Room Number: 144 Place of Service:  SNF (31)   CODE STATUS: dnr   No Known Allergies  Chief Complaint  Patient presents with   Medical Management of Chronic Issues        Major depression recurrent chronic:    Aortoiliac atherosclerosis   History of large bowel obstruction      HPI:  She is a 89 year old long term resident of this facility being seen for the management of her chronic illnesses:  Major depression recurrent chronic:    Aortoiliac atherosclerosis   History of large bowel obstruction. She continues to read and put together puzzles. She does socialize with others. She denies any pain. Her weight remains stable.    Past Medical History:  Diagnosis Date   Anxiety    Arthritis    Asthma    Cancer (HCC)    breast   Depression    Diverticulosis    GERD (gastroesophageal reflux disease)    HTN (hypertension)    Shortness of breath    exertion   Sleep apnea    STOP BANG 4   Thyroid dysfunction     Past Surgical History:  Procedure Laterality Date   ABDOMINAL HYSTERECTOMY     arthroscopic surgery rt knee  1997   BREAST SURGERY  2001   LEFT/ CANCER    broken radius ulna  1990   left   broken right ankle  1976   EXPLORATORY LAPAROTOMY  08/24/2020   with partial sigmoidectomy creat of end colostomy and application of negative pressure dressing.      JOINT REPLACEMENT  right knee 2005 [place]   left knee  2004   rt foot bone spur removed  1992   THYROID SURGERY  1983   TOTAL KNEE ARTHROPLASTY  01/22/2012   Procedure: TOTAL KNEE ARTHROPLASTY;  Surgeon: Vickki Hearing, MD;  Location: [facility];  Service: Orthopedics;  Laterality: Left;   TUBAL LIGATION      Social History   Socioeconomic History   Marital status: Widowed    Spouse name: Not on file   Number of children: Not on file   Years of education: Not on file   Highest education level: Not on file  Occupational History   Not on file  Tobacco Use    Smoking status: Former   Smokeless tobacco: Former  Advertising account planner   Vaping status: Never Used  Substance and Sexual Activity   Alcohol use: No   Drug use: No   Sexual activity: Never  Other Topics Concern   Not on file  Social History Narrative   Not on file   Social Determinants of Health   Financial Resource Strain: Not on file  Food Insecurity: Not on file  Transportation Needs: Not on file  Physical Activity: Not on file  Stress: Not on file  Social Connections: Not on file  Intimate Partner Violence: Not on file   Family History  Problem Relation Age of Onset   Pseudochol deficiency Neg Hx    Malignant hyperthermia Neg Hx    Hypotension Neg Hx    Anesthesia problems Neg Hx       VITAL SIGNS BP (!) 108/56   Pulse 66   Temp 97.7 F (36.5 C)   Resp 18   Ht 4\' 9"  (1.448 m)   Wt 172 lb 6.4 oz (78.2 kg)   SpO2 96%   BMI 37.31 kg/m   Outpatient Encounter  Medications as of 06/25/2023  Medication Sig   acetaminophen (TYLENOL) 500 MG tablet Take 500 mg by mouth every 8 (eight) hours as needed.   acetaminophen (TYLENOL) 500 MG tablet Take 500 mg by mouth at bedtime.   atenolol (TENORMIN) 50 MG tablet Take 50 mg by mouth every evening.   BREO ELLIPTA 200-25 MCG/INH AEPB Inhale 1 puff into the lungs daily.   Calcium Carb-Cholecalciferol (CALCIUM 500 + D PO) Take 1 tablet by mouth daily.   Cholecalciferol (VITAMIN D3) 50 MCG (2000 UT) TABS Take 1 tablet by mouth daily.   Cyanocobalamin 1000 MCG/ML KIT Inject as directed. Once A Day on the 10th of the Month   furosemide (LASIX) 20 MG tablet Take 20 mg by mouth daily.   loratadine (CLARITIN) 10 MG tablet Take 10 mg by mouth daily.   melatonin 5 MG TABS Take 5 mg by mouth.   Multiple Vitamins-Minerals (PRESERVISION AREDS 2) CAPS Take 1 capsule by mouth in the morning and at bedtime.   NON FORMULARY Diet:Regular   nystatin (MYCOSTATIN/NYSTOP) powder Apply 1 application topically daily as needed. Special Instructions:  Apply thin layer of Nystatin powder to skin around stoma when changing colostomy bag PRN irritation.   pantoprazole (PROTONIX) 40 MG tablet Take 40 mg by mouth 2 (two) times daily.   potassium chloride SA (KLOR-CON) 20 MEQ tablet Take 40 mEq by mouth 2 (two) times daily.   No facility-administered encounter medications on file as of 06/25/2023.     SIGNIFICANT DIAGNOSTIC EXAMS  PREVIOUS   03-02-21: DEXA: t score -3.258  NO NEW LABS.   LABS REVIEWED PREVIOUS    06-08-22: glucose 87; bun 14; creat 0.63; k+ 3.0; na++ 8.8; gfr >60 08-10-22: tsh 1.815 10-23-22: wbc 5.3; hgb 12.3; hct 37.5; mcv 102.5 plt 189; glucose 82; bun 14; creat 0.71; k+ 4.0; na++ 140; ca 8.9; gfr >60; protein 6.1 albumin 3.2  12-27-12: vitamin B 12: 328 01-29-23: wb 5.4; hgb 12.2; hct 37.5; mcv 102.7 plt 207; glucose 76; bun 15; creat 0.73; k+ 3.7; na++ 140; ca 8.6; gfr >60 protein 6.0 albumin 3.1; tsh 1.955  NO NEW LABS.   Review of Systems  Constitutional:  Negative for malaise/fatigue.  Respiratory:  Negative for cough and shortness of breath.   Cardiovascular:  Negative for chest pain, palpitations and leg swelling.  Gastrointestinal:  Negative for abdominal pain, constipation and heartburn.  Musculoskeletal:  Negative for back pain, joint pain and myalgias.  Skin: Negative.   Neurological:  Negative for dizziness.  Psychiatric/Behavioral:  The patient is not nervous/anxious.    Physical Exam Constitutional:      General: She is not in acute distress.    Appearance: She is well-developed. She is obese. She is not diaphoretic.  Neck:     Thyroid: No thyromegaly.  Cardiovascular:     Rate and Rhythm: Normal rate and regular rhythm.     Pulses: Normal pulses.     Heart sounds: Normal heart sounds.  Pulmonary:     Effort: Pulmonary effort is normal. No respiratory distress.     Breath sounds: Normal breath sounds.  Abdominal:     General: Bowel sounds are normal. There is no distension.     Palpations:  Abdomen is soft.     Tenderness: There is no abdominal tenderness.     Comments: Colostomy   Musculoskeletal:        General: Normal range of motion.     Cervical back: Neck supple.  Right lower leg: No edema.     Left lower leg: No edema.  Lymphadenopathy:     Cervical: No cervical adenopathy.  Skin:    General: Skin is warm and dry.  Neurological:     Mental Status: She is alert. Mental status is at baseline.     Comments: 02-09-23: SLUMS: 17/30   Psychiatric:        Mood and Affect: Mood normal.        ASSESSMENT/ PLAN:  TODAY  Major depression recurrent chronic: will continue melatonin nightly for sleep  2. Aortoiliac atherosclerosis (ct 08-21-20) is not on statin due to advanced age.   3. History of large bowel obstruction is status post colostomy     PREVIOUS   4. Degenerative disc disease, lumbar: will continue tylenol 500 mg nightly   5. Moderate persistent asthma in adult: is on breo ellipta 200/5 mcg 1 puff daily   6. Macrocytic anemia: hgb 12.2   7. Post menopausal osteoporosis: t score -3.253 will monitor   8. Vitamin B 12 deficiency: level 446; is on monthly injections   9. Bilateral lower extremity edema; is on lasix 20 mg daily with k+ 40 meq twice daily   10. Vascular dementia without behavioral disturbance: weight is 172 pounds,   11. Non-seasonal allergic rhinitis unspecified trigger: will continue claritin 10 mg daily  12. Essential hypertension: 108/56 will continue tenormin 50 mg daily   13. Hypokalemia: k+ 3.7 will continue k+ 40 meq twice daily     Synthia Innocent NP [facility]  call [phone]

## 2023-07-03 DIAGNOSIS — Z23 Encounter for immunization: Secondary | ICD-10-CM | POA: Diagnosis not present

## 2023-07-03 DIAGNOSIS — Z433 Encounter for attention to colostomy: Secondary | ICD-10-CM | POA: Diagnosis not present

## 2023-07-23 DIAGNOSIS — Z23 Encounter for immunization: Secondary | ICD-10-CM | POA: Diagnosis not present

## 2023-07-23 DIAGNOSIS — Z433 Encounter for attention to colostomy: Secondary | ICD-10-CM | POA: Diagnosis not present

## 2023-07-26 ENCOUNTER — Encounter: Payer: Self-pay | Admitting: Adult Health

## 2023-07-26 ENCOUNTER — Non-Acute Institutional Stay (SKILLED_NURSING_FACILITY): Payer: Medicare Other | Admitting: Adult Health

## 2023-07-26 DIAGNOSIS — M47816 Spondylosis without myelopathy or radiculopathy, lumbar region: Secondary | ICD-10-CM | POA: Diagnosis not present

## 2023-07-26 DIAGNOSIS — D539 Nutritional anemia, unspecified: Secondary | ICD-10-CM | POA: Diagnosis not present

## 2023-07-26 DIAGNOSIS — J4540 Moderate persistent asthma, uncomplicated: Secondary | ICD-10-CM | POA: Diagnosis not present

## 2023-07-26 NOTE — Progress Notes (Signed)
Location:  [facility] Room Number: 144 Place of Service:  SNF (31)   CODE STATUS: dnr   No Known Allergies  Chief Complaint  Patient presents with   Medical Management of Chronic Issues           Degenerative disc disease, lumbar:      Moderate persistent asthma in adult:   Macrocytic anemia     HPI:  She is a 89 year old long term resident of this facility being seen for the management of her chronic illnesses: Degenerative disc disease, lumbar:      Moderate persistent asthma in adult:   Macrocytic anemia. There are no reports of uncontrolled pain; her weight is stable; there are no reports of anxiety or depressive thoughts.   Past Medical History:  Diagnosis Date   Anxiety    Arthritis    Asthma    Cancer (HCC)    breast   Depression    Diverticulosis    GERD (gastroesophageal reflux disease)    HTN (hypertension)    Shortness of breath    exertion   Sleep apnea    STOP BANG 4   Thyroid dysfunction     Past Surgical History:  Procedure Laterality Date   ABDOMINAL HYSTERECTOMY     arthroscopic surgery rt knee  1997   BREAST SURGERY  2001   LEFT/ CANCER    broken radius ulna  1990   left   broken right ankle  1976   EXPLORATORY LAPAROTOMY  08/24/2020   with partial sigmoidectomy creat of end colostomy and application of negative pressure dressing.      JOINT REPLACEMENT  right knee 2005 [place]   left knee  2004   rt foot bone spur removed  1992   THYROID SURGERY  1983   TOTAL KNEE ARTHROPLASTY  01/22/2012   Procedure: TOTAL KNEE ARTHROPLASTY;  Surgeon: Vickki Hearing, MD;  Location: [facility];  Service: Orthopedics;  Laterality: Left;   TUBAL LIGATION      Social History   Socioeconomic History   Marital status: Widowed    Spouse name: Not on file   Number of children: Not on file   Years of education: Not on file   Highest education level: Not on file  Occupational History   Not on file  Tobacco Use   Smoking status:  Former   Smokeless tobacco: Former  Advertising account planner   Vaping status: Never Used  Substance and Sexual Activity   Alcohol use: No   Drug use: No   Sexual activity: Never  Other Topics Concern   Not on file  Social History Narrative   Not on file   Social Determinants of Health   Financial Resource Strain: Not on file  Food Insecurity: Not on file  Transportation Needs: Not on file  Physical Activity: Not on file  Stress: Not on file  Social Connections: Not on file  Intimate Partner Violence: Not on file   Family History  Problem Relation Age of Onset   Pseudochol deficiency Neg Hx    Malignant hyperthermia Neg Hx    Hypotension Neg Hx    Anesthesia problems Neg Hx       VITAL SIGNS BP 115/67   Pulse 68   Temp 97.6 F (36.4 C)   Resp 18   Ht 4\' 9"  (1.448 m)   Wt 171 lb (77.6 kg)   SpO2 96%   BMI 37.00 kg/m   Outpatient Encounter  Medications as of 07/26/2023  Medication Sig   acetaminophen (TYLENOL) 500 MG tablet Take 500 mg by mouth every 8 (eight) hours as needed.   acetaminophen (TYLENOL) 500 MG tablet Take 500 mg by mouth at bedtime.   atenolol (TENORMIN) 50 MG tablet Take 50 mg by mouth every evening.   BREO ELLIPTA 200-25 MCG/INH AEPB Inhale 1 puff into the lungs daily.   Calcium Carb-Cholecalciferol (CALCIUM 500 + D PO) Take 1 tablet by mouth daily.   Cholecalciferol (VITAMIN D3) 50 MCG (2000 UT) TABS Take 1 tablet by mouth daily.   Cyanocobalamin 1000 MCG/ML KIT Inject as directed. Once A Day on the 10th of the Month   furosemide (LASIX) 20 MG tablet Take 20 mg by mouth daily.   loratadine (CLARITIN) 10 MG tablet Take 10 mg by mouth daily.   melatonin 5 MG TABS Take 5 mg by mouth.   Multiple Vitamins-Minerals (PRESERVISION AREDS 2) CAPS Take 1 capsule by mouth in the morning and at bedtime.   NON FORMULARY Diet:Regular   nystatin (MYCOSTATIN/NYSTOP) powder Apply 1 application topically daily as needed. Special Instructions: Apply thin layer of Nystatin  powder to skin around stoma when changing colostomy bag PRN irritation.   pantoprazole (PROTONIX) 40 MG tablet Take 40 mg by mouth 2 (two) times daily.   potassium chloride SA (KLOR-CON) 20 MEQ tablet Take 40 mEq by mouth 2 (two) times daily.   No facility-administered encounter medications on file as of 07/26/2023.     SIGNIFICANT DIAGNOSTIC EXAMS  PREVIOUS   03-02-21: DEXA: t score -3.258  NO NEW LABS.   LABS REVIEWED PREVIOUS    08-10-22: tsh 1.815 10-23-22: wbc 5.3; hgb 12.3; hct 37.5; mcv 102.5 plt 189; glucose 82; bun 14; creat 0.71; k+ 4.0; na++ 140; ca 8.9; gfr >60; protein 6.1 albumin 3.2  12-27-12: vitamin B 12: 328 01-29-23: wb 5.4; hgb 12.2; hct 37.5; mcv 102.7 plt 207; glucose 76; bun 15; creat 0.73; k+ 3.7; na++ 140; ca 8.6; gfr >60 protein 6.0 albumin 3.1; tsh 1.955  NO NEW LABS.   Review of Systems  Constitutional:  Negative for malaise/fatigue.  Respiratory:  Negative for cough and shortness of breath.   Cardiovascular:  Negative for chest pain, palpitations and leg swelling.  Gastrointestinal:  Negative for abdominal pain, constipation and heartburn.  Musculoskeletal:  Negative for back pain, joint pain and myalgias.  Skin: Negative.   Neurological:  Negative for dizziness.  Psychiatric/Behavioral:  The patient is not nervous/anxious.    Physical Exam Constitutional:      General: She is not in acute distress.    Appearance: She is well-developed. She is obese. She is not diaphoretic.  Neck:     Thyroid: No thyromegaly.  Cardiovascular:     Rate and Rhythm: Normal rate and regular rhythm.     Pulses: Normal pulses.     Heart sounds: Normal heart sounds.  Pulmonary:     Effort: Pulmonary effort is normal. No respiratory distress.     Breath sounds: Normal breath sounds.  Abdominal:     General: Bowel sounds are normal. There is no distension.     Palpations: Abdomen is soft.     Tenderness: There is no abdominal tenderness.     Comments: Colostomy    Musculoskeletal:        General: Normal range of motion.     Cervical back: Neck supple.     Right lower leg: No edema.     Left lower leg: No  edema.  Lymphadenopathy:     Cervical: No cervical adenopathy.  Skin:    General: Skin is warm and dry.  Neurological:     Mental Status: She is alert. Mental status is at baseline.     Comments:  02-09-23: SLUMS: 17/30  Psychiatric:        Mood and Affect: Mood normal.    ASSESSMENT/ PLAN:  TODAY  Degenerative disc disease, lumbar: will continue tylenol 500 mg nightly   2. Moderate persistent asthma in adult: is on breo ellipta 200/5 mcg 1 puff daily   3. Macrocytic anemia: hgb 12.2    PREVIOUS   4. Post menopausal osteoporosis: t score -3.253 will monitor   5. Vitamin B 12 deficiency: level 446; is on monthly injections   6. Bilateral lower extremity edema; is on lasix 20 mg daily with k+ 40 meq twice daily   7. Vascular dementia without behavioral disturbance: weight is 172 pounds,   8. Non-seasonal allergic rhinitis unspecified trigger: will continue claritin 10 mg daily  9. Essential hypertension: 115/67 will continue tenormin 50 mg daily   10. Hypokalemia: k+ 3.7 will continue k+ 40 meq twice daily   11. Major depression recurrent chronic: will continue melatonin nightly for sleep  12. Aortoiliac atherosclerosis (ct 08-21-20) is not on statin due to advanced age.   13. History of large bowel obstruction is status post colostomy    Synthia Innocent NP [facility]  call [phone]

## 2023-08-03 DIAGNOSIS — L602 Onychogryphosis: Secondary | ICD-10-CM | POA: Diagnosis not present

## 2023-08-03 DIAGNOSIS — L603 Nail dystrophy: Secondary | ICD-10-CM | POA: Diagnosis not present

## 2023-08-03 DIAGNOSIS — I739 Peripheral vascular disease, unspecified: Secondary | ICD-10-CM | POA: Diagnosis not present

## 2023-08-10 ENCOUNTER — Non-Acute Institutional Stay (SKILLED_NURSING_FACILITY): Payer: Self-pay | Admitting: Internal Medicine

## 2023-08-10 ENCOUNTER — Encounter: Payer: Self-pay | Admitting: Internal Medicine

## 2023-08-10 DIAGNOSIS — F339 Major depressive disorder, recurrent, unspecified: Secondary | ICD-10-CM | POA: Diagnosis not present

## 2023-08-10 DIAGNOSIS — F0150 Vascular dementia without behavioral disturbance: Secondary | ICD-10-CM

## 2023-08-10 DIAGNOSIS — I700 Atherosclerosis of aorta: Secondary | ICD-10-CM

## 2023-08-10 DIAGNOSIS — E538 Deficiency of other specified B group vitamins: Secondary | ICD-10-CM

## 2023-08-10 DIAGNOSIS — K219 Gastro-esophageal reflux disease without esophagitis: Secondary | ICD-10-CM

## 2023-08-10 DIAGNOSIS — I708 Atherosclerosis of other arteries: Secondary | ICD-10-CM

## 2023-08-10 NOTE — Assessment & Plan Note (Signed)
Macrocytosis persists with an MCV of 102.7 but B12 level is therapeutic at 328 on 1000 mcg supplement daily.

## 2023-08-10 NOTE — Assessment & Plan Note (Signed)
She denies any anginal equivalent; but she is essentially sedentary, mobilizing in wheelchair using her legs.

## 2023-08-10 NOTE — Progress Notes (Unsigned)
NURSING HOME LOCATION:  [facility] ROOM NUMBER:  [id number]  CODE STATUS:  DNR  PCP:  Synthia Innocent NP  This is a nursing facility follow up visit of chronic medical diagnoses & to document compliance with Regulation 483.30 (c) in The Long Term Care Survey Manual Phase 2 which mandates caregiver visit ( visits can alternate among physician, PA or NP as per statutes) within 10 days of 30 days / 60 days/ 90 days post admission to SNF date    Interim medical record and care since last SNF visit was updated with review of diagnostic studies and change in clinical status since last visit were documented.  HPI: She is a permanent resident of this facility with medical diagnoses of history of asthma, history of breast cancer, history of diverticulosis, GERD, essential hypertension, OSA, thyroid dysfunction, and history of anxiety/depression. Significant surgeries include left breast surgery for cancer in 2001, thyroid surgery in 1983, and ostomy placement. Most recent labs were performed 01/29/2023 and revealed slight progression of hypocalcemia with a value of 8.6, down from 8.9.  Albumin and total protein is also decreased slightly with values of 3.1 and 6, down from 3.2 and 6.1.  No anemia was present but she has had persistent macrocytosis with the most recent MCV of 102.7.  B12 level was 328 on 1000 mcg supplement daily ; normal 180 up to 914.   Review of systems: Her only complaint is that her false teeth are "wearing out and she has requested her son help her obtain new dentures.  She did state that it she believes she had had thyroid surgery in the remote past.  She also pointed out her ostomy bag.  Constitutional: No fever, significant weight change, fatigue  Eyes: No redness, discharge, pain, vision change ENT/mouth: No nasal congestion,  purulent discharge, earache, change in hearing, sore throat  Cardiovascular: No chest pain, palpitations, paroxysmal nocturnal dyspnea,  claudication, edema  Respiratory: No cough, sputum production, hemoptysis, DOE, significant snoring, apnea   Gastrointestinal: No heartburn, dysphagia, abdominal pain, nausea /vomiting, rectal bleeding, melena, change in bowels Genitourinary: No dysuria, hematuria, pyuria, incontinence, nocturia Musculoskeletal: No joint stiffness, joint swelling, weakness, pain Dermatologic: No rash, pruritus, change in appearance of skin Neurologic: No dizziness, headache, syncope, seizures, numbness, tingling Psychiatric: No significant anxiety, depression, insomnia, anorexia Endocrine: No change in hair/skin/nails, excessive thirst, excessive hunger, excessive urination  Hematologic/lymphatic: No significant bruising, lymphadenopathy, abnormal bleeding Allergy/immunology: No itchy/watery eyes, significant sneezing, urticaria, angioedema  Physical exam:  Pertinent or positive findings: She appears younger than her stated age.  She mobilizes in the wheelchair using her feet.  She is markedly hard of hearing.  She has complete dentures.  Second heart sound is increased.  Central obesity is present.  Ostomy bag is present.  Pedal pulses are decreased.  She has DIP osteoarthritic changes.  General appearance: Adequately nourished; no acute distress, increased work of breathing is present.   Lymphatic: No lymphadenopathy about the head, neck, axilla. Eyes: No conjunctival inflammation or lid edema is present. There is no scleral icterus. Ears:  External ear exam shows no significant lesions or deformities.   Nose:  External nasal examination shows no deformity or inflammation. Nasal mucosa are pink and moist without lesions, exudates Oral exam:  Lips and gums are healthy appearing. There is no oropharyngeal erythema or exudate. Neck:  No thyromegaly, masses, tenderness noted.    Heart:  Normal rate and regular rhythm. S1 and S2 normal without gallop, murmur, click,  rub .  Lungs: Chest clear to auscultation  without wheezes, rhonchi, rales, rubs. Abdomen: Bowel sounds are normal. Abdomen is soft and nontender with no organomegaly, hernias, masses. GU: Deferred  Extremities:  No cyanosis, clubbing, edema  Neurologic exam : Cn 2-7 intact Strength equal  in upper & lower extremities Balance, Rhomberg, finger to nose testing could not be completed due to clinical state Deep tendon reflexes are equal Skin: Warm & dry w/o tenting. No significant lesions or rash.  See summary under each active problem in the Problem List with associated updated therapeutic plan

## 2023-08-10 NOTE — Patient Instructions (Signed)
See assessment and plan under each diagnosis in the problem list and acutely for this visit 

## 2023-08-10 NOTE — Assessment & Plan Note (Signed)
She is pleasant and interactive.  No behavioral issues reported.  She was in the process of completing a complicated puzzle when I interviewed and examined her.

## 2023-08-10 NOTE — Assessment & Plan Note (Signed)
She denies any active GI symptoms.  No change indicated.

## 2023-08-10 NOTE — Assessment & Plan Note (Signed)
Clinically she is not depressed; she is interactive and communicative.  She denies active anxiety or depression.  She accepts that she will remain a permanent resident here.

## 2023-08-17 ENCOUNTER — Encounter: Payer: Self-pay | Admitting: Adult Health

## 2023-08-17 ENCOUNTER — Non-Acute Institutional Stay (SKILLED_NURSING_FACILITY): Payer: Medicare Other | Admitting: Adult Health

## 2023-08-17 DIAGNOSIS — I708 Atherosclerosis of other arteries: Secondary | ICD-10-CM

## 2023-08-17 DIAGNOSIS — I700 Atherosclerosis of aorta: Secondary | ICD-10-CM

## 2023-08-17 DIAGNOSIS — K56609 Unspecified intestinal obstruction, unspecified as to partial versus complete obstruction: Secondary | ICD-10-CM

## 2023-08-17 DIAGNOSIS — F0150 Vascular dementia without behavioral disturbance: Secondary | ICD-10-CM

## 2023-08-17 NOTE — Progress Notes (Signed)
Location:  [facility] Room Number: 144 Place of Service:  SNF (31)   CODE STATUS: DNR  No Known Allergies  Chief Complaint  Patient presents with   Acute Visit    Care Planning Meeting    HPI:  We have come together for her care plan meeting. BIMS 15/15 mood 0/30. Uses wheelchair without falls. She requires setup to supervision for her adl care. She has a colostomy and is frequently incontinent of bladder. Dietary: independent with meals; regular diet good appetite; weight is 169 pounds. Therapy: none at this time.  She will continue to be followed for her chronic illnesses including:   Aorto-iliac atherosclerosis   Large bowel obstruction   Vascular dementia without behavioral disturbance  Past Medical History:  Diagnosis Date   Anxiety    Arthritis    Asthma    Cancer (HCC)    breast   Depression    Diverticulosis    GERD (gastroesophageal reflux disease)    HTN (hypertension)    Shortness of breath    exertion   Sleep apnea    STOP BANG 4   Thyroid dysfunction     Past Surgical History:  Procedure Laterality Date   ABDOMINAL HYSTERECTOMY     arthroscopic surgery rt knee  1997   BREAST SURGERY  2001   LEFT/ CANCER    broken radius ulna  1990   left   broken right ankle  1976   EXPLORATORY LAPAROTOMY  08/24/2020   with partial sigmoidectomy creat of end colostomy and application of negative pressure dressing.      JOINT REPLACEMENT  right knee 2005 [facility]   left knee  2004   rt foot bone spur removed  1992   THYROID SURGERY  1983   TOTAL KNEE ARTHROPLASTY  01/22/2012   Procedure: TOTAL KNEE ARTHROPLASTY;  Surgeon: Vickki Hearing, MD;  Location: [facility];  Service: Orthopedics;  Laterality: Left;   TUBAL LIGATION      Social History   Socioeconomic History   Marital status: Widowed    Spouse name: Not on file   Number of children: Not on file   Years of education: Not on file   Highest education level: Not on file   Occupational History   Not on file  Tobacco Use   Smoking status: Former   Smokeless tobacco: Former  Advertising account planner   Vaping status: Never Used  Substance and Sexual Activity   Alcohol use: No   Drug use: No   Sexual activity: Never  Other Topics Concern   Not on file  Social History Narrative   Not on file   Social Determinants of Health   Financial Resource Strain: Not on file  Food Insecurity: Not on file  Transportation Needs: Not on file  Physical Activity: Not on file  Stress: Not on file  Social Connections: Not on file  Intimate Partner Violence: Not on file   Family History  Problem Relation Age of Onset   Pseudochol deficiency Neg Hx    Malignant hyperthermia Neg Hx    Hypotension Neg Hx    Anesthesia problems Neg Hx       VITAL SIGNS BP 129/67   Pulse 70   Temp (!) 97.2 F (36.2 C)   Resp 20   Ht 4\' 9"  (1.448 m)   Wt 168 lb 9.6 oz (76.5 kg)   SpO2 96%   BMI 36.48 kg/m   Outpatient Encounter Medications as of 08/17/2023  Medication Sig   acetaminophen (TYLENOL) 500 MG tablet Take 500 mg by mouth every 8 (eight) hours as needed.   acetaminophen (TYLENOL) 500 MG tablet Take 500 mg by mouth at bedtime.   atenolol (TENORMIN) 50 MG tablet Take 50 mg by mouth every evening.   BREO ELLIPTA 200-25 MCG/INH AEPB Inhale 1 puff into the lungs daily.   Calcium Carb-Cholecalciferol (CALCIUM 500 + D PO) Take 1 tablet by mouth daily.   Cholecalciferol (VITAMIN D3) 50 MCG (2000 UT) TABS Take 1 tablet by mouth daily.   Cyanocobalamin 1000 MCG/ML KIT Inject as directed. Once A Day on the 10th of the Month   furosemide (LASIX) 20 MG tablet Take 20 mg by mouth daily.   loratadine (CLARITIN) 10 MG tablet Take 10 mg by mouth daily.   melatonin 5 MG TABS Take 5 mg by mouth.   Multiple Vitamins-Minerals (PRESERVISION AREDS 2) CAPS Take 1 capsule by mouth in the morning and at bedtime.   NON FORMULARY Diet:Regular   potassium chloride SA (KLOR-CON) 20 MEQ tablet Take 40  mEq by mouth 2 (two) times daily.   nystatin (MYCOSTATIN/NYSTOP) powder Apply 1 application topically daily as needed. Special Instructions: Apply thin layer of Nystatin powder to skin around stoma when changing colostomy bag PRN irritation. (Patient not taking: Reported on 08/17/2023)   No facility-administered encounter medications on file as of 08/17/2023.     SIGNIFICANT DIAGNOSTIC EXAMS  PREVIOUS   03-02-21: DEXA: t score -3.258  NO NEW LABS.   LABS REVIEWED PREVIOUS    08-10-22: tsh 1.815 10-23-22: wbc 5.3; hgb 12.3; hct 37.5; mcv 102.5 plt 189; glucose 82; bun 14; creat 0.71; k+ 4.0; na++ 140; ca 8.9; gfr >60; protein 6.1 albumin 3.2  12-27-12: vitamin B 12: 328 01-29-23: wb 5.4; hgb 12.2; hct 37.5; mcv 102.7 plt 207; glucose 76; bun 15; creat 0.73; k+ 3.7; na++ 140; ca 8.6; gfr >60 protein 6.0 albumin 3.1; tsh 1.955  NO NEW LABS.   Review of Systems  Constitutional:  Negative for malaise/fatigue.  Respiratory:  Negative for cough and shortness of breath.   Cardiovascular:  Negative for chest pain, palpitations and leg swelling.  Gastrointestinal:  Negative for abdominal pain, constipation and heartburn.  Musculoskeletal:  Negative for back pain, joint pain and myalgias.  Skin: Negative.   Neurological:  Negative for dizziness.  Psychiatric/Behavioral:  The patient is not nervous/anxious.    Physical Exam Constitutional:      General: She is not in acute distress.    Appearance: She is well-developed. She is obese. She is not diaphoretic.  Neck:     Thyroid: No thyromegaly.  Cardiovascular:     Rate and Rhythm: Normal rate and regular rhythm.     Pulses: Normal pulses.     Heart sounds: Normal heart sounds.  Pulmonary:     Effort: Pulmonary effort is normal. No respiratory distress.     Breath sounds: Normal breath sounds.  Abdominal:     General: Bowel sounds are normal. There is no distension.     Palpations: Abdomen is soft.     Tenderness: There is no abdominal  tenderness.     Comments: Colostomy   Musculoskeletal:        General: Normal range of motion.     Cervical back: Neck supple.     Right lower leg: No edema.     Left lower leg: No edema.  Lymphadenopathy:     Cervical: No cervical adenopathy.  Skin:  General: Skin is warm and dry.  Neurological:     Mental Status: She is alert. Mental status is at baseline.     Comments: 02-09-23: SLUMS: 17/30   Psychiatric:        Mood and Affect: Mood normal.      ASSESSMENT/ PLAN:  TODAY  Aorto-iliac atherosclerosis Large bowel obstruction Vascular dementia without behavioral disturbance  Will continue current medications Will continue current plan of care Will continue to monitor her status.   Time spent with patient: 40 minutes: medications; plan of care; activities.    Synthia Innocent NP [facility]  call [phone]  ]

## 2023-09-04 ENCOUNTER — Non-Acute Institutional Stay (SKILLED_NURSING_FACILITY): Payer: Self-pay | Admitting: Adult Health

## 2023-09-04 ENCOUNTER — Encounter: Payer: Self-pay | Admitting: Adult Health

## 2023-09-04 DIAGNOSIS — R600 Localized edema: Secondary | ICD-10-CM | POA: Diagnosis not present

## 2023-09-04 DIAGNOSIS — M810 Age-related osteoporosis without current pathological fracture: Secondary | ICD-10-CM

## 2023-09-04 DIAGNOSIS — E538 Deficiency of other specified B group vitamins: Secondary | ICD-10-CM

## 2023-09-04 NOTE — Progress Notes (Signed)
Location:  [facility] Room Number: 144 Place of Service:  SNF (31)   CODE STATUS: dnr   No Known Allergies  Chief Complaint  Patient presents with   Medical Management of Chronic Issues         Post menopausal osteoporosis  vitamin B 12 deficiency:  Bilateral lower extremity edema:     HPI:  She is a 89 year old long term resident of this facility being seen for the management of her chronic illnesses:Post menopausal osteoporosis  vitamin B 12 deficiency:  Bilateral lower extremity edema. Her edema is well controlled with her current regimen. There are no reports of uncontrolled pain. She does get out of bed daily.   Past Medical History:  Diagnosis Date   Anxiety    Arthritis    Asthma    Cancer (HCC)    breast   Depression    Diverticulosis    GERD (gastroesophageal reflux disease)    HTN (hypertension)    Shortness of breath    exertion   Sleep apnea    STOP BANG 4   Thyroid dysfunction     Past Surgical History:  Procedure Laterality Date   ABDOMINAL HYSTERECTOMY     arthroscopic surgery rt knee  1997   BREAST SURGERY  2001   LEFT/ CANCER    broken radius ulna  1990   left   broken right ankle  1976   EXPLORATORY LAPAROTOMY  08/24/2020   with partial sigmoidectomy creat of end colostomy and application of negative pressure dressing.      JOINT REPLACEMENT  right knee 2005 [place]   left knee  2004   rt foot bone spur removed  1992   THYROID SURGERY  1983   TOTAL KNEE ARTHROPLASTY  01/22/2012   Procedure: TOTAL KNEE ARTHROPLASTY;  Surgeon: Vickki Hearing, MD;  Location: [facility];  Service: Orthopedics;  Laterality: Left;   TUBAL LIGATION      Social History   Socioeconomic History   Marital status: Widowed    Spouse name: Not on file   Number of children: Not on file   Years of education: Not on file   Highest education level: Not on file  Occupational History   Not on file  Tobacco Use   Smoking status: Former    Smokeless tobacco: Former  Advertising account planner   Vaping status: Never Used  Substance and Sexual Activity   Alcohol use: No   Drug use: No   Sexual activity: Never  Other Topics Concern   Not on file  Social History Narrative   Not on file   Social Determinants of Health   Financial Resource Strain: Not on file  Food Insecurity: Not on file  Transportation Needs: Not on file  Physical Activity: Not on file  Stress: Not on file  Social Connections: Not on file  Intimate Partner Violence: Not on file   Family History  Problem Relation Age of Onset   Pseudochol deficiency Neg Hx    Malignant hyperthermia Neg Hx    Hypotension Neg Hx    Anesthesia problems Neg Hx       VITAL SIGNS BP (!) 133/54   Pulse 72   Temp (!) 96.8 F (36 C)   Resp 20   Ht 4\' 9"  (1.448 m)   Wt 172 lb (78 kg)   SpO2 94%   BMI 37.22 kg/m   Outpatient Encounter Medications as of 09/04/2023  Medication Sig  acetaminophen (TYLENOL) 500 MG tablet Take 500 mg by mouth every 8 (eight) hours as needed.   acetaminophen (TYLENOL) 500 MG tablet Take 500 mg by mouth at bedtime.   atenolol (TENORMIN) 50 MG tablet Take 50 mg by mouth every evening.   BREO ELLIPTA 200-25 MCG/INH AEPB Inhale 1 puff into the lungs daily.   Calcium Carb-Cholecalciferol (CALCIUM 500 + D PO) Take 1 tablet by mouth daily.   Cholecalciferol (VITAMIN D3) 50 MCG (2000 UT) TABS Take 1 tablet by mouth daily.   Cyanocobalamin 1000 MCG/ML KIT Inject as directed. Once A Day on the 10th of the Month   furosemide (LASIX) 20 MG tablet Take 20 mg by mouth daily.   loratadine (CLARITIN) 10 MG tablet Take 10 mg by mouth daily.   melatonin 5 MG TABS Take 5 mg by mouth.   Multiple Vitamins-Minerals (PRESERVISION AREDS 2) CAPS Take 1 capsule by mouth in the morning and at bedtime.   NON FORMULARY Diet:Regular   nystatin (MYCOSTATIN/NYSTOP) powder Apply 1 application topically daily as needed. Special Instructions: Apply thin layer of Nystatin powder to  skin around stoma when changing colostomy bag PRN irritation. (Patient not taking: Reported on 08/17/2023)   potassium chloride SA (KLOR-CON) 20 MEQ tablet Take 40 mEq by mouth 2 (two) times daily.   No facility-administered encounter medications on file as of 09/04/2023.     SIGNIFICANT DIAGNOSTIC EXAMS  PREVIOUS   03-02-21: DEXA: t score -3.258  NO NEW LABS.   LABS REVIEWED PREVIOUS    10-23-22: wbc 5.3; hgb 12.3; hct 37.5; mcv 102.5 plt 189; glucose 82; bun 14; creat 0.71; k+ 4.0; na++ 140; ca 8.9; gfr >60; protein 6.1 albumin 3.2  12-27-12: vitamin B 12: 328 01-29-23: wb 5.4; hgb 12.2; hct 37.5; mcv 102.7 plt 207; glucose 76; bun 15; creat 0.73; k+ 3.7; na++ 140; ca 8.6; gfr >60 protein 6.0 albumin 3.1; tsh 1.955  NO NEW LABS.   Review of Systems  Constitutional:  Negative for malaise/fatigue.  Respiratory:  Negative for cough and shortness of breath.   Cardiovascular:  Negative for chest pain, palpitations and leg swelling.  Gastrointestinal:  Negative for abdominal pain, constipation and heartburn.  Musculoskeletal:  Negative for back pain, joint pain and myalgias.  Skin: Negative.   Neurological:  Negative for dizziness.  Psychiatric/Behavioral:  The patient is not nervous/anxious.    Physical Exam Constitutional:      General: She is not in acute distress.    Appearance: She is well-developed. She is obese. She is not diaphoretic.  Neck:     Thyroid: No thyromegaly.  Cardiovascular:     Rate and Rhythm: Normal rate and regular rhythm.     Pulses: Normal pulses.     Heart sounds: Normal heart sounds.  Pulmonary:     Effort: Pulmonary effort is normal. No respiratory distress.     Breath sounds: Normal breath sounds.  Abdominal:     General: Bowel sounds are normal. There is no distension.     Palpations: Abdomen is soft.     Tenderness: There is no abdominal tenderness.     Comments: colostomy  Musculoskeletal:        General: Normal range of motion.     Cervical  back: Neck supple.     Right lower leg: No edema.     Left lower leg: No edema.  Lymphadenopathy:     Cervical: No cervical adenopathy.  Skin:    General: Skin is warm and dry.  Neurological:     Mental Status: She is alert. Mental status is at baseline.     Comments: 02-09-23: SLUMS: 17/30   Psychiatric:        Mood and Affect: Mood normal.     ASSESSMENT/ PLAN:  TODAY  Post menopausal osteoporosis t score -3.253  2. vitamin B 12 deficiency: level 328 on monthly injections  3. Bilateral lower extremity edema: is on lasix 20 mg daily with k+ 40 meq twice daily    PREVIOUS   4. Vascular dementia without behavioral disturbance: weight is 172 pounds,   5. Non-seasonal allergic rhinitis unspecified trigger: will continue claritin 10 mg daily  6. Essential hypertension: 133/54 will continue tenormin 50 mg daily   7. Hypokalemia: k+ 3.7 will continue k+ 40 meq twice daily   8. Major depression recurrent chronic: will continue melatonin nightly for sleep  9. Aortoiliac atherosclerosis (ct 08-21-20) is not on statin due to advanced age.   10. History of large bowel obstruction is status post colostomy   11. Degenerative disc disease, lumbar: will continue tylenol 500 mg nightly   12. Moderate persistent asthma in adult: is on breo ellipta 200/5 mcg 1 puff daily   13. Macrocytic anemia: hgb 12.2    Will check cbc; cmp; tsh vitamin B12       Synthia Innocent NP [facility]  call [phone]

## 2023-09-06 ENCOUNTER — Other Ambulatory Visit (HOSPITAL_COMMUNITY)
Admission: RE | Admit: 2023-09-06 | Discharge: 2023-09-06 | Disposition: A | Discharge status: Home / Self Care | Payer: Medicare Other | Source: Skilled Nursing Facility | Attending: Adult Health | Admitting: Adult Health

## 2023-09-06 DIAGNOSIS — I129 Hypertensive chronic kidney disease with stage 1 through stage 4 chronic kidney disease, or unspecified chronic kidney disease: Secondary | ICD-10-CM | POA: Insufficient documentation

## 2023-09-06 LAB — COMPREHENSIVE METABOLIC PANEL
ALT: 9 U/L (ref 0–44)
AST: 18 U/L (ref 15–41)
Albumin: 3 g/dL — ABNORMAL LOW (ref 3.5–5.0)
Alkaline Phosphatase: 49 U/L (ref 38–126)
Anion gap: 7 (ref 5–15)
BUN: 13 mg/dL (ref 8–23)
CO2: 22 mmol/L (ref 22–32)
Calcium: 8.6 mg/dL — ABNORMAL LOW (ref 8.9–10.3)
Chloride: 109 mmol/L (ref 98–111)
Creatinine, Ser: 0.61 mg/dL (ref 0.44–1.00)
GFR, Estimated: >60 mL/min (ref >60)
Glucose, Bld: 75 mg/dL (ref 70–99)
Potassium: 4.5 mmol/L (ref 3.5–5.1)
Sodium: 138 mmol/L (ref 135–145)
Total Bilirubin: 0.6 mg/dL (ref <1.2)
Total Protein: 5.7 g/dL — ABNORMAL LOW (ref 6.5–8.1)

## 2023-09-06 LAB — CBC
HCT: 37.6 % (ref 36.0–46.0)
Hemoglobin: 12 g/dL (ref 12.0–15.0)
MCH: 33.2 pg (ref 26.0–34.0)
MCHC: 31.9 g/dL (ref 30.0–36.0)
MCV: 104.2 fL — ABNORMAL HIGH (ref 80.0–100.0)
Platelets: 191 10*3/uL (ref 150–400)
RBC: 3.61 MIL/uL — ABNORMAL LOW (ref 3.87–5.11)
RDW: 12.3 % (ref 11.5–15.5)
WBC: 4 10*3/uL (ref 4.0–10.5)
nRBC: 0 % (ref 0.0–0.2)

## 2023-09-06 LAB — VITAMIN B12: Vitamin B-12: 341 pg/mL (ref 180–914)

## 2023-09-06 LAB — TSH: TSH: 1.917 u[IU]/mL (ref 0.350–4.500)

## 2023-10-08 ENCOUNTER — Encounter: Payer: Self-pay | Admitting: Adult Health

## 2023-10-08 ENCOUNTER — Non-Acute Institutional Stay (SKILLED_NURSING_FACILITY): Payer: Self-pay | Admitting: Adult Health

## 2023-10-08 DIAGNOSIS — F0150 Vascular dementia without behavioral disturbance: Secondary | ICD-10-CM | POA: Diagnosis not present

## 2023-10-08 DIAGNOSIS — I129 Hypertensive chronic kidney disease with stage 1 through stage 4 chronic kidney disease, or unspecified chronic kidney disease: Secondary | ICD-10-CM

## 2023-10-08 DIAGNOSIS — N182 Chronic kidney disease, stage 2 (mild): Secondary | ICD-10-CM | POA: Diagnosis not present

## 2023-10-08 DIAGNOSIS — J3089 Other allergic rhinitis: Secondary | ICD-10-CM | POA: Diagnosis not present

## 2023-10-08 NOTE — Progress Notes (Signed)
 Location:  [facility] Room Number: [id number] Place of Service:  SNF (31)   CODE STATUS: dnr   No Known Allergies  Chief Complaint  Patient presents with   Medical Management of Chronic Issues            Vascular dementia without behavioral disturbance:    Non-seasonal allergic rhinitis unspecified trigger:  Essential hypertension:     HPI:  She is a 89 year old long term resident of this facility being seen for the management of her chronic illnesses: Vascular dementia without behavioral disturbance:    Non-seasonal allergic rhinitis unspecified trigger:  Essential hypertension. There are no reports of uncontrolled pain. Her weight is stable. There are no reports of anxiety or agitation.   Past Medical History:  Diagnosis Date   Anxiety    Arthritis    Asthma    Cancer (HCC)    breast   Depression    Diverticulosis    GERD (gastroesophageal reflux disease)    HTN (hypertension)    Shortness of breath    exertion   Sleep apnea    STOP BANG 4   Thyroid  dysfunction     Past Surgical History:  Procedure Laterality Date   ABDOMINAL HYSTERECTOMY     arthroscopic surgery rt knee  1997   BREAST SURGERY  2001   LEFT/ CANCER    broken radius ulna  1990   left   broken right ankle  1976   EXPLORATORY LAPAROTOMY  08/24/2020   with partial sigmoidectomy creat of end colostomy and application of negative pressure dressing.      JOINT REPLACEMENT  right knee 2005 [place]   left knee  2004   rt foot bone spur removed  1992   THYROID  SURGERY  1983   TOTAL KNEE ARTHROPLASTY  01/22/2012   Procedure: TOTAL KNEE ARTHROPLASTY;  Surgeon: Taft FORBES Minerva, MD;  Location: [facility];  Service: Orthopedics;  Laterality: Left;   TUBAL LIGATION      Social History   Socioeconomic History   Marital status: Widowed    Spouse name: Not on file   Number of children: Not on file   Years of education: Not on file   Highest education level: Not on file  Occupational  History   Not on file  Tobacco Use   Smoking status: Former   Smokeless tobacco: Former  Advertising Account Planner   Vaping status: Never Used  Substance and Sexual Activity   Alcohol use: No   Drug use: No   Sexual activity: Never  Other Topics Concern   Not on file  Social History Narrative   Not on file   Social Drivers of Health   Financial Resource Strain: Not on file  Food Insecurity: Not on file  Transportation Needs: Not on file  Physical Activity: Not on file  Stress: Not on file  Social Connections: Not on file  Intimate Partner Violence: Not on file   Family History  Problem Relation Age of Onset   Pseudochol deficiency Neg Hx    Malignant hyperthermia Neg Hx    Hypotension Neg Hx    Anesthesia problems Neg Hx       VITAL SIGNS BP 123/65   Pulse 73   Temp 97.6 F (36.4 C)   Resp 20   Ht 4' 9 (1.448 m)   Wt 170 lb 6.4 oz (77.3 kg)   SpO2 98%   BMI 36.87 kg/m   Outpatient Encounter Medications as  of 10/08/2023  Medication Sig   acetaminophen  (TYLENOL ) 500 MG tablet Take 500 mg by mouth every 8 (eight) hours as needed.   acetaminophen  (TYLENOL ) 500 MG tablet Take 500 mg by mouth at bedtime.   atenolol  (TENORMIN ) 50 MG tablet Take 50 mg by mouth every evening.   BREO ELLIPTA  200-25 MCG/INH AEPB Inhale 1 puff into the lungs daily.   Calcium Carb-Cholecalciferol  (CALCIUM 500 + D PO) Take 1 tablet by mouth daily.   Cholecalciferol  (VITAMIN D3) 50 MCG (2000 UT) TABS Take 1 tablet by mouth daily.   Cyanocobalamin  1000 MCG/ML KIT Inject as directed. Once A Day on the 10th of the Month   furosemide (LASIX) 20 MG tablet Take 20 mg by mouth daily.   loratadine  (CLARITIN ) 10 MG tablet Take 10 mg by mouth daily.   melatonin 5 MG TABS Take 5 mg by mouth.   Multiple Vitamins-Minerals (PRESERVISION AREDS 2) CAPS Take 1 capsule by mouth in the morning and at bedtime.   NON FORMULARY Diet:Regular   nystatin (MYCOSTATIN/NYSTOP) powder Apply 1 application topically daily as  needed. Special Instructions: Apply thin layer of Nystatin powder to skin around stoma when changing colostomy bag PRN irritation. (Patient not taking: Reported on 08/17/2023)   potassium chloride  SA (KLOR-CON ) 20 MEQ tablet Take 40 mEq by mouth 2 (two) times daily.   No facility-administered encounter medications on file as of 10/08/2023.     SIGNIFICANT DIAGNOSTIC EXAMS  PREVIOUS   03-02-21: DEXA: t score -3.258  NO NEW LABS.   LABS REVIEWED PREVIOUS    10-23-22: wbc 5.3; hgb 12.3; hct 37.5; mcv 102.5 plt 189; glucose 82; bun 14; creat 0.71; k+ 4.0; na++ 140; ca 8.9; gfr >60; protein 6.1 albumin 3.2  12-27-12: vitamin B 12: 328 01-29-23: wb 5.4; hgb 12.2; hct 37.5; mcv 102.7 plt 207; glucose 76; bun 15; creat 0.73; k+ 3.7; na++ 140; ca 8.6; gfr >60 protein 6.0 albumin 3.1; tsh 1.955  TODAY  09-06-23: wbc 4.0; hgb 12.0; hct 37.6; mcv 104.2 plt 191; glucose 75; bun 13; creat 0.61; k+ 4.5; na++ 138; ca 8.6; gfr >60; protein 5.7; albumin 3.0; tsh 1.917; vitamin B 12: 347  Review of Systems  Constitutional:  Negative for malaise/fatigue.  Respiratory:  Negative for cough and shortness of breath.   Cardiovascular:  Negative for chest pain, palpitations and leg swelling.  Gastrointestinal:  Negative for abdominal pain, constipation and heartburn.  Musculoskeletal:  Negative for back pain, joint pain and myalgias.  Skin: Negative.   Neurological:  Negative for dizziness.  Psychiatric/Behavioral:  The patient is not nervous/anxious.    Physical Exam Constitutional:      General: She is not in acute distress.    Appearance: She is well-developed. She is obese. She is not diaphoretic.  Neck:     Thyroid : No thyromegaly.  Cardiovascular:     Rate and Rhythm: Normal rate and regular rhythm.     Heart sounds: Normal heart sounds.  Pulmonary:     Effort: Pulmonary effort is normal. No respiratory distress.     Breath sounds: Normal breath sounds.  Abdominal:     General: Bowel sounds are  normal. There is no distension.     Palpations: Abdomen is soft.     Tenderness: There is no abdominal tenderness.     Comments: colostomy  Musculoskeletal:        General: Normal range of motion.     Cervical back: Neck supple.     Right lower leg:  No edema.     Left lower leg: No edema.  Lymphadenopathy:     Cervical: No cervical adenopathy.  Skin:    General: Skin is warm and dry.  Neurological:     Mental Status: She is alert. Mental status is at baseline.     Comments:  02-09-23: SLUMS: 17/30    Psychiatric:        Mood and Affect: Mood normal.      ASSESSMENT/ PLAN:  TODAY  Vascular dementia without behavioral disturbance: weight is 170 pounds without significant change  2. Non-seasonal allergic rhinitis unspecified trigger: will continue claritin  10 mg daily  3. Essential hypertension: b/p 123/65 will continue tenormin  50 mg daily   PREVIOUS   4. Hypokalemia: k+ 3.7 will continue k+ 40 meq twice daily   5. Major depression recurrent chronic: will continue melatonin nightly for sleep  6. Aortoiliac atherosclerosis (ct 08-21-20) is not on statin due to advanced age.   7. History of large bowel obstruction is status post colostomy   8. Degenerative disc disease, lumbar: will continue tylenol  500 mg nightly   9. Moderate persistent asthma in adult: is on breo ellipta  200/5 mcg 1 puff daily   10. Macrocytic anemia: hgb 12.2   11. Post menopausal osteoporosis t score -3.253  12. vitamin B 12 deficiency: level 347 on monthly injections  13. Bilateral lower extremity edema: is on lasix 20 mg daily with k+ 40 meq twice daily    Barnie Seip NP [facility]   call [phone]

## 2023-10-25 ENCOUNTER — Non-Acute Institutional Stay (SKILLED_NURSING_FACILITY): Payer: Self-pay | Admitting: Internal Medicine

## 2023-10-25 ENCOUNTER — Encounter: Payer: Self-pay | Admitting: Internal Medicine

## 2023-10-25 DIAGNOSIS — E538 Deficiency of other specified B group vitamins: Secondary | ICD-10-CM

## 2023-10-25 DIAGNOSIS — E440 Moderate protein-calorie malnutrition: Secondary | ICD-10-CM | POA: Diagnosis not present

## 2023-10-25 DIAGNOSIS — Z933 Colostomy status: Secondary | ICD-10-CM

## 2023-10-25 NOTE — Assessment & Plan Note (Addendum)
SNF staff monitors for any associated malfunction or complication; none reported.Marland Kitchen

## 2023-10-25 NOTE — Assessment & Plan Note (Addendum)
Although she is morbidly obese; albumin is 3.0 and total protein 5.7.  Prior values were 3.1 & 6.  She also exhibits macrocytosis with most recent MCV of 104.2.  Her B12 level is therapeutic and stable at 341 on 1000 mcg of B12 daily.  Folate has not been checked since 06/02/2021 when it was 29.1.In view of the progressive protein/caloric malnutrition & persistent macrocytosis; folate will be checked with next blood draw.

## 2023-10-25 NOTE — Assessment & Plan Note (Signed)
B12 level is therapeutic at 341 and stable serially on 1000 mcg of B12 daily.  Despite this; macrocytosis persists with an MCV of 104.2.  In the context of protein/caloric malnutrition; folate will be updated.

## 2023-10-25 NOTE — Assessment & Plan Note (Addendum)
Current albumin is 3.0 down from 3.1 and total protein of 5.7 down from 6.0.  This documents protein/caloric malnutrition despite morbid obesity.  Because of persistent macrocytosis despite therapeutic B12 level; folate level will be checked.Nutritionist will be consulted.

## 2023-10-25 NOTE — Patient Instructions (Signed)
See assessment and plan under each diagnosis in the problem list and acutely for this visit

## 2023-10-25 NOTE — Progress Notes (Signed)
NURSING HOME LOCATION:  [facility] ROOM NUMBER:  [id number]  CODE STATUS:  DNR  PCP:  Synthia Innocent NP  This is a nursing facility follow up visit of chronic medical diagnoses & to document compliance with Regulation 483.30 (c) in The Long Term Care Survey Manual Phase 2 which mandates caregiver visit ( visits can alternate among physician, PA or NP as per statutes) within 10 days of 30 days / 60 days/ 90 days post admission to SNF date    Interim medical record and care since last SNF visit was updated with review of diagnostic studies and change in clinical status since last visit were documented.  HPI: She is a permanent resident of this facility with medical diagnoses of degenerative joint disease, history of asthma, history of anxiety/depression, history of diverticulosis, GERD, essential hypertension, thyroid dysfunction, sleep apnea, and history of breast cancer. Most recent labs reveal stable hypocalcemia with a value of 8.6.  Albumin has dropped from 3.1-3.0 and total protein from 6.0-5.7.  This documents protein/caloric malnutrition despite the presence of morbid obesity.  B12 level is therapeutic at 341 and serially stable.  Despite this; macrocytosis persists with an MCV of 104.2.  There is no associated anemia.  Folate was last checked 06/02/2021 and was 29.1.  TSH is therapeutic at 1.917.  Review of systems: She states that she is doing "okay."  Despite the low albumin and total protein she states that her appetite is good ; % meal intake depends on her food preferences.  Her only active complaint is intermittent "little back pain."  She denies any GI issues or problems with the ostomy.  Constitutional: No fever, significant weight change, fatigue  Eyes: No redness, discharge, pain, vision change ENT/mouth: No nasal congestion,  purulent discharge, earache, change in hearing, sore throat  Cardiovascular: No chest pain, palpitations, paroxysmal nocturnal dyspnea,  edema  Respiratory: No cough, sputum production, hemoptysis, DOE, significant snoring, apnea   Gastrointestinal: No heartburn, dysphagia, abdominal pain, nausea /vomiting, rectal bleeding, melena, change in bowels Genitourinary: No dysuria, hematuria, pyuria, incontinence, nocturia Dermatologic: No rash, pruritus, change in appearance of skin Neurologic: No dizziness, headache, syncope, seizures, numbness, tingling Psychiatric: No significant anxiety, depression, insomnia, anorexia Endocrine: No change in hair/skin/nails, excessive thirst, excessive hunger, excessive urination  Hematologic/lymphatic: No significant bruising, lymphadenopathy, abnormal bleeding Allergy/immunology: No itchy/watery eyes, significant sneezing, urticaria, angioedema  Physical exam:  Pertinent or positive findings: She is profoundly hard of hearing, essentially deaf.  She has complete dentures.  Heart sounds are distant.  Central obesity is present.  Ostomy is present on the left.  There is 1+ edema at the right sock line and trace-1/2+ at the left sock line.  Pedal pulses are decreased but palpable.  General appearance:  no acute distress, increased work of breathing is present.   Lymphatic: No lymphadenopathy about the head, neck, axilla. Eyes: No conjunctival inflammation or lid edema is present. There is no scleral icterus. Ears:  External ear exam shows no significant lesions or deformities.   Nose:  External nasal examination shows no deformity or inflammation. Nasal mucosa are pink and moist without lesions, exudates Neck:  No thyromegaly, masses, tenderness noted.    Heart:  No gallop, murmur, click, rub .  Lungs: without wheezes, rhonchi, rales, rubs. Abdomen: Bowel sounds are normal. Abdomen is soft and nontender with no organomegaly, hernias, masses. GU: Deferred  Extremities:  No cyanosis, clubbing  Neurologic exam :Balance, Rhomberg, finger to nose testing could not  be completed due to clinical  state Skin: Warm & dry w/o tenting. No significant lesions or rash.  See summary under each active problem in the Problem List with associated updated therapeutic plan

## 2023-10-26 ENCOUNTER — Other Ambulatory Visit (HOSPITAL_COMMUNITY)
Admission: RE | Admit: 2023-10-26 | Discharge: 2023-10-26 | Disposition: A | Discharge status: Home / Self Care | Payer: Medicare Other | Source: Skilled Nursing Facility | Attending: Internal Medicine | Admitting: Internal Medicine

## 2023-10-26 DIAGNOSIS — D529 Folate deficiency anemia, unspecified: Secondary | ICD-10-CM | POA: Insufficient documentation

## 2023-10-26 LAB — FOLATE: Folate: 3.3 ng/mL — ABNORMAL LOW (ref >5.9)

## 2023-11-09 DIAGNOSIS — L603 Nail dystrophy: Secondary | ICD-10-CM | POA: Diagnosis not present

## 2023-11-09 DIAGNOSIS — I739 Peripheral vascular disease, unspecified: Secondary | ICD-10-CM | POA: Diagnosis not present

## 2023-11-09 DIAGNOSIS — L602 Onychogryphosis: Secondary | ICD-10-CM | POA: Diagnosis not present

## 2023-11-16 ENCOUNTER — Encounter: Payer: Self-pay | Admitting: Adult Health

## 2023-11-16 ENCOUNTER — Non-Acute Institutional Stay (SKILLED_NURSING_FACILITY): Payer: Medicare Other | Admitting: Adult Health

## 2023-11-16 DIAGNOSIS — I708 Atherosclerosis of other arteries: Secondary | ICD-10-CM

## 2023-11-16 DIAGNOSIS — I700 Atherosclerosis of aorta: Secondary | ICD-10-CM

## 2023-11-16 DIAGNOSIS — Z933 Colostomy status: Secondary | ICD-10-CM

## 2023-11-16 DIAGNOSIS — K56609 Unspecified intestinal obstruction, unspecified as to partial versus complete obstruction: Secondary | ICD-10-CM

## 2023-11-16 NOTE — Progress Notes (Signed)
 Location:  [facility] Room Number: 143 Place of Service:  SNF (31)   CODE STATUS: dnr  No Known Allergies  Chief Complaint  Patient presents with   Acute Visit    Care plan meeting    HPI:  We have come together for her care plan meeting. BIMS 15/15 mood 0/30. She uses a wheelchair without falls. She requires supervision to mod assist with her adl care. She has a colostomy and is frequently incontinent of bladder. Dietary: regular diet feeds self; appetite 50-100%; weight is 171.8 pounds. No therapy as this time. She will continue to be followed for her chronic illnesses including:  Aorto-iliac atherosclerosis  Large bowel obstruction    Colostomy status  Past Medical History:  Diagnosis Date   Anxiety    Arthritis    Asthma    Cancer (HCC)    breast   Depression    Diverticulosis    GERD (gastroesophageal reflux disease)    HTN (hypertension)    Shortness of breath    exertion   Sleep apnea    STOP BANG 4   Thyroid dysfunction     Past Surgical History:  Procedure Laterality Date   ABDOMINAL HYSTERECTOMY     arthroscopic surgery rt knee  1997   BREAST SURGERY  2001   LEFT/ CANCER    broken radius ulna  1990   left   broken right ankle  1976   EXPLORATORY LAPAROTOMY  08/24/2020   with partial sigmoidectomy creat of end colostomy and application of negative pressure dressing.      JOINT REPLACEMENT  right knee 2005 [place]   left knee  2004   rt foot bone spur removed  1992   THYROID SURGERY  1983   TOTAL KNEE ARTHROPLASTY  01/22/2012   Procedure: TOTAL KNEE ARTHROPLASTY;  Surgeon: Vickki Hearing, MD;  Location: [facility];  Service: Orthopedics;  Laterality: Left;   TUBAL LIGATION      Social History   Socioeconomic History   Marital status: Widowed    Spouse name: Not on file   Number of children: Not on file   Years of education: Not on file   Highest education level: Not on file  Occupational History   Not on file   Tobacco Use   Smoking status: Former   Smokeless tobacco: Former  Advertising account planner   Vaping status: Never Used  Substance and Sexual Activity   Alcohol use: No   Drug use: No   Sexual activity: Never  Other Topics Concern   Not on file  Social History Narrative   Not on file   Social Drivers of Health   Financial Resource Strain: Not on file  Food Insecurity: Not on file  Transportation Needs: Not on file  Physical Activity: Not on file  Stress: Not on file  Social Connections: Not on file  Intimate Partner Violence: Not on file   Family History  Problem Relation Age of Onset   Pseudochol deficiency Neg Hx    Malignant hyperthermia Neg Hx    Hypotension Neg Hx    Anesthesia problems Neg Hx       VITAL SIGNS BP 124/64   Pulse 69   Temp (!) 97.4 F (36.3 C)   Resp 18   Ht 4\' 9"  (1.448 m)   Wt 171 lb 12.8 oz (77.9 kg)   SpO2 95%   BMI 37.18 kg/m   Outpatient Encounter Medications as of 11/16/2023  Medication  Sig   acetaminophen (TYLENOL) 500 MG tablet Take 500 mg by mouth every 8 (eight) hours as needed.   acetaminophen (TYLENOL) 500 MG tablet Take 500 mg by mouth at bedtime.   atenolol (TENORMIN) 50 MG tablet Take 50 mg by mouth every evening.   BREO ELLIPTA 200-25 MCG/INH AEPB Inhale 1 puff into the lungs daily.   Calcium Carb-Cholecalciferol (CALCIUM 500 + D PO) Take 1 tablet by mouth daily.   Cholecalciferol (VITAMIN D3) 50 MCG (2000 UT) TABS Take 1 tablet by mouth daily.   Cyanocobalamin 1000 MCG/ML KIT Inject as directed. Once A Day on the 10th of the Month   furosemide (LASIX) 20 MG tablet Take 20 mg by mouth daily.   loratadine (CLARITIN) 10 MG tablet Take 10 mg by mouth daily.   melatonin 5 MG TABS Take 5 mg by mouth.   Multiple Vitamins-Minerals (PRESERVISION AREDS 2) CAPS Take 1 capsule by mouth in the morning and at bedtime.   NON FORMULARY Diet:Regular   potassium chloride SA (KLOR-CON) 20 MEQ tablet Take 40 mEq by mouth 2 (two) times daily.   No  facility-administered encounter medications on file as of 11/16/2023.     SIGNIFICANT DIAGNOSTIC EXAMS  PREVIOUS   03-02-21: DEXA: t score -3.258  NO NEW LABS.   LABS REVIEWED PREVIOUS  12-27-12: vitamin B 12: 328 01-29-23: wb 5.4; hgb 12.2; hct 37.5; mcv 102.7 plt 207; glucose 76; bun 15; creat 0.73; k+ 3.7; na++ 140; ca 8.6; gfr >60 protein 6.0 albumin 3.1; tsh 1.955  TODAY  09-06-23: wbc 4.0; hgb 12.0; hct 37.6; mcv 104.2 plt 191; glucose 75; bun 13; creat 0.61; k+ 4.5; na++ 138; ca 8.6; gfr >60; protein 5.7; albumin 3.0; tsh 1.917; vitamin B 12: 347  Review of Systems  Constitutional:  Negative for malaise/fatigue.  Respiratory:  Negative for cough and shortness of breath.   Cardiovascular:  Negative for chest pain, palpitations and leg swelling.  Gastrointestinal:  Negative for abdominal pain, constipation and heartburn.  Musculoskeletal:  Negative for back pain, joint pain and myalgias.  Skin: Negative.   Neurological:  Negative for dizziness.  Psychiatric/Behavioral:  The patient is not nervous/anxious.    Physical Exam Constitutional:      General: She is not in acute distress.    Appearance: She is well-developed. She is obese. She is not diaphoretic.  Neck:     Thyroid: No thyromegaly.  Cardiovascular:     Rate and Rhythm: Normal rate and regular rhythm.     Pulses: Normal pulses.     Heart sounds: Normal heart sounds.  Pulmonary:     Effort: Pulmonary effort is normal. No respiratory distress.     Breath sounds: Normal breath sounds.  Abdominal:     General: Bowel sounds are normal. There is no distension.     Palpations: Abdomen is soft.     Tenderness: There is no abdominal tenderness.     Comments: colostomy  Musculoskeletal:        General: Normal range of motion.     Cervical back: Neck supple.     Right lower leg: No edema.     Left lower leg: No edema.  Lymphadenopathy:     Cervical: No cervical adenopathy.  Skin:    General: Skin is warm and dry.   Neurological:     Mental Status: She is alert. Mental status is at baseline.  Psychiatric:        Mood and Affect: Mood normal.  ASSESSMENT/ PLAN:  TODAY  Aorto-iliac atherosclerosis Large bowel obstruction Colostomy status  Will continue current medications Will continue current plan of care Will continue to monitor her status   Time spent with patient: 40 minutes: medications; plan of care; dietary    Synthia Innocent NP [facility]  call [phone]

## 2023-12-04 ENCOUNTER — Other Ambulatory Visit (HOSPITAL_COMMUNITY)
Admission: RE | Admit: 2023-12-04 | Discharge: 2023-12-04 | Disposition: A | Discharge status: Home / Self Care | Source: Skilled Nursing Facility | Attending: Adult Health | Admitting: Adult Health

## 2023-12-04 DIAGNOSIS — I129 Hypertensive chronic kidney disease with stage 1 through stage 4 chronic kidney disease, or unspecified chronic kidney disease: Secondary | ICD-10-CM | POA: Diagnosis not present

## 2023-12-04 LAB — BASIC METABOLIC PANEL
Anion gap: 9 (ref 5–15)
BUN: 18 mg/dL (ref 8–23)
CO2: 24 mmol/L (ref 22–32)
Calcium: 9 mg/dL (ref 8.9–10.3)
Chloride: 106 mmol/L (ref 98–111)
Creatinine, Ser: 0.62 mg/dL (ref 0.44–1.00)
GFR, Estimated: >60 mL/min (ref >60)
Glucose, Bld: 75 mg/dL (ref 70–99)
Potassium: 4.3 mmol/L (ref 3.5–5.1)
Sodium: 139 mmol/L (ref 135–145)

## 2023-12-10 ENCOUNTER — Non-Acute Institutional Stay (SKILLED_NURSING_FACILITY): Payer: Self-pay | Admitting: Adult Health

## 2023-12-10 ENCOUNTER — Encounter: Payer: Self-pay | Admitting: Adult Health

## 2023-12-10 DIAGNOSIS — E876 Hypokalemia: Secondary | ICD-10-CM

## 2023-12-10 DIAGNOSIS — I708 Atherosclerosis of other arteries: Secondary | ICD-10-CM | POA: Diagnosis not present

## 2023-12-10 DIAGNOSIS — F339 Major depressive disorder, recurrent, unspecified: Secondary | ICD-10-CM | POA: Diagnosis not present

## 2023-12-10 DIAGNOSIS — I700 Atherosclerosis of aorta: Secondary | ICD-10-CM | POA: Diagnosis not present

## 2023-12-10 NOTE — Progress Notes (Unsigned)
 Location:  [facility] Room Number: 143 Place of Service:  SNF (31)   CODE STATUS: dnr   No Known Allergies  Chief Complaint  Patient presents with   Medical Management of Chronic Issues            Hypokalemia:  Major depression recurrent chronic: Aortoiliac atherosclerosis     HPI:  She is a 89 year old long term resident of this facility being seen for the management of her chronic illnesses: Hypokalemia:  Major depression recurrent chronic: Aortoiliac atherosclerosis. Overall there is little change in her status; her weight is stable; she continues to get out of bed daily; there are no reports of uncontrolled pain.   Past Medical History:  Diagnosis Date   Anxiety    Arthritis    Asthma    Cancer (HCC)    breast   Depression    Diverticulosis    GERD (gastroesophageal reflux disease)    HTN (hypertension)    Shortness of breath    exertion   Sleep apnea    STOP BANG 4   Thyroid dysfunction     Past Surgical History:  Procedure Laterality Date   ABDOMINAL HYSTERECTOMY     arthroscopic surgery rt knee  1997   BREAST SURGERY  2001   LEFT/ CANCER    broken radius ulna  1990   left   broken right ankle  1976   EXPLORATORY LAPAROTOMY  08/24/2020   with partial sigmoidectomy creat of end colostomy and application of negative pressure dressing.      JOINT REPLACEMENT  right knee 2005 [place]   left knee  2004   rt foot bone spur removed  1992   THYROID SURGERY  1983   TOTAL KNEE ARTHROPLASTY  01/22/2012   Procedure: TOTAL KNEE ARTHROPLASTY;  Surgeon: Vickki Hearing, MD;  Location: [facility];  Service: Orthopedics;  Laterality: Left;   TUBAL LIGATION      Social History   Socioeconomic History   Marital status: Widowed    Spouse name: Not on file   Number of children: Not on file   Years of education: Not on file   Highest education level: Not on file  Occupational History   Not on file  Tobacco Use   Smoking status: Former    Smokeless tobacco: Former  Advertising account planner   Vaping status: Never Used  Substance and Sexual Activity   Alcohol use: No   Drug use: No   Sexual activity: Never  Other Topics Concern   Not on file  Social History Narrative   Not on file   Social Drivers of Health   Financial Resource Strain: Not on file  Food Insecurity: Not on file  Transportation Needs: Not on file  Physical Activity: Not on file  Stress: Not on file  Social Connections: Not on file  Intimate Partner Violence: Not on file   Family History  Problem Relation Age of Onset   Pseudochol deficiency Neg Hx    Malignant hyperthermia Neg Hx    Hypotension Neg Hx    Anesthesia problems Neg Hx       VITAL SIGNS BP 107/62   Pulse 72   Temp 98.1 F (36.7 C)   Resp 18   Ht 4\' 9"  (1.448 m)   Wt 172 lb 12.8 oz (78.4 kg)   SpO2 96%   BMI 37.39 kg/m   Outpatient Encounter Medications as of 12/10/2023  Medication Sig   acetaminophen (  TYLENOL) 500 MG tablet Take 500 mg by mouth every 8 (eight) hours as needed.   acetaminophen (TYLENOL) 500 MG tablet Take 500 mg by mouth at bedtime.   atenolol (TENORMIN) 50 MG tablet Take 50 mg by mouth every evening.   BREO ELLIPTA 200-25 MCG/INH AEPB Inhale 1 puff into the lungs daily.   Calcium Carb-Cholecalciferol (CALCIUM 500 + D PO) Take 1 tablet by mouth daily.   Cholecalciferol (VITAMIN D3) 50 MCG (2000 UT) TABS Take 1 tablet by mouth daily.   Cyanocobalamin 1000 MCG/ML KIT Inject as directed. Once A Day on the 10th of the Month   furosemide (LASIX) 20 MG tablet Take 20 mg by mouth daily.   loratadine (CLARITIN) 10 MG tablet Take 10 mg by mouth daily.   melatonin 5 MG TABS Take 5 mg by mouth.   Multiple Vitamins-Minerals (PRESERVISION AREDS 2) CAPS Take 1 capsule by mouth in the morning and at bedtime.   NON FORMULARY Diet:Regular   potassium chloride SA (KLOR-CON) 20 MEQ tablet Take 40 mEq by mouth 2 (two) times daily.   No facility-administered encounter medications on  file as of 12/10/2023.     SIGNIFICANT DIAGNOSTIC EXAMS  LABS REVIEWED PREVIOUS    12-27-12: vitamin B 12: 328 01-29-23: wb 5.4; hgb 12.2; hct 37.5; mcv 102.7 plt 207; glucose 76; bun 15; creat 0.73; k+ 3.7; na++ 140; ca 8.6; gfr >60 protein 6.0 albumin 3.1; tsh 1.955 09-06-23: wbc 4.0; hgb 12.0; hct 37.6; mcv 104.2 plt 191; glucose 75; bun 13; creat 0.61; k+ 4.5; na++ 138; ca 8.6; gfr >60; protein 5.7; albumin 3.0; tsh 1.917; vitamin B 12: 347  NO NEW LABS   Review of Systems  Constitutional:  Negative for malaise/fatigue.  Respiratory:  Negative for cough and shortness of breath.   Cardiovascular:  Negative for chest pain, palpitations and leg swelling.  Gastrointestinal:  Negative for abdominal pain, constipation and heartburn.  Musculoskeletal:  Negative for back pain, joint pain and myalgias.  Skin: Negative.   Neurological:  Negative for dizziness.  Psychiatric/Behavioral:  The patient is not nervous/anxious.    Physical Exam Constitutional:      General: She is not in acute distress.    Appearance: She is well-developed. She is obese. She is not diaphoretic.  Neck:     Thyroid: No thyromegaly.  Cardiovascular:     Rate and Rhythm: Normal rate and regular rhythm.     Heart sounds: Normal heart sounds.  Pulmonary:     Effort: Pulmonary effort is normal. No respiratory distress.     Breath sounds: Normal breath sounds.  Abdominal:     General: Bowel sounds are normal. There is no distension.     Palpations: Abdomen is soft.     Tenderness: There is no abdominal tenderness.     Comments: Colostomy   Musculoskeletal:        General: Normal range of motion.     Cervical back: Neck supple.     Right lower leg: No edema.     Left lower leg: No edema.  Lymphadenopathy:     Cervical: No cervical adenopathy.  Skin:    General: Skin is warm and dry.  Neurological:     Mental Status: She is alert. Mental status is at baseline.     Comments: SLUMS: 17/30     Psychiatric:         Mood and Affect: Mood normal.      ASSESSMENT/ PLAN:  TODAY  Hypokalemia: k+ 3.7  will continue 40 meq twice daily   2. Major depression recurrent chronic: is currently taking melatonin nightly for sleep  3. Aortoiliac atherosclerosis (08-21-20) not on statin due to advanced age.   PREVIOUS   4. History of large bowel obstruction is status post colostomy   5. Degenerative disc disease, lumbar: will continue tylenol 500 mg nightly   6. Moderate persistent asthma in adult: is on breo ellipta 200/5 mcg 1 puff daily   7. Macrocytic anemia: hgb 12.2   8. Post menopausal osteoporosis t score -3.253  9. vitamin B 12 deficiency: level 347 on monthly injections  10. Bilateral lower extremity edema: is on lasix 20 mg daily with k+ 40 meq twice daily   11. Vascular dementia without behavioral disturbance: weight is 172 pounds without significant change  12. Non-seasonal allergic rhinitis unspecified trigger: will continue claritin 10 mg daily  13. Essential hypertension: b/p 107/62 will continue tenormin 50 mg daily    Synthia Innocent NP [facility]  call [phone]

## 2024-01-16 ENCOUNTER — Non-Acute Institutional Stay (SKILLED_NURSING_FACILITY): Payer: Self-pay | Admitting: Adult Health

## 2024-01-16 ENCOUNTER — Encounter: Payer: Self-pay | Admitting: Adult Health

## 2024-01-16 DIAGNOSIS — J4540 Moderate persistent asthma, uncomplicated: Secondary | ICD-10-CM | POA: Diagnosis not present

## 2024-01-16 DIAGNOSIS — M47816 Spondylosis without myelopathy or radiculopathy, lumbar region: Secondary | ICD-10-CM

## 2024-01-16 DIAGNOSIS — K56609 Unspecified intestinal obstruction, unspecified as to partial versus complete obstruction: Secondary | ICD-10-CM

## 2024-01-16 NOTE — Progress Notes (Signed)
 Location:  [facility] Room Number: 144 Place of Service:  SNF (31)   CODE STATUS: dnr  No Known Allergies  Chief Complaint  Patient presents with   Medical Management of Chronic Issues        History of large bowel obstruction:  Degenerative disc disease, lumbar: Moderate persistent asthma in adult     HPI:  She is a 89 y.o. long term resident of this facility being seen for the management of her chronic illnesses:History of large bowel obstruction:  Degenerative disc disease, lumbar: Moderate persistent asthma in adult. There are no reports of uncontrolled pain. There are no reports of anxiety or depressive thoughts.    Past Medical History:  Diagnosis Date   Anxiety    Arthritis    Asthma    Cancer (HCC)    breast   Depression    Diverticulosis    GERD (gastroesophageal reflux disease)    HTN (hypertension)    Shortness of breath    exertion   Sleep apnea    STOP BANG 4   Thyroid  dysfunction     Past Surgical History:  Procedure Laterality Date   ABDOMINAL HYSTERECTOMY     arthroscopic surgery rt knee  1997   BREAST SURGERY  2001   LEFT/ CANCER    broken radius ulna  1990   left   broken right ankle  1976   EXPLORATORY LAPAROTOMY  08/24/2020   with partial sigmoidectomy creat of end colostomy and application of negative pressure dressing.      JOINT REPLACEMENT  right knee 2005 [place]   left knee  2004   rt foot bone spur removed  1992   THYROID  SURGERY  1983   TOTAL KNEE ARTHROPLASTY  01/22/2012   Procedure: TOTAL KNEE ARTHROPLASTY;  Surgeon: Darrin Emerald, MD;  Location: [facility];  Service: Orthopedics;  Laterality: Left;   TUBAL LIGATION      Social History   Socioeconomic History   Marital status: Widowed    Spouse name: Not on file   Number of children: Not on file   Years of education: Not on file   Highest education level: Not on file  Occupational History   Not on file  Tobacco Use   Smoking status: Former    Smokeless tobacco: Former  Advertising account planner   Vaping status: Never Used  Substance and Sexual Activity   Alcohol use: No   Drug use: No   Sexual activity: Never  Other Topics Concern   Not on file  Social History Narrative   Not on file   Social Drivers of Health   Financial Resource Strain: Not on file  Food Insecurity: Not on file  Transportation Needs: Not on file  Physical Activity: Not on file  Stress: Not on file  Social Connections: Not on file  Intimate Partner Violence: Not on file   Family History  Problem Relation Age of Onset   Pseudochol deficiency Neg Hx    Malignant hyperthermia Neg Hx    Hypotension Neg Hx    Anesthesia problems Neg Hx       VITAL SIGNS BP 124/61   Pulse 73   Temp 97.7 F (36.5 C)   Resp 20   Ht 4\' 9"  (1.448 m)   Wt 173 lb 9.6 oz (78.7 kg)   SpO2 95%   BMI 37.57 kg/m   Outpatient Encounter Medications as of 01/16/2024  Medication Sig   folic acid  (FOLVITE ) 1  MG tablet Take 1 mg by mouth daily.   acetaminophen  (TYLENOL ) 500 MG tablet Take 500 mg by mouth every 8 (eight) hours as needed.   acetaminophen  (TYLENOL ) 500 MG tablet Take 500 mg by mouth at bedtime.   atenolol  (TENORMIN ) 50 MG tablet Take 50 mg by mouth every evening.   BREO ELLIPTA  200-25 MCG/INH AEPB Inhale 1 puff into the lungs daily.   Calcium Carb-Cholecalciferol  (CALCIUM 500 + D PO) Take 1 tablet by mouth daily.   Cholecalciferol  (VITAMIN D3) 50 MCG (2000 UT) TABS Take 1 tablet by mouth daily.   Cyanocobalamin  1000 MCG/ML KIT Inject as directed. Once A Day on the 10th of the Month   furosemide (LASIX) 20 MG tablet Take 20 mg by mouth daily.   loratadine  (CLARITIN ) 10 MG tablet Take 10 mg by mouth daily.   melatonin 5 MG TABS Take 5 mg by mouth.   Multiple Vitamins-Minerals (PRESERVISION AREDS 2) CAPS Take 1 capsule by mouth in the morning and at bedtime.   NON FORMULARY Diet:Regular   potassium chloride  SA (KLOR-CON ) 20 MEQ tablet Take 40 mEq by mouth 2 (two) times  daily.   No facility-administered encounter medications on file as of 01/16/2024.     SIGNIFICANT DIAGNOSTIC EXAMS  LABS REVIEWED PREVIOUS    12-27-12: vitamin B 12: 328 01-29-23: wb 5.4; hgb 12.2; hct 37.5; mcv 102.7 plt 207; glucose 76; bun 15; creat 0.73; k+ 3.7; na++ 140; ca 8.6; gfr >60 protein 6.0 albumin 3.1; tsh 1.955 09-06-23: wbc 4.0; hgb 12.0; hct 37.6; mcv 104.2 plt 191; glucose 75; bun 13; creat 0.61; k+ 4.5; na++ 138; ca 8.6; gfr >60; protein 5.7; albumin 3.0; tsh 1.917; vitamin B 12: 347  TODAY  10-26-23: folate 3.3 12-04-23: glucose 75; bun 18; creat 0.62; k+ 4.3; na++ 139; ca 9.0; gfr >60  Review of Systems  Constitutional:  Negative for malaise/fatigue.  Respiratory:  Negative for cough and shortness of breath.   Cardiovascular:  Negative for chest pain, palpitations and leg swelling.  Gastrointestinal:  Negative for abdominal pain, constipation and heartburn.  Musculoskeletal:  Negative for back pain, joint pain and myalgias.  Skin: Negative.   Neurological:  Negative for dizziness.  Psychiatric/Behavioral:  The patient is not nervous/anxious.    Physical Exam Constitutional:      General: She is not in acute distress.    Appearance: She is well-developed. She is obese. She is not diaphoretic.  Neck:     Thyroid : No thyromegaly.  Cardiovascular:     Rate and Rhythm: Normal rate and regular rhythm.     Pulses: Normal pulses.     Heart sounds: Normal heart sounds.  Pulmonary:     Effort: Pulmonary effort is normal. No respiratory distress.     Breath sounds: Normal breath sounds.  Abdominal:     General: Bowel sounds are normal. There is no distension.     Palpations: Abdomen is soft.     Tenderness: There is no abdominal tenderness.     Comments: colostomy  Musculoskeletal:        General: Normal range of motion.     Cervical back: Neck supple.     Right lower leg: Edema present.     Left lower leg: Edema present.  Lymphadenopathy:     Cervical: No  cervical adenopathy.  Skin:    General: Skin is warm and dry.  Neurological:     Mental Status: She is alert. Mental status is at baseline.  Comments: SLUMS: 17/30   Psychiatric:        Mood and Affect: Mood normal.      ASSESSMENT/ PLAN:  TODAY  History of large bowel obstruction: status post colostomy  2. Degenerative disc disease, lumbar: will continue tylenol  500 mg nightly   3. Moderate persistent asthma in adult: is on breo ellipta  200/5 mcg 1 puff daily   PREVIOUS   4. Macrocytic anemia: hgb 12.2   5. Post menopausal osteoporosis t score -3.253  6. vitamin B 12 deficiency: level 347 on monthly injections  7. Bilateral lower extremity edema: is on lasix 20 mg daily with k+ 40 meq twice daily   8. Vascular dementia without behavioral disturbance: weight is 173 pounds without significant change  9. Non-seasonal allergic rhinitis unspecified trigger: will continue claritin  10 mg daily  10. Essential hypertension: b/p 124/61 will continue tenormin  50 mg daily   11.Hypokalemia: k+ 4.3 will continue 40 meq twice daily   12. Major depression recurrent chronic: is currently taking melatonin nightly for sleep  13. Aortoiliac atherosclerosis (08-21-20) not on statin due to advanced age.   14. Folic acid  deficiency: level 3.3; will continue folic acid  1 mg daily    Britt Candle NP [facility]  call [phone]

## 2024-01-21 ENCOUNTER — Encounter: Payer: Self-pay | Admitting: Adult Health

## 2024-01-21 ENCOUNTER — Non-Acute Institutional Stay (SKILLED_NURSING_FACILITY): Payer: Self-pay | Admitting: Adult Health

## 2024-01-21 DIAGNOSIS — Z0000 Encounter for general adult medical examination without abnormal findings: Secondary | ICD-10-CM | POA: Diagnosis not present

## 2024-01-21 NOTE — Patient Instructions (Signed)
  [name] , Thank you for taking time to come for your Medicare Wellness Visit. I appreciate your ongoing commitment to your health goals. Please review the following plan we discussed and let me know if I can assist you in the future.   These are the goals we discussed:  Goals      Absence of Fall and Fall-Related Injury     Evidence-based guidance:  Assess fall risk using a validated tool when available. Consider balance and gait impairment, muscle weakness, diminished vision or hearing, environmental hazards, presence of urinary or bowel urgency and/or incontinence.  Communicate fall injury risk to interprofessional healthcare team.  Develop a fall prevention plan with the patient and family.  Promote use of personal vision and auditory aids.  Promote reorientation, appropriate sensory stimulation, and routines to decrease risk of fall when changes in mental status are present.  Assess assistance level required for safe and effective self-care; consider referral for home care.  Encourage physical activity, such as performance of self-care at highest level of ability, strength and balance exercise program, and provision of appropriate assistive devices; refer to rehabilitation therapy.  Refer to community-based fall prevention program where available.  If fall occurs, determine the cause and revise fall injury prevention plan.  Regularly review medication contribution to fall risk; consider risk related to polypharmacy and age.  Refer to pharmacist for consultation when concerns about medications are revealed.  Balance adequate pain management with potential for oversedation.  Provide guidance related to environmental modifications.  Consider supplementation with Vitamin D .   Notes:      Follow up with Provider as scheduled     General - Client will not be readmitted within 30 days ([facility])        This is a list of the screening recommended for you and due dates:  Health  Maintenance  Topic Date Due   COVID-19 Vaccine (9 - 2024-25 season) 06/25/2024   Medicare Annual Wellness Visit  01/20/2025   DTaP/Tdap/Td vaccine (4 - Td or Tdap) 07/28/2031   Pneumonia Vaccine  Completed   DEXA scan (bone density measurement)  Completed   Zoster (Shingles) Vaccine  Completed   HPV Vaccine  Aged Out   Meningitis B Vaccine  Aged Out   Flu Shot  Discontinued

## 2024-01-21 NOTE — Progress Notes (Signed)
 Subjective:   [name] is a 89 y.o. female who presents for Medicare Annual (Subsequent) preventive examination.  Visit Complete: In person  Patient Medicare AWV questionnaire was completed by the patient on 01-21-24; I have confirmed that all information answered by patient is correct and no changes since this date.  Cardiac Risk Factors include: advanced age (>50men, >30 women);hypertension;obesity (BMI >30kg/m2);sedentary lifestyle     Objective:    Today's Vitals   01/21/24 1511  BP: 125/69  Pulse: 70  Resp: 18  Temp: 97.8 F (36.6 C)  SpO2: 96%  Weight: 173 lb 9.6 oz (78.7 kg)  Height: 4\' 9"  (1.448 m)   Body mass index is 37.57 kg/m.     08/17/2023   11:45 AM 02/16/2023    4:28 PM 01/15/2023    3:48 PM 11/17/2022   11:14 AM 10/12/2022    1:37 PM 08/25/2022    9:59 AM 07/06/2022   12:19 PM  Advanced Directives  Does Patient Have a Medical Advance Directive? Yes Yes Yes Yes Yes Yes Yes  Type of Advance Directive Out of facility DNR (pink MOST or yellow form) Out of facility DNR (pink MOST or yellow form) Out of facility DNR (pink MOST or yellow form) Out of facility DNR (pink MOST or yellow form) Out of facility DNR (pink MOST or yellow form) Out of facility DNR (pink MOST or yellow form) Out of facility DNR (pink MOST or yellow form)  Does patient want to make changes to medical advance directive? No - Patient declined No - Patient declined No - Patient declined No - Patient declined No - Patient declined No - Patient declined No - Patient declined  Pre-existing out of facility DNR order (yellow form or pink MOST form) Yellow form placed in chart (order not valid for inpatient use) Yellow form placed in chart (order not valid for inpatient use) Yellow form placed in chart (order not valid for inpatient use) Yellow form placed in chart (order not valid for inpatient use) Yellow form placed in chart (order not valid for inpatient use) Yellow form placed in chart  (order not valid for inpatient use) Yellow form placed in chart (order not valid for inpatient use)    Current Medications (verified) Outpatient Encounter Medications as of 01/21/2024  Medication Sig   acetaminophen  (TYLENOL ) 500 MG tablet Take 500 mg by mouth every 8 (eight) hours as needed.   acetaminophen  (TYLENOL ) 500 MG tablet Take 500 mg by mouth at bedtime.   atenolol  (TENORMIN ) 50 MG tablet Take 50 mg by mouth every evening.   BREO ELLIPTA  200-25 MCG/INH AEPB Inhale 1 puff into the lungs daily.   Calcium Carb-Cholecalciferol  (CALCIUM 500 + D PO) Take 1 tablet by mouth daily.   Cholecalciferol  (VITAMIN D3) 50 MCG (2000 UT) TABS Take 1 tablet by mouth daily.   Cyanocobalamin  1000 MCG/ML KIT Inject as directed. Once A Day on the 10th of the Month   furosemide (LASIX) 20 MG tablet Take 20 mg by mouth daily.   loratadine  (CLARITIN ) 10 MG tablet Take 10 mg by mouth daily.   melatonin 5 MG TABS Take 5 mg by mouth.   Multiple Vitamins-Minerals (PRESERVISION AREDS 2) CAPS Take 1 capsule by mouth in the morning and at bedtime.   NON FORMULARY Diet:Regular   potassium chloride  SA (KLOR-CON ) 20 MEQ tablet Take 40 mEq by mouth 2 (two) times daily.   No facility-administered encounter medications on file as of 01/21/2024.    Allergies (verified) Patient  has no known allergies.   History: Past Medical History:  Diagnosis Date   Anxiety    Arthritis    Asthma    Cancer (HCC)    breast   Depression    Diverticulosis    GERD (gastroesophageal reflux disease)    HTN (hypertension)    Shortness of breath    exertion   Sleep apnea    STOP BANG 4   Thyroid  dysfunction    Past Surgical History:  Procedure Laterality Date   ABDOMINAL HYSTERECTOMY     arthroscopic surgery rt knee  1997   BREAST SURGERY  2001   LEFT/ CANCER    broken radius ulna  1990   left   broken right ankle  1976   EXPLORATORY LAPAROTOMY  08/24/2020   with partial sigmoidectomy creat of end colostomy and  application of negative pressure dressing.      JOINT REPLACEMENT  right knee 2005 [facility]   left knee  2004   rt foot bone spur removed  1992   THYROID  SURGERY  1983   TOTAL KNEE ARTHROPLASTY  01/22/2012   Procedure: TOTAL KNEE ARTHROPLASTY;  Surgeon: Darrin Emerald, MD;  Location: [facility];  Service: Orthopedics;  Laterality: Left;   TUBAL LIGATION     Family History  Problem Relation Age of Onset   Pseudochol deficiency Neg Hx    Malignant hyperthermia Neg Hx    Hypotension Neg Hx    Anesthesia problems Neg Hx    Social History   Socioeconomic History   Marital status: Widowed    Spouse name: Not on file   Number of children: Not on file   Years of education: Not on file   Highest education level: Not on file  Occupational History   Not on file  Tobacco Use   Smoking status: Former   Smokeless tobacco: Former  Advertising account planner   Vaping status: Never Used  Substance and Sexual Activity   Alcohol use: No   Drug use: No   Sexual activity: Never  Other Topics Concern   Not on file  Social History Narrative   Not on file   Social Drivers of Health   Financial Resource Strain: Not on file  Food Insecurity: Not on file  Transportation Needs: Not on file  Physical Activity: Not on file  Stress: Not on file  Social Connections: Not on file    Tobacco Counseling Counseling given: Not Answered   Clinical Intake:  Pre-visit preparation completed: Yes  Pain : No/denies pain     BMI - recorded: 37.57 Nutritional Status: BMI > 30  Obese Nutritional Risks: Unintentional weight loss, Failure to thrive Diabetes: No  How often do you need to have someone help you when you read instructions, pamphlets, or other written materials from your doctor or pharmacy?: 5 - Always  Interpreter Needed?: No  Comments: long term resident of SNF   Activities of Daily Living    01/21/2024    3:14 PM  In your present state of health, do you have any difficulty performing the  following activities:  Hearing? 0  Vision? 0  Difficulty concentrating or making decisions? 1  Walking or climbing stairs? 1  Dressing or bathing? 1  Doing errands, shopping? 1  Preparing Food and eating ? Y  Using the Toilet? Y  In the past six months, have you accidently leaked urine? Y  Do you have problems with loss of bowel control? Y  Managing your Medications? Fernande Howells  Managing your Finances? Y  Housekeeping or managing your Housekeeping? Y    Patient Care Team: Marilyne Shu, NP as PCP - General (Geriatric Medicine)  Indicate any recent Medical Services you may have received from other than [facility] providers in the past year (date may be approximate).     Assessment:   This is a routine wellness examination for Alpha.  Hearing/Vision screen No results found.   Goals Addressed             This Visit's Progress    Absence of Fall and Fall-Related Injury   On track    Evidence-based guidance:  Assess fall risk using a validated tool when available. Consider balance and gait impairment, muscle weakness, diminished vision or hearing, environmental hazards, presence of urinary or bowel urgency and/or incontinence.  Communicate fall injury risk to interprofessional healthcare team.  Develop a fall prevention plan with the patient and family.  Promote use of personal vision and auditory aids.  Promote reorientation, appropriate sensory stimulation, and routines to decrease risk of fall when changes in mental status are present.  Assess assistance level required for safe and effective self-care; consider referral for home care.  Encourage physical activity, such as performance of self-care at highest level of ability, strength and balance exercise program, and provision of appropriate assistive devices; refer to rehabilitation therapy.  Refer to community-based fall prevention program where available.  If fall occurs, determine the cause and revise fall injury prevention plan.   Regularly review medication contribution to fall risk; consider risk related to polypharmacy and age.  Refer to pharmacist for consultation when concerns about medications are revealed.  Balance adequate pain management with potential for oversedation.  Provide guidance related to environmental modifications.  Consider supplementation with Vitamin D .   Notes:      Follow up with Provider as scheduled   On track    General - Client will not be readmitted within 30 days ([facility])   On track      Depression Screen    01/21/2024    3:16 PM 01/21/2024    3:15 PM 01/17/2023   10:32 AM 03/02/2022   12:59 PM 01/16/2022   11:06 AM 10/13/2021    1:55 PM 09/22/2021   12:19 PM  PHQ 2/9 Scores  PHQ - 2 Score 0 0 0 0 0 0 0  PHQ- 9 Score 0   0       Fall Risk    01/21/2024    3:15 PM 04/11/2023   10:58 AM 01/17/2023   10:32 AM 11/17/2022   11:14 AM 01/16/2022   11:05 AM  Fall Risk   Falls in the past year? 0 0 0 0 1  Number falls in past yr: 0 0 0 0 0  Injury with Fall? 0 0 0 0 0  Risk for fall due to : Impaired balance/gait;Impaired mobility Impaired balance/gait;Impaired mobility History of fall(s);Impaired mobility No Fall Risks History of fall(s);Impaired balance/gait;Impaired mobility  Follow up  Falls evaluation completed Falls evaluation completed Falls evaluation completed     MEDICARE RISK AT HOME: Medicare Risk at Home Any stairs in or around the home?: Yes If so, are there any without handrails?: No Home free of loose throw rugs in walkways, pet beds, electrical cords, etc?: Yes Adequate lighting in your home to reduce risk of falls?: Yes Life alert?: No Use of a cane, walker or w/c?: Yes Grab bars in the bathroom?: Yes Shower chair or bench in shower?: Yes Elevated  toilet seat or a handicapped toilet?: Yes  TIMED UP AND GO:  Was the test performed?  No    Cognitive Function:    01/21/2024    3:15 PM 01/17/2023   10:33 AM 01/16/2022   11:06 AM 01/11/2021   11:19 AM  MMSE  - Mini Mental State Exam  Not completed: Unable to complete  Unable to complete Unable to complete  Orientation to time  3    Orientation to Place  4    Registration  2    Attention/ Calculation  4    Recall  2    Language- name 2 objects  2    Language- repeat  1    Language- follow 3 step command  3    Language- read & follow direction  1    Write a sentence  0    Copy design  0    Total score  22          01/17/2023   10:35 AM  6CIT Screen  What Year? 0 points  What month? 0 points  What time? 3 points  Count back from 20 4 points  Months in reverse 4 points  Repeat phrase 4 points  Total Score 15 points    Immunizations Immunization History  Administered Date(s) Administered   Fluad Quad(high Dose 65+) 06/29/2021   Influenza-Unspecified 06/23/2020   Moderna Covid-19 Fall Seasonal Vaccine 71yrs & older 07/03/2023   Moderna Covid-19 Vaccine Bivalent Booster 55yrs & up 07/19/2021   Moderna SARS-COV2 Booster Vaccination 09/23/2020, 11/01/2020, 02/14/2022, 07/19/2022   Moderna Sars-Covid-2 Vaccination 01/15/2020, 02/12/2020, 12/25/2023   PNEUMOCOCCAL CONJUGATE-20 03/10/2022   Pneumococcal Conjugate-13 02/15/2011   Pneumococcal Polysaccharide-23 05/23/2018   Rsv, Bivalent, Protein Subunit Rsvpref,pf Pattricia Bores) 08/28/2022   Td 09/01/2020   Tdap 02/15/2011, 07/27/2021   Zoster Recombinant(Shingrix) 05/23/2018, 06/23/2020    TDAP status: Up to date  Flu Vaccine status: Up to date  Pneumococcal vaccine status: Up to date  Covid-19 vaccine status: Completed vaccines  Qualifies for Shingles Vaccine? Yes   Zostavax completed Yes     Screening Tests Health Maintenance  Topic Date Due   COVID-19 Vaccine (9 - 2024-25 season) 06/25/2024   Medicare Annual Wellness (AWV)  01/20/2025   DTaP/Tdap/Td (4 - Td or Tdap) 07/28/2031   Pneumonia Vaccine 67+ Years old  Completed   DEXA SCAN  Completed   Zoster Vaccines- Shingrix  Completed   HPV VACCINES  Aged Out    Meningococcal B Vaccine  Aged Out   INFLUENZA VACCINE  Discontinued    Health Maintenance  There are no preventive care reminders to display for this patient.   Colorectal cancer screening: No longer required.   Mammogram status: No longer required due to age.  Bone Density status: Completed 2023. Results reflect: Bone density results: OSTEOPOROSIS. Repeat every   years.  Lung Cancer Screening: (Low Dose CT Chest recommended if Age 47-80 years, 20 pack-year currently smoking OR have quit w/in 15years.) does not qualify.   Lung Cancer Screening Referral:   Additional Screening:  Hepatitis C Screening: does not qualify; Completed   Vision Screening: Recommended annual ophthalmology exams for early detection of glaucoma and other disorders of the eye. Is the patient up to date with their annual eye exam?  No  Who is the provider or what is the name of the office in which the patient attends annual eye exams?  If pt is not established with a provider, would they like to be referred to  a provider to establish care? No .   Dental Screening: Recommended annual dental exams for proper oral hygiene  Diabetic Foot Exam:   Community Resource Referral / Chronic Care Management: CRR required this visit?  No   CCM required this visit?  No     Plan:     I have personally reviewed and noted the following in the patient's chart:   Medical and social history Use of alcohol, tobacco or illicit drugs  Current medications and supplements including opioid prescriptions. Patient is not currently taking opioid prescriptions. Functional ability and status Nutritional status Physical activity Advanced directives List of other physicians Hospitalizations, surgeries, and ER visits in previous 12 months Vitals Screenings to include cognitive, depression, and falls Referrals and appointments  In addition, I have reviewed and discussed with patient certain preventive protocols, quality  metrics, and best practice recommendations. A written personalized care plan for preventive services as well as general preventive health recommendations were provided to patient.     Marilyne Shu, NP   01/21/2024   After Visit Summary: (In Person-Printed) AVS printed and given to the patient  Nurse Notes: this exam was performed by myself at this facility

## 2024-02-05 ENCOUNTER — Encounter: Payer: Self-pay | Admitting: Internal Medicine

## 2024-02-05 ENCOUNTER — Non-Acute Institutional Stay (SKILLED_NURSING_FACILITY): Payer: Self-pay | Admitting: Internal Medicine

## 2024-02-05 DIAGNOSIS — Z933 Colostomy status: Secondary | ICD-10-CM | POA: Diagnosis not present

## 2024-02-05 DIAGNOSIS — E440 Moderate protein-calorie malnutrition: Secondary | ICD-10-CM

## 2024-02-05 DIAGNOSIS — E538 Deficiency of other specified B group vitamins: Secondary | ICD-10-CM

## 2024-02-05 NOTE — Progress Notes (Unsigned)
   NURSING HOME LOCATION:  [facility] ROOM NUMBER:  [id number] P  CODE STATUS:  DNR  PCP: Marilyne Shu, NP   This is a nursing facility follow up visit of chronic medical diagnoses to document compliance with Regulation 483.30 (c) in [employer] Term [employer] Survey Manual Phase 2 which mandates caregiver visit ( visits can alternate among physician, PA or NP as per statutes) within 10 days of 30 days / 60 days/ 90 days post admission to SNF date  .  Interim medical record and care since last SNF visit was updated with review of diagnostic studies and change in clinical status since last visit were documented.  HPI: She is a permanent resident of this facility with medical diagnoses of B12 deficiency, protein/caloric malnutrition, folic acid  deficiency, history of asthma, GERD, essential hypertension, aorto-iliac atherosclerosis, osteoporosis, thyroid  dysfunction, and history of vascular dementia.  Review of systems: Despite history of dementia; she was an excellent historian.  She gave me a detailed history of her family and her 5 children including their age and birth months.  She states that she works puzzles on a regular basis.  She states that she is "all right."  Her only complaint is that her dentures were "wearing out."  Constitutional: No fever, significant weight change, fatigue  Eyes: No redness, discharge, pain, vision change ENT/mouth: No nasal congestion,  purulent discharge, earache, change in hearing, sore throat  Cardiovascular: No chest pain, palpitations, paroxysmal nocturnal dyspnea  Respiratory: No cough, sputum production, hemoptysis, DOE, significant snoring, apnea   Gastrointestinal: No heartburn, dysphagia, abdominal pain, nausea /vomiting, rectal bleeding, melena, change in bowels Genitourinary: No dysuria, hematuria, pyuria, incontinence, nocturia Musculoskeletal: No joint stiffness, joint swelling, weakness, pain Dermatologic: No rash, pruritus, change in  appearance of skin Neurologic: No dizziness, headache, syncope, seizures, numbness, tingling Psychiatric: No significant anxiety, depression, insomnia, anorexia Endocrine: No change in hair/skin/nails, excessive thirst, excessive hunger, excessive urination  Hematologic/lymphatic: No significant bruising, lymphadenopathy, abnormal bleeding Allergy/immunology: No itchy/watery eyes, significant sneezing, urticaria, angioedema  Physical exam:  Pertinent or positive findings: She is dramatically hard of hearing.  She has complete dentures.  The second heart sound is increased.  Abdomen is protuberant. Ostomy present. Pedal pulses are not palpable.  She has 1/2+ edema at the sock line.  General appearance: Adequately nourished; no acute distress, increased work of breathing is present.   Lymphatic: No lymphadenopathy about the head, neck, axilla. Eyes: No conjunctival inflammation or lid edema is present. There is no scleral icterus. Ears:  External ear exam shows no significant lesions or deformities.   Nose:  External nasal examination shows no deformity or inflammation. Nasal mucosa are pink and moist without lesions, exudates Oral exam:  Lips and gums are healthy appearing. There is no oropharyngeal erythema or exudate. Neck:  No thyromegaly, masses, tenderness noted.    Heart:  No gallop, murmur, click, rub .  Lungs: Chest clear to auscultation without wheezes, rhonchi, rales, rubs. Abdomen: Bowel sounds are normal. Abdomen is soft and nontender with no organomegaly, hernias, masses. GU: Deferred  Extremities:  No cyanosis, clubbing  Neurologic exam :Balance, Rhomberg, finger to nose testing could not be completed due to clinical state Skin: Warm & dry w/o tenting. No significant lesions or rash.  See summary under each active problem in the Problem List with associated updated therapeutic plan

## 2024-02-05 NOTE — Patient Instructions (Signed)
 See assessment and plan under each diagnosis in the problem list and acutely for this visit:  Vitamin B12 deficiency disease Macrocytosis present: MCV 104.2. B12 level 341 on 1000 mcg B12 every day.  Unspecified protein-calorie malnutrition (HCC) Most recent albumin 3.0 & total protein 5.7. Meal consumption dependent upon food preferences.Weight essentially stable. Nutritionist to monitor.  Folate deficiency Most recent folic acid  level 3.3 ; she is on 1 mg daily.   Colostomy status (HCC) No ostomy site complications reported by staff or Wound Care Nurse.

## 2024-02-05 NOTE — Assessment & Plan Note (Signed)
 Most recent folic acid  level 3.3 ; she is on 1 mg daily.

## 2024-02-05 NOTE — Assessment & Plan Note (Signed)
 Most recent albumin 3.0 & total protein 5.7. Weight essentially stable. Nutritionist to monitor.

## 2024-02-05 NOTE — Assessment & Plan Note (Signed)
 Macrocytosis present: MCV 104.2. B12 level 341 on 1000 mcg B12 every day.

## 2024-02-06 NOTE — Assessment & Plan Note (Signed)
 No ostomy site complications reported by staff or Wound Care Nurse.

## 2024-02-13 ENCOUNTER — Encounter: Payer: Self-pay | Admitting: Adult Health

## 2024-02-13 NOTE — Progress Notes (Signed)
 Location:  [facility] Room Number: 144 Place of Service:  SNF (31)   CODE STATUS: dnr   No Known Allergies  Chief Complaint  Patient presents with   Acute Visit    Care plan meeting     HPI:  We have come together for her care plan meeting.  BIMS 15/15, mood 0/30. Uses wheelchair without falls. She requires. Supervision to moderate assist with her adl care.  She is frequently incontinent of bladder has a colostomy. Dietary: feeds self; regular diet appetite 50-100% weight is 171.8 pounds. Therapy: none at this time. She will continue to be followed for her chronic illnesses including:  Benign hypertension with ckd stage 2 Major depression recurrent chronic   Aorto-iliac atherosclerosis  Past Medical History:  Diagnosis Date   Anxiety    Arthritis    Asthma    Cancer (HCC)    breast   Depression    Diverticulosis    GERD (gastroesophageal reflux disease)    HTN (hypertension)    Shortness of breath    exertion   Sleep apnea    STOP BANG 4   Thyroid  dysfunction     Past Surgical History:  Procedure Laterality Date   ABDOMINAL HYSTERECTOMY     arthroscopic surgery rt knee  1997   BREAST SURGERY  2001   LEFT/ CANCER    broken radius ulna  1990   left   broken right ankle  1976   EXPLORATORY LAPAROTOMY  08/24/2020   with partial sigmoidectomy creat of end colostomy and application of negative pressure dressing.      JOINT REPLACEMENT  right knee 2005 [place]   left knee  2004   rt foot bone spur removed  1992   THYROID  SURGERY  1983   TOTAL KNEE ARTHROPLASTY  01/22/2012   Procedure: TOTAL KNEE ARTHROPLASTY;  Surgeon: Darrin Emerald, MD;  Location: [facility];  Service: Orthopedics;  Laterality: Left;   TUBAL LIGATION      Social History   Socioeconomic History   Marital status: Widowed    Spouse name: Not on file   Number of children: Not on file   Years of education: Not on file   Highest education level: Not on file  Occupational  History   Not on file  Tobacco Use   Smoking status: Former   Smokeless tobacco: Former  Advertising account planner   Vaping status: Never Used  Substance and Sexual Activity   Alcohol use: No   Drug use: No   Sexual activity: Never  Other Topics Concern   Not on file  Social History Narrative   Not on file   Social Drivers of Health   Financial Resource Strain: Not on file  Food Insecurity: Not on file  Transportation Needs: Not on file  Physical Activity: Not on file  Stress: Not on file  Social Connections: Not on file  Intimate Partner Violence: Not on file   Family History  Problem Relation Age of Onset   Pseudochol deficiency Neg Hx    Malignant hyperthermia Neg Hx    Hypotension Neg Hx    Anesthesia problems Neg Hx       VITAL SIGNS BP 136/63   Pulse 76   Temp (!) 97.1 F (36.2 C)   Resp 20   Ht 4\' 9"  (1.448 m)   Wt 171 lb 12.8 oz (77.9 kg)   SpO2 98%   BMI 37.18 kg/m   Outpatient Encounter Medications as of  02/14/2024  Medication Sig   acetaminophen  (TYLENOL ) 500 MG tablet Take 500 mg by mouth every 8 (eight) hours as needed.   acetaminophen  (TYLENOL ) 500 MG tablet Take 500 mg by mouth at bedtime.   atenolol  (TENORMIN ) 50 MG tablet Take 50 mg by mouth every evening.   BREO ELLIPTA  200-25 MCG/INH AEPB Inhale 1 puff into the lungs daily.   Calcium Carb-Cholecalciferol  (CALCIUM 500 + D PO) Take 1 tablet by mouth daily.   Cholecalciferol  (VITAMIN D3) 50 MCG (2000 UT) TABS Take 1 tablet by mouth daily.   Cyanocobalamin  1000 MCG/ML KIT Inject as directed. Once A Day on the 10th of the Month   folic acid  (FOLVITE ) 1 MG tablet Take 1 mg by mouth daily.   furosemide (LASIX) 20 MG tablet Take 20 mg by mouth daily.   loratadine  (CLARITIN ) 10 MG tablet Take 10 mg by mouth daily.   melatonin 5 MG TABS Take 5 mg by mouth.   Multiple Vitamins-Minerals (PRESERVISION AREDS 2) CAPS Take 1 capsule by mouth in the morning and at bedtime.   NON FORMULARY Diet:Regular   potassium  chloride SA (KLOR-CON ) 20 MEQ tablet Take 40 mEq by mouth 2 (two) times daily.   No facility-administered encounter medications on file as of 02/14/2024.     SIGNIFICANT DIAGNOSTIC EXAMS  LABS REVIEWED PREVIOUS    01-29-23: wb 5.4; hgb 12.2; hct 37.5; mcv 102.7 plt 207; glucose 76; bun 15; creat 0.73; k+ 3.7; na++ 140; ca 8.6; gfr >60 protein 6.0 albumin 3.1; tsh 1.955 09-06-23: wbc 4.0; hgb 12.0; hct 37.6; mcv 104.2 plt 191; glucose 75; bun 13; creat 0.61; k+ 4.5; na++ 138; ca 8.6; gfr >60; protein 5.7; albumin 3.0; tsh 1.917; vitamin B 12: 347 10-26-23: folate 3.3 12-04-23: glucose 75; bun 18; creat 0.62; k+ 4.3; na++ 139; ca 9.0; gfr >60  NO NEW LABS.   Review of Systems  Constitutional:  Negative for malaise/fatigue.  Respiratory:  Negative for cough and shortness of breath.   Cardiovascular:  Negative for chest pain, palpitations and leg swelling.  Gastrointestinal:  Negative for abdominal pain, constipation and heartburn.  Musculoskeletal:  Negative for back pain, joint pain and myalgias.  Skin: Negative.   Neurological:  Negative for dizziness.  Psychiatric/Behavioral:  The patient is not nervous/anxious.    Physical Exam Constitutional:      General: She is not in acute distress.    Appearance: She is well-developed. She is not diaphoretic.  Neck:     Thyroid : No thyromegaly.  Cardiovascular:     Rate and Rhythm: Normal rate and regular rhythm.     Pulses: Normal pulses.     Heart sounds: Normal heart sounds.  Pulmonary:     Effort: Pulmonary effort is normal. No respiratory distress.     Breath sounds: Normal breath sounds.  Abdominal:     General: Bowel sounds are normal. There is no distension.     Palpations: Abdomen is soft.     Tenderness: There is no abdominal tenderness.     Comments: Colostomy   Musculoskeletal:        General: Normal range of motion.     Cervical back: Neck supple.     Right lower leg: Edema present.     Left lower leg: Edema present.   Lymphadenopathy:     Cervical: No cervical adenopathy.  Skin:    General: Skin is warm and dry.  Neurological:     Mental Status: She is alert. Mental status is at  baseline.  Psychiatric:        Mood and Affect: Mood normal.      ASSESSMENT/ PLAN:  TODAY  Benign hypertension with ckd stage 2 Major depression recurrent chronic Aorto-iliac atherosclerosis  Will continue current medications Will continue current plan of care Will continue to monitor her status  Time spent with patient: 40 minutes: medications; plan of care; dietary    Britt Candle NP [facility]  call [phone]

## 2024-02-14 ENCOUNTER — Non-Acute Institutional Stay (SKILLED_NURSING_FACILITY): Payer: Self-pay | Admitting: Adult Health

## 2024-02-14 DIAGNOSIS — I708 Atherosclerosis of other arteries: Secondary | ICD-10-CM

## 2024-02-14 DIAGNOSIS — N182 Chronic kidney disease, stage 2 (mild): Secondary | ICD-10-CM

## 2024-02-14 DIAGNOSIS — I700 Atherosclerosis of aorta: Secondary | ICD-10-CM

## 2024-02-14 DIAGNOSIS — I129 Hypertensive chronic kidney disease with stage 1 through stage 4 chronic kidney disease, or unspecified chronic kidney disease: Secondary | ICD-10-CM

## 2024-02-14 DIAGNOSIS — F339 Major depressive disorder, recurrent, unspecified: Secondary | ICD-10-CM

## 2024-03-06 ENCOUNTER — Non-Acute Institutional Stay (SKILLED_NURSING_FACILITY): Payer: Self-pay | Admitting: Adult Health

## 2024-03-06 DIAGNOSIS — E538 Deficiency of other specified B group vitamins: Secondary | ICD-10-CM | POA: Diagnosis not present

## 2024-03-06 DIAGNOSIS — D539 Nutritional anemia, unspecified: Secondary | ICD-10-CM | POA: Diagnosis not present

## 2024-03-06 DIAGNOSIS — M810 Age-related osteoporosis without current pathological fracture: Secondary | ICD-10-CM

## 2024-03-06 NOTE — Progress Notes (Signed)
 Location:  [facility] Room Number: 144 Place of Service:  SNF (31)   CODE STATUS: dnr   No Known Allergies  Chief Complaint  Patient presents with   Medical Management of Chronic Issues        Macrocytic anemia:  Postmenopausal osteoporosis  Vitamin B 12 deficiency    HPI:  She is a 89 y.o. long term resident of this facility being seen for the management of her chronic illnesses:Macrocytic anemia:  Postmenopausal osteoporosis  Vitamin B 12 deficiency. There are no reports of uncontrolled pain. There are no reports of anxiety or depressive thoughts.    Past Medical History:  Diagnosis Date   Anxiety    Arthritis    Asthma    Cancer (HCC)    breast   Depression    Diverticulosis    GERD (gastroesophageal reflux disease)    HTN (hypertension)    Shortness of breath    exertion   Sleep apnea    STOP BANG 4   Thyroid  dysfunction     Past Surgical History:  Procedure Laterality Date   ABDOMINAL HYSTERECTOMY     arthroscopic surgery rt knee  1997   BREAST SURGERY  2001   LEFT/ CANCER    broken radius ulna  1990   left   broken right ankle  1976   EXPLORATORY LAPAROTOMY  08/24/2020   with partial sigmoidectomy creat of end colostomy and application of negative pressure dressing.      JOINT REPLACEMENT  right knee 2005 [place]   left knee  2004   rt foot bone spur removed  1992   THYROID  SURGERY  1983   TOTAL KNEE ARTHROPLASTY  01/22/2012   Procedure: TOTAL KNEE ARTHROPLASTY;  Surgeon: Darrin Emerald, MD;  Location: [facility];  Service: Orthopedics;  Laterality: Left;   TUBAL LIGATION      Social History   Socioeconomic History   Marital status: Widowed    Spouse name: Not on file   Number of children: Not on file   Years of education: Not on file   Highest education level: Not on file  Occupational History   Not on file  Tobacco Use   Smoking status: Former   Smokeless tobacco: Former  Advertising account planner   Vaping status: Never Used   Substance and Sexual Activity   Alcohol use: No   Drug use: No   Sexual activity: Never  Other Topics Concern   Not on file  Social History Narrative   Not on file   Social Drivers of Health   Financial Resource Strain: Not on file  Food Insecurity: Not on file  Transportation Needs: Not on file  Physical Activity: Not on file  Stress: Not on file  Social Connections: Not on file  Intimate Partner Violence: Not on file   Family History  Problem Relation Age of Onset   Pseudochol deficiency Neg Hx    Malignant hyperthermia Neg Hx    Hypotension Neg Hx    Anesthesia problems Neg Hx       VITAL SIGNS BP 136/61   Pulse 70   Temp 98.7 F (37.1 C)   Resp 20   Ht 4' 9 (1.448 m)   Wt 172 lb (78 kg)   SpO2 98%   BMI 37.22 kg/m   Outpatient Encounter Medications as of 03/06/2024  Medication Sig   acetaminophen  (TYLENOL ) 500 MG tablet Take 500 mg by mouth every 8 (eight) hours as needed.  acetaminophen  (TYLENOL ) 500 MG tablet Take 500 mg by mouth at bedtime.   atenolol  (TENORMIN ) 50 MG tablet Take 50 mg by mouth every evening.   BREO ELLIPTA  200-25 MCG/INH AEPB Inhale 1 puff into the lungs daily.   Calcium Carb-Cholecalciferol  (CALCIUM 500 + D PO) Take 1 tablet by mouth daily.   Cholecalciferol  (VITAMIN D3) 50 MCG (2000 UT) TABS Take 1 tablet by mouth daily.   Cyanocobalamin  1000 MCG/ML KIT Inject as directed. Once A Day on the 10th of the Month   folic acid  (FOLVITE ) 1 MG tablet Take 1 mg by mouth daily.   furosemide (LASIX) 20 MG tablet Take 20 mg by mouth daily.   loratadine  (CLARITIN ) 10 MG tablet Take 10 mg by mouth daily.   melatonin 5 MG TABS Take 5 mg by mouth.   Multiple Vitamins-Minerals (PRESERVISION AREDS 2) CAPS Take 1 capsule by mouth in the morning and at bedtime.   NON FORMULARY Diet:Regular   potassium chloride  SA (KLOR-CON ) 20 MEQ tablet Take 40 mEq by mouth 2 (two) times daily.   No facility-administered encounter medications on file as of  03/06/2024.     SIGNIFICANT DIAGNOSTIC EXAMS  LABS REVIEWED PREVIOUS    09-06-23: wbc 4.0; hgb 12.0; hct 37.6; mcv 104.2 plt 191; glucose 75; bun 13; creat 0.61; k+ 4.5; na++ 138; ca 8.6; gfr >60; protein 5.7; albumin 3.0; tsh 1.917; vitamin B 12: 347 10-26-23: folate 3.3 12-04-23: glucose 75; bun 18; creat 0.62; k+ 4.3; na++ 139; ca 9.0; gfr >60  NO NEW LABS.   Review of Systems  Constitutional:  Negative for malaise/fatigue.  Respiratory:  Negative for cough and shortness of breath.   Cardiovascular:  Negative for chest pain, palpitations and leg swelling.  Gastrointestinal:  Negative for abdominal pain, constipation and heartburn.  Musculoskeletal:  Negative for back pain, joint pain and myalgias.  Skin: Negative.   Neurological:  Negative for dizziness.  Psychiatric/Behavioral:  The patient is not nervous/anxious.    Physical Exam Constitutional:      General: She is not in acute distress.    Appearance: She is well-developed. She is obese. She is not diaphoretic.  Neck:     Thyroid : No thyromegaly.   Cardiovascular:     Rate and Rhythm: Normal rate and regular rhythm.     Pulses: Normal pulses.     Heart sounds: Normal heart sounds.  Pulmonary:     Effort: Pulmonary effort is normal. No respiratory distress.     Breath sounds: Normal breath sounds.  Abdominal:     General: Bowel sounds are normal. There is no distension.     Palpations: Abdomen is soft.     Tenderness: There is no abdominal tenderness.     Comments: Colostomy   Musculoskeletal:        General: Normal range of motion.     Cervical back: Neck supple.     Right lower leg: Edema present.     Left lower leg: Edema present.  Lymphadenopathy:     Cervical: No cervical adenopathy.   Skin:    General: Skin is warm and dry.   Neurological:     Mental Status: She is alert. Mental status is at baseline.     Comments: SLUMS: 17/30   Psychiatric:        Mood and Affect: Mood normal.     ASSESSMENT/  PLAN:  TODAY  Macrocytic anemia: hgb 12.2  2. Postmenopausal osteoporosis t score -3.253  3. Vitamin B 12 deficiency:  level 347 is on monthly injections   PREVIOUS   4. Bilateral lower extremity edema: is on lasix 20 mg daily with k+ 40 meq twice daily   5. Vascular dementia without behavioral disturbance: weight is 173 pounds without significant change  6. Non-seasonal allergic rhinitis unspecified trigger: will continue claritin  10 mg daily  7. Essential hypertension: b/p 136/61 will continue tenormin  50 mg daily   8.Hypokalemia: k+ 4.3 will continue 40 meq twice daily   9. Major depression recurrent chronic: is currently taking melatonin nightly for sleep  10. Aortoiliac atherosclerosis (08-21-20) not on statin due to advanced age.   11. Folic acid  deficiency: level 3.3; will continue folic acid  1 mg daily   12. History of large bowel obstruction: status post colostomy  13. Degenerative disc disease, lumbar: will continue tylenol  500 mg nightly   14. Moderate persistent asthma in adult: is on breo ellipta  200/5 mcg 1 puff daily    Check: cbc; cmp   Britt Candle NP [facility]  call [phone]

## 2024-04-09 ENCOUNTER — Non-Acute Institutional Stay (SKILLED_NURSING_FACILITY): Payer: Self-pay | Admitting: Adult Health

## 2024-04-09 ENCOUNTER — Encounter: Payer: Self-pay | Admitting: Adult Health

## 2024-04-09 DIAGNOSIS — R600 Localized edema: Secondary | ICD-10-CM | POA: Diagnosis not present

## 2024-04-09 DIAGNOSIS — J3089 Other allergic rhinitis: Secondary | ICD-10-CM | POA: Diagnosis not present

## 2024-04-09 DIAGNOSIS — F0150 Vascular dementia without behavioral disturbance: Secondary | ICD-10-CM

## 2024-04-09 NOTE — Progress Notes (Signed)
 Location:  [facility] Room Number: 144 Place of Service:  SNF (31)   CODE STATUS: dnr   No Known Allergies  Chief Complaint  Patient presents with   Medical Management of Chronic Issues          Bilateral lower extremity edema:  Vascular dementia without behavioral disturbance: Non-seasonal allergic rhinitis unspecified trigger:    HPI:  She is a 89 y.o. long term resident of this facility being seen for the management of her chronic illnesses:Bilateral lower extremity edema:  Vascular dementia without behavioral disturbance: Non-seasonal allergic rhinitis unspecified trigger. She continues to get out of bed daily. She denies any uncontrolled pain. There are no reports of anxiety or depressive thoughts. Her weight remains stable.    Past Medical History:  Diagnosis Date   Anxiety    Arthritis    Asthma    Cancer (HCC)    breast   Depression    Diverticulosis    GERD (gastroesophageal reflux disease)    HTN (hypertension)    Shortness of breath    exertion   Sleep apnea    STOP BANG 4   Thyroid  dysfunction     Past Surgical History:  Procedure Laterality Date   ABDOMINAL HYSTERECTOMY     arthroscopic surgery rt knee  1997   BREAST SURGERY  2001   LEFT/ CANCER    broken radius ulna  1990   left   broken right ankle  1976   EXPLORATORY LAPAROTOMY  08/24/2020   with partial sigmoidectomy creat of end colostomy and application of negative pressure dressing.      JOINT REPLACEMENT  right knee 2005 [place]   left knee  2004   rt foot bone spur removed  1992   THYROID  SURGERY  1983   TOTAL KNEE ARTHROPLASTY  01/22/2012   Procedure: TOTAL KNEE ARTHROPLASTY;  Surgeon: Taft FORBES Minerva, MD;  Location: [facility];  Service: Orthopedics;  Laterality: Left;   TUBAL LIGATION      Social History   Socioeconomic History   Marital status: Widowed    Spouse name: Not on file   Number of children: Not on file   Years of education: Not on file    Highest education level: Not on file  Occupational History   Not on file  Tobacco Use   Smoking status: Former   Smokeless tobacco: Former  Advertising account planner   Vaping status: Never Used  Substance and Sexual Activity   Alcohol use: No   Drug use: No   Sexual activity: Never  Other Topics Concern   Not on file  Social History Narrative   Not on file   Social Drivers of Health   Financial Resource Strain: Not on file  Food Insecurity: Not on file  Transportation Needs: Not on file  Physical Activity: Not on file  Stress: Not on file  Social Connections: Not on file  Intimate Partner Violence: Not on file   Family History  Problem Relation Age of Onset   Pseudochol deficiency Neg Hx    Malignant hyperthermia Neg Hx    Hypotension Neg Hx    Anesthesia problems Neg Hx       VITAL SIGNS BP 120/72   Pulse 100   Temp (!) 97.3 F (36.3 C)   Resp 20   Ht 4' 9 (1.448 m)   Wt 172 lb 9.6 oz (78.3 kg)   SpO2 94%   BMI 37.35 kg/m   Outpatient Encounter Medications  as of 04/09/2024  Medication Sig   acetaminophen  (TYLENOL ) 500 MG tablet Take 500 mg by mouth every 8 (eight) hours as needed.   acetaminophen  (TYLENOL ) 500 MG tablet Take 500 mg by mouth at bedtime.   atenolol  (TENORMIN ) 50 MG tablet Take 50 mg by mouth every evening.   BREO ELLIPTA  200-25 MCG/INH AEPB Inhale 1 puff into the lungs daily.   Calcium Carb-Cholecalciferol  (CALCIUM 500 + D PO) Take 1 tablet by mouth daily.   Cholecalciferol  (VITAMIN D3) 50 MCG (2000 UT) TABS Take 1 tablet by mouth daily.   Cyanocobalamin  1000 MCG/ML KIT Inject as directed. Once A Day on the 10th of the Month   folic acid  (FOLVITE ) 1 MG tablet Take 1 mg by mouth daily.   furosemide (LASIX) 20 MG tablet Take 20 mg by mouth daily.   loratadine  (CLARITIN ) 10 MG tablet Take 10 mg by mouth daily.   melatonin 5 MG TABS Take 5 mg by mouth.   Multiple Vitamins-Minerals (PRESERVISION AREDS 2) CAPS Take 1 capsule by mouth in the morning and at  bedtime.   NON FORMULARY Diet:Regular   potassium chloride  SA (KLOR-CON ) 20 MEQ tablet Take 40 mEq by mouth 2 (two) times daily.   No facility-administered encounter medications on file as of 04/09/2024.     SIGNIFICANT DIAGNOSTIC EXAMS  LABS REVIEWED PREVIOUS    09-06-23: wbc 4.0; hgb 12.0; hct 37.6; mcv 104.2 plt 191; glucose 75; bun 13; creat 0.61; k+ 4.5; na++ 138; ca 8.6; gfr >60; protein 5.7; albumin 3.0; tsh 1.917; vitamin B 12: 347 10-26-23: folate 3.3 12-04-23: glucose 75; bun 18; creat 0.62; k+ 4.3; na++ 139; ca 9.0; gfr >60  NO NEW LABS.   Review of Systems  Constitutional:  Negative for malaise/fatigue.  Respiratory:  Negative for cough and shortness of breath.   Cardiovascular:  Negative for chest pain, palpitations and leg swelling.  Gastrointestinal:  Negative for abdominal pain, constipation and heartburn.  Musculoskeletal:  Negative for back pain, joint pain and myalgias.  Skin: Negative.   Neurological:  Negative for dizziness.  Psychiatric/Behavioral:  The patient is not nervous/anxious.    Physical Exam Constitutional:      General: She is not in acute distress.    Appearance: She is well-developed. She is obese. She is not diaphoretic.  Neck:     Thyroid : No thyromegaly.  Cardiovascular:     Rate and Rhythm: Normal rate and regular rhythm.     Heart sounds: Normal heart sounds.  Pulmonary:     Effort: Pulmonary effort is normal. No respiratory distress.     Breath sounds: Normal breath sounds.  Abdominal:     General: Bowel sounds are normal. There is no distension.     Palpations: Abdomen is soft.     Tenderness: There is no abdominal tenderness.     Comments: colostomy  Musculoskeletal:        General: Normal range of motion.     Cervical back: Neck supple.     Right lower leg: Edema present.     Left lower leg: Edema present.  Lymphadenopathy:     Cervical: No cervical adenopathy.  Skin:    General: Skin is warm and dry.  Neurological:      Mental Status: She is alert. Mental status is at baseline.     Comments: SLUMS: 17/30  Psychiatric:        Mood and Affect: Mood normal.      ASSESSMENT/ PLAN:  TODAY  Bilateral lower  extremity edema: will continue lasix 20 mg daily with k+ 40 meq twice daily   2. Vascular dementia without behavioral disturbance: weight is 172 and is stable.   3. Non-seasonal allergic rhinitis unspecified trigger: will continue claritin  10 mg daily   PREVIOUS   4. Essential hypertension: b/p 120/72 will continue tenormin  50 mg daily   5.Hypokalemia: k+ 4.3 will continue 40 meq twice daily   6. Major depression recurrent chronic: is currently taking melatonin nightly for sleep  7. Aortoiliac atherosclerosis (08-21-20) not on statin due to advanced age.   8. Folic acid  deficiency: level 3.3; will continue folic acid  1 mg daily   9. History of large bowel obstruction: status post colostomy  10. Degenerative disc disease, lumbar: will continue tylenol  500 mg nightly   11. Moderate persistent asthma in adult: is on breo ellipta  200/5 mcg 1 puff daily    12. Macrocytic anemia: hgb 12.2  13. Postmenopausal osteoporosis t score -3.253  14. Vitamin B 12 deficiency: level 347 is on monthly injections   Will check cbc; cmp   Barnie Seip NP [facility]  call [phone]

## 2024-04-14 ENCOUNTER — Other Ambulatory Visit (HOSPITAL_COMMUNITY)
Admission: RE | Admit: 2024-04-14 | Discharge: 2024-04-14 | Disposition: A | Discharge status: Home / Self Care | Source: Skilled Nursing Facility | Attending: Adult Health | Admitting: Adult Health

## 2024-04-14 DIAGNOSIS — I129 Hypertensive chronic kidney disease with stage 1 through stage 4 chronic kidney disease, or unspecified chronic kidney disease: Secondary | ICD-10-CM | POA: Insufficient documentation

## 2024-04-14 LAB — CBC
HCT: 39.3 % (ref 36.0–46.0)
Hemoglobin: 12.5 g/dL (ref 12.0–15.0)
MCH: 33.5 pg (ref 26.0–34.0)
MCHC: 31.8 g/dL (ref 30.0–36.0)
MCV: 105.4 fL — ABNORMAL HIGH (ref 80.0–100.0)
Platelets: 188 K/uL (ref 150–400)
RBC: 3.73 MIL/uL — ABNORMAL LOW (ref 3.87–5.11)
RDW: 12.8 % (ref 11.5–15.5)
WBC: 4.6 K/uL (ref 4.0–10.5)
nRBC: 0 % (ref 0.0–0.2)

## 2024-04-14 LAB — COMPREHENSIVE METABOLIC PANEL WITH GFR
ALT: 9 U/L (ref 0–44)
AST: 14 U/L — ABNORMAL LOW (ref 15–41)
Albumin: 3 g/dL — ABNORMAL LOW (ref 3.5–5.0)
Alkaline Phosphatase: 51 U/L (ref 38–126)
Anion gap: 8 (ref 5–15)
BUN: 19 mg/dL (ref 8–23)
CO2: 24 mmol/L (ref 22–32)
Calcium: 8.6 mg/dL — ABNORMAL LOW (ref 8.9–10.3)
Chloride: 107 mmol/L (ref 98–111)
Creatinine, Ser: 0.63 mg/dL (ref 0.44–1.00)
GFR, Estimated: >60 mL/min (ref >60)
Glucose, Bld: 79 mg/dL (ref 70–99)
Potassium: 4.3 mmol/L (ref 3.5–5.1)
Sodium: 139 mmol/L (ref 135–145)
Total Bilirubin: 0.6 mg/dL (ref 0.0–1.2)
Total Protein: 5.8 g/dL — ABNORMAL LOW (ref 6.5–8.1)

## 2024-05-02 ENCOUNTER — Non-Acute Institutional Stay (SKILLED_NURSING_FACILITY): Payer: Self-pay | Admitting: Internal Medicine

## 2024-05-02 ENCOUNTER — Encounter: Payer: Self-pay | Admitting: Internal Medicine

## 2024-05-02 DIAGNOSIS — I10 Essential (primary) hypertension: Secondary | ICD-10-CM | POA: Diagnosis not present

## 2024-05-02 DIAGNOSIS — E440 Moderate protein-calorie malnutrition: Secondary | ICD-10-CM | POA: Diagnosis not present

## 2024-05-02 DIAGNOSIS — D539 Nutritional anemia, unspecified: Secondary | ICD-10-CM

## 2024-05-02 NOTE — Assessment & Plan Note (Addendum)
 Anemia has resolved and in fact H/H have increased from 12/37.6 up to 12.5/39.3.  Despite this macrocytosis has progress ed with current MCV of 105.4.  Folic acid  level was low at 3.3 on 10/26/2023; folate 1 mg supplementation daily was initiated 5/1.  Folic acid  level should be rechecked in the next 3 months.

## 2024-05-02 NOTE — Patient Instructions (Signed)
 See assessment and plan under each diagnosis in the problem list and acutely for this visit

## 2024-05-02 NOTE — Assessment & Plan Note (Signed)
 Albumin remains stable at 3.0; total protein is minimally changed at 5.8, up from 5.7.  Nutritionist continues to follow.  As noted she is on folate supplement as of 01/24/2024.

## 2024-05-02 NOTE — Assessment & Plan Note (Signed)
 BP controlled; no change in antihypertensive medications

## 2024-05-02 NOTE — Progress Notes (Signed)
 NURSING HOME LOCATION:  [facility] ROOM NUMBER: [id number]   CODE STATUS:  DNR  PCP: Landy Barnie RAMAN, NP   This is a nursing facility follow up visit of chronic medical diagnoses to document compliance with Regulation 483.30 (c) in The Long Term Care Survey Manual Phase 2 which mandates caregiver visit ( visits can alternate among physician, PA or NP as per statutes) within 10 days of 30 days / 60 days/ 90 days post admission to SNF date  .  Interim medical record and care since last SNF visit was updated with review of diagnostic studies and change in clinical status since last visit were documented.  HPI: She is a permanent resident of this facility with medical diagnoses of history of diverticulosis, GERD, essential hypertension, OSA, thyroid  dysfunction, history of asthma, history of breast cancer and history of anxiety/depression. Surgeries include colostomy for large bowel obstruction as well as breast and thyroid  surgical procedures without further clarification. Labs are current as of 04/14/2024.  She has developed slight hypocalcemia with a value of 8.6, down from 9.0. Albumin remains at 3.0; total protein is minimally changed at 5.8, up from 5.7.  She continues to exhibit significant macrocytosis with an MCV of 105.4 in the absence of associated anemia.  In fact her hemoglobin/ hematocrit have increased from 12/37.6 up to 12.5/39.3.  B12 level was normal at 341 on 09/06/2023.  She is on oral B12 supplement.  Folic acid  was low at 3.3 on 10/26/2023.  Folate 1 mg was initiated 01/24/2024.  Review of systems: She states I am doing all right, I guess.  She denies any significant symptoms.  She states that sometimes she does not sleep well.  Typically she will go to sleep at 12 midnight and awaken at 6:30 am.  Constitutional: No fever, significant weight change, fatigue  Eyes: No redness, discharge, pain, vision change ENT/mouth: No nasal congestion,  purulent discharge,  earache, change in hearing, sore throat  Cardiovascular: No chest pain, palpitations, paroxysmal nocturnal dyspnea, edema  Respiratory: No cough, sputum production, hemoptysis, DOE, significant snoring, apnea   Gastrointestinal: No heartburn, dysphagia, abdominal pain, nausea /vomiting, rectal bleeding, melena, change in bowels Genitourinary: No dysuria, hematuria, pyuria, incontinence, nocturia Musculoskeletal: No joint stiffness, joint swelling, weakness, pain Dermatologic: No rash, pruritus, change in appearance of skin Neurologic: No dizziness, headache, syncope, seizures, numbness, tingling Psychiatric: No significant anxiety, depression, anorexia Endocrine: No change in hair/skin/nails, excessive thirst, excessive hunger, excessive urination  Hematologic/lymphatic: No significant bruising, lymphadenopathy, abnormal bleeding Allergy/immunology: No itchy/watery eyes, significant sneezing, urticaria, angioedema  Physical exam:  Pertinent or positive findings: She is nonambulatory, either bedridden or in the wheelchair  .  She is extremely hard of hearing.  She has slight bilateral ptosis.  Complete dentures are present.  Abdomen is markedly protuberant.  Ostomy is present.  Pedal pulses are not palpable.  She has 1+ edema at the ankles.  She has DIP osteoarthritic changes with some deviation of the joints.  Fusiform enlargement of the knees are present.  Deep tendon reflexes are 0-1/2+ @ the knees and 1+ at the biceps.  General appearance: Adequately nourished; no acute distress, increased work of breathing is present.   Lymphatic: No lymphadenopathy about the head, neck, axilla. Eyes: No conjunctival inflammation or lid edema is present. There is no scleral icterus. Ears:  External ear exam shows no significant lesions or deformities.   Nose:  External nasal examination shows no deformity or inflammation. Nasal mucosa are pink and  moist without lesions, exudates Neck:  No thyromegaly,  masses, tenderness noted.    Heart:  Normal rate and regular rhythm. S1 and S2 normal without gallop, murmur, click, rub .  Lungs: Chest clear to auscultation without wheezes, rhonchi, rales, rubs. Abdomen: Bowel sounds are normal. Abdomen is soft and nontender with no organomegaly, hernias, masses. GU: Deferred  Extremities:  No cyanosis, clubbing  Neurologic exam :Balance, Rhomberg, finger to nose testing could not be completed due to clinical state Skin: Warm & dry w/o tenting. No significant lesions or rash.  See summary under each active problem in the Problem List with associated updated therapeutic plan

## 2024-05-23 ENCOUNTER — Non-Acute Institutional Stay (SKILLED_NURSING_FACILITY): Payer: Self-pay | Admitting: Adult Health

## 2024-05-23 ENCOUNTER — Encounter: Payer: Self-pay | Admitting: Adult Health

## 2024-05-23 DIAGNOSIS — I708 Atherosclerosis of other arteries: Secondary | ICD-10-CM

## 2024-05-23 DIAGNOSIS — I700 Atherosclerosis of aorta: Secondary | ICD-10-CM

## 2024-05-23 DIAGNOSIS — F0150 Vascular dementia without behavioral disturbance: Secondary | ICD-10-CM

## 2024-05-23 NOTE — Progress Notes (Signed)
 Location:  [facility] Room Number: 144 P Place of Service:  SNF (31)   CODE STATUS: DNR  No Known Allergies  Chief Complaint  Patient presents with   Care Plan Meeting     HPI:  We have come together for her care plan meeting. BIMS 13/15 mood 2/30: trouble concentrating, decreased energy. Uses wheelchair without falls. She requires independent assist with her adl care. She is frequently incontinent of bladder and has colostomy with self care. Dietary: regular diet; feeds self; appetite 51-100%; weight is 175.2 pounds. Activities: does puzzles. Therapy none at this time. She will continue to be followed for her chronic illnesses including: Aorto-iliac atherosclerosis   Vascular dementia without behavioral disturbance  Obesity morbid  Past Medical History:  Diagnosis Date   Anxiety    Arthritis    Asthma    Cancer (HCC)    breast   Depression    Diverticulosis    GERD (gastroesophageal reflux disease)    HTN (hypertension)    Shortness of breath    exertion   Sleep apnea    STOP BANG 4   Thyroid  dysfunction     Past Surgical History:  Procedure Laterality Date   ABDOMINAL HYSTERECTOMY     arthroscopic surgery rt knee  1997   BREAST SURGERY  2001   LEFT/ CANCER    broken radius ulna  1990   left   broken right ankle  1976   EXPLORATORY LAPAROTOMY  08/24/2020   with partial sigmoidectomy creat of end colostomy and application of negative pressure dressing.      JOINT REPLACEMENT  right knee 2005 [place]   left knee  2004   rt foot bone spur removed  1992   THYROID  SURGERY  1983   TOTAL KNEE ARTHROPLASTY  01/22/2012   Procedure: TOTAL KNEE ARTHROPLASTY;  Surgeon: Taft FORBES Minerva, MD;  Location: [facility];  Service: Orthopedics;  Laterality: Left;   TUBAL LIGATION      Social History   Socioeconomic History   Marital status: Widowed    Spouse name: Not on file   Number of children: Not on file   Years of education: Not on file   Highest  education level: Not on file  Occupational History   Not on file  Tobacco Use   Smoking status: Former   Smokeless tobacco: Former  Advertising account planner   Vaping status: Never Used  Substance and Sexual Activity   Alcohol use: No   Drug use: No   Sexual activity: Never  Other Topics Concern   Not on file  Social History Narrative   Not on file   Social Drivers of Health   Financial Resource Strain: Not on file  Food Insecurity: Not on file  Transportation Needs: Not on file  Physical Activity: Not on file  Stress: Not on file  Social Connections: Not on file  Intimate Partner Violence: Not on file   Family History  Problem Relation Age of Onset   Pseudochol deficiency Neg Hx    Malignant hyperthermia Neg Hx    Hypotension Neg Hx    Anesthesia problems Neg Hx       VITAL SIGNS BP 127/61   Pulse 74   Temp 97.9 F (36.6 C)   Resp 18   Ht 4' 5 (1.346 m)   Wt 175 lb 3.2 oz (79.5 kg)   SpO2 97%   BMI 43.85 kg/m   Outpatient Encounter Medications as of 05/23/2024  Medication  Sig   acetaminophen  (TYLENOL ) 500 MG tablet Take 500 mg by mouth 3 (three) times daily.   Amino Acids-Protein Hydrolys (FEEDING SUPPLEMENT, PRO-STAT SUGAR FREE 64,) LIQD Take 30 mLs by mouth 3 (three) times daily with meals.   atenolol  (TENORMIN ) 50 MG tablet Take 50 mg by mouth every evening.   BREO ELLIPTA  200-25 MCG/INH AEPB Inhale 1 puff into the lungs daily.   Calcium Carb-Cholecalciferol  (CALCIUM 500 + D PO) Take 1 tablet by mouth daily.   Cholecalciferol  (VITAMIN D3) 50 MCG (2000 UT) TABS Take 1 tablet by mouth daily.   Cyanocobalamin  1000 MCG/ML KIT Inject as directed. Once A Day on the 10th of the Month   folic acid  (FOLVITE ) 1 MG tablet Take 1 mg by mouth daily.   furosemide (LASIX) 20 MG tablet Take 20 mg by mouth daily.   melatonin 5 MG TABS Take 5 mg by mouth.   Multiple Vitamins-Minerals (PRESERVISION AREDS 2) CAPS Take 1 capsule by mouth in the morning and at bedtime.   NON FORMULARY  Diet:Regular   potassium chloride  SA (KLOR-CON ) 20 MEQ tablet Take 40 mEq by mouth 2 (two) times daily.   acetaminophen  (TYLENOL ) 500 MG tablet Take 500 mg by mouth at bedtime. (Patient not taking: Reported on 05/23/2024)   loratadine  (CLARITIN ) 10 MG tablet Take 10 mg by mouth daily. (Patient not taking: Reported on 05/23/2024)   No facility-administered encounter medications on file as of 05/23/2024.     SIGNIFICANT DIAGNOSTIC EXAMS  LABS REVIEWED PREVIOUS    09-06-23: wbc 4.0; hgb 12.0; hct 37.6; mcv 104.2 plt 191; glucose 75; bun 13; creat 0.61; k+ 4.5; na++ 138; ca 8.6; gfr >60; protein 5.7; albumin 3.0; tsh 1.917; vitamin B 12: 347 10-26-23: folate 3.3 12-04-23: glucose 75; bun 18; creat 0.62; k+ 4.3; na++ 139; ca 9.0; gfr >60  NO NEW LABS.   Review of Systems  Constitutional:  Negative for malaise/fatigue.  Respiratory:  Negative for cough and shortness of breath.   Cardiovascular:  Negative for chest pain, palpitations and leg swelling.  Gastrointestinal:  Negative for abdominal pain, constipation and heartburn.  Musculoskeletal:  Negative for back pain, joint pain and myalgias.  Skin: Negative.   Neurological:  Negative for dizziness.  Psychiatric/Behavioral:  The patient is not nervous/anxious.     Physical Exam Constitutional:      General: She is not in acute distress.    Appearance: She is well-developed. She is obese. She is not diaphoretic.  Neck:     Thyroid : No thyromegaly.  Cardiovascular:     Rate and Rhythm: Normal rate and regular rhythm.     Heart sounds: Normal heart sounds.  Pulmonary:     Effort: Pulmonary effort is normal. No respiratory distress.     Breath sounds: Normal breath sounds.  Abdominal:     General: Bowel sounds are normal. There is no distension.     Palpations: Abdomen is soft.     Tenderness: There is no abdominal tenderness.     Comments: colostomy  Musculoskeletal:        General: Normal range of motion.     Cervical back: Neck  supple.     Right lower leg: No edema.     Left lower leg: No edema.  Lymphadenopathy:     Cervical: No cervical adenopathy.  Skin:    General: Skin is warm and dry.  Neurological:     Mental Status: She is alert. Mental status is at baseline.  Psychiatric:  Mood and Affect: Mood normal.     ASSESSMENT/ PLAN:  TODAY  Aorto-iliac atherosclerosis Vascular dementia without behavioral disturbance Obesity morbid  Will continue current medications Will continue current plan of care Will continue to monitor her status  Time spent with patient: 40 minutes: medications; dietary; activities.    Barnie Seip NP [facility]  call [phone]

## 2024-05-28 ENCOUNTER — Encounter: Payer: Self-pay | Admitting: Adult Health

## 2024-05-28 ENCOUNTER — Non-Acute Institutional Stay (SKILLED_NURSING_FACILITY): Payer: Self-pay | Admitting: Adult Health

## 2024-05-28 DIAGNOSIS — E876 Hypokalemia: Secondary | ICD-10-CM

## 2024-05-28 DIAGNOSIS — I10 Essential (primary) hypertension: Secondary | ICD-10-CM

## 2024-05-28 DIAGNOSIS — F339 Major depressive disorder, recurrent, unspecified: Secondary | ICD-10-CM

## 2024-05-28 NOTE — Progress Notes (Signed)
 Location:  [facility] Room Number: 144 Place of Service:  SNF (31)   CODE STATUS: DNR  No Known Allergies  Chief Complaint  Patient presents with   Medical Management of Chronic Issues        Essential hypertension:    Hypokalemia:    Major depression recurrent chronic    HPI:  She is a 89 y.o. long term resident of this facility being seen for the management of her chronic illnesses:  Essential hypertension:    Hypokalemia:    Major depression recurrent chronic. There are no reports of uncontrolled pain. There are no reports of anxiety present. She continues to get out of bed daily.    Past Medical History:  Diagnosis Date   Anxiety    Arthritis    Asthma    Cancer (HCC)    breast   Depression    Diverticulosis    GERD (gastroesophageal reflux disease)    HTN (hypertension)    Shortness of breath    exertion   Sleep apnea    STOP BANG 4   Thyroid  dysfunction     Past Surgical History:  Procedure Laterality Date   ABDOMINAL HYSTERECTOMY     arthroscopic surgery rt knee  1997   BREAST SURGERY  2001   LEFT/ CANCER    broken radius ulna  1990   left   broken right ankle  1976   EXPLORATORY LAPAROTOMY  08/24/2020   with partial sigmoidectomy creat of end colostomy and application of negative pressure dressing.      JOINT REPLACEMENT  right knee 2005 [place]   left knee  2004   rt foot bone spur removed  1992   THYROID  SURGERY  1983   TOTAL KNEE ARTHROPLASTY  01/22/2012   Procedure: TOTAL KNEE ARTHROPLASTY;  Surgeon: Taft FORBES Minerva, MD;  Location: [facility];  Service: Orthopedics;  Laterality: Left;   TUBAL LIGATION      Social History   Socioeconomic History   Marital status: Widowed    Spouse name: Not on file   Number of children: Not on file   Years of education: Not on file   Highest education level: Not on file  Occupational History   Not on file  Tobacco Use   Smoking status: Former   Smokeless tobacco: Former  Theatre manager   Vaping status: Never Used  Substance and Sexual Activity   Alcohol use: No   Drug use: No   Sexual activity: Never  Other Topics Concern   Not on file  Social History Narrative   Not on file   Social Drivers of Health   Financial Resource Strain: Not on file  Food Insecurity: Not on file  Transportation Needs: Not on file  Physical Activity: Not on file  Stress: Not on file  Social Connections: Not on file  Intimate Partner Violence: Not on file   Family History  Problem Relation Age of Onset   Pseudochol deficiency Neg Hx    Malignant hyperthermia Neg Hx    Hypotension Neg Hx    Anesthesia problems Neg Hx       VITAL SIGNS BP 117/66   Pulse 63   Temp 97.6 F (36.4 C)   Resp 19   Ht 4' 5 (1.346 m)   Wt 175 lb 3.2 oz (79.5 kg)   SpO2 98%   BMI 43.85 kg/m   Outpatient Encounter Medications as of 05/28/2024  Medication Sig   acetaminophen  (TYLENOL )  650 MG CR tablet Take 650 mg by mouth 3 (three) times daily.   Amino Acids-Protein Hydrolys (FEEDING SUPPLEMENT, PRO-STAT SUGAR FREE 64,) LIQD Take 30 mLs by mouth 3 (three) times daily with meals.   atenolol  (TENORMIN ) 50 MG tablet Take 50 mg by mouth every evening.   BREO ELLIPTA  200-25 MCG/INH AEPB Inhale 1 puff into the lungs daily.   Calcium Carb-Cholecalciferol  (CALCIUM 500 + D PO) Take 1 tablet by mouth daily.   Cholecalciferol  (VITAMIN D3) 50 MCG (2000 UT) TABS Take 1 tablet by mouth daily.   Cyanocobalamin  1000 MCG/ML KIT Inject as directed. Once A Day on the 10th of the Month   folic acid  (FOLVITE ) 1 MG tablet Take 1 mg by mouth daily.   furosemide (LASIX) 20 MG tablet Take 20 mg by mouth daily.   melatonin 5 MG TABS Take 5 mg by mouth.   Multiple Vitamins-Minerals (PRESERVISION AREDS 2) CAPS Take 1 capsule by mouth in the morning and at bedtime.   NON FORMULARY Diet:Regular   potassium chloride  SA (KLOR-CON ) 20 MEQ tablet Take 40 mEq by mouth 2 (two) times daily.   acetaminophen  (TYLENOL ) 500 MG tablet  Take 500 mg by mouth 3 (three) times daily. (Patient not taking: Reported on 05/28/2024)   acetaminophen  (TYLENOL ) 500 MG tablet Take 500 mg by mouth at bedtime. (Patient not taking: Reported on 05/28/2024)   loratadine  (CLARITIN ) 10 MG tablet Take 10 mg by mouth daily. (Patient not taking: Reported on 05/28/2024)   No facility-administered encounter medications on file as of 05/28/2024.     SIGNIFICANT DIAGNOSTIC EXAMS  LABS REVIEWED PREVIOUS    09-06-23: wbc 4.0; hgb 12.0; hct 37.6; mcv 104.2 plt 191; glucose 75; bun 13; creat 0.61; k+ 4.5; na++ 138; ca 8.6; gfr >60; protein 5.7; albumin 3.0; tsh 1.917; vitamin B 12: 347 10-26-23: folate 3.3 12-04-23: glucose 75; bun 18; creat 0.62; k+ 4.3; na++ 139; ca 9.0; gfr >60  TODAY  04-14-24: wbc 4.6; hgb 12.5; hct 39.3; mcv 105.4 plt 188; glucose 79; bun 19; creat 0.63; k+ 4.3 na++ 139; ca 8.6; gfr >60; protein 5.8 albumin 3.0   Review of Systems  Constitutional:  Negative for malaise/fatigue.  Respiratory:  Negative for cough and shortness of breath.   Cardiovascular:  Negative for chest pain, palpitations and leg swelling.  Gastrointestinal:  Negative for abdominal pain, constipation and heartburn.  Musculoskeletal:  Negative for back pain, joint pain and myalgias.  Skin: Negative.   Neurological:  Negative for dizziness.  Psychiatric/Behavioral:  The patient is not nervous/anxious.     Physical Exam Constitutional:      General: She is not in acute distress.    Appearance: She is well-developed. She is obese. She is not diaphoretic.  Neck:     Thyroid : No thyromegaly.  Cardiovascular:     Rate and Rhythm: Normal rate and regular rhythm.     Heart sounds: Normal heart sounds.  Pulmonary:     Effort: Pulmonary effort is normal. No respiratory distress.     Breath sounds: Normal breath sounds.  Abdominal:     General: Bowel sounds are normal. There is no distension.     Palpations: Abdomen is soft.     Tenderness: There is no abdominal  tenderness.     Comments: colostomy  Musculoskeletal:        General: Normal range of motion.     Cervical back: Neck supple.     Right lower leg: No edema.  Left lower leg: No edema.  Lymphadenopathy:     Cervical: No cervical adenopathy.  Skin:    General: Skin is warm and dry.  Neurological:     Mental Status: She is alert. Mental status is at baseline.     Comments: SLUMS 17/30  Psychiatric:        Mood and Affect: Mood normal.        ASSESSMENT/ PLAN:  TODAY  Essential hypertension: b/p 117/66 will continue temormin 50 mg daily  2. Hypokalemia: k+ 4.3 will continue 40 meq twice daily   3. Major depression recurrent chronic: is presently taking melatonin nightly for sleep   PREVIOUS   4. Aortoiliac atherosclerosis (08-21-20) not on statin due to advanced age.   5. Folic acid  deficiency: level 3.3; will continue folic acid  1 mg daily   6. History of large bowel obstruction: status post colostomy  7. Degenerative disc disease, lumbar: will continue tylenol  500 mg nightly   8. Moderate persistent asthma in adult: is on breo ellipta  200/5 mcg 1 puff daily    9. Macrocytic anemia: hgb 12.2  10. Postmenopausal osteoporosis t score -3.253  11. Vitamin B 12 deficiency: level 347 is on monthly injections   12. Bilateral lower extremity edema: will continue lasix 20 mg daily with k+ 40 meq twice daily   13. Vascular dementia without behavioral disturbance: weight is 175 and is stable.   14. Non-seasonal allergic rhinitis unspecified trigger: will continue claritin  10 mg daily     Barnie Seip NP [facility]   call [phone]

## 2024-07-01 ENCOUNTER — Encounter: Payer: Self-pay | Admitting: Adult Health

## 2024-07-01 ENCOUNTER — Non-Acute Institutional Stay (SKILLED_NURSING_FACILITY): Payer: Self-pay | Admitting: Adult Health

## 2024-07-01 DIAGNOSIS — I700 Atherosclerosis of aorta: Secondary | ICD-10-CM | POA: Diagnosis not present

## 2024-07-01 DIAGNOSIS — K56609 Unspecified intestinal obstruction, unspecified as to partial versus complete obstruction: Secondary | ICD-10-CM | POA: Diagnosis not present

## 2024-07-01 DIAGNOSIS — I708 Atherosclerosis of other arteries: Secondary | ICD-10-CM

## 2024-07-01 DIAGNOSIS — E538 Deficiency of other specified B group vitamins: Secondary | ICD-10-CM

## 2024-07-01 NOTE — Progress Notes (Unsigned)
 Location:  [facility] Room Number: 144 Place of Service:  SNF (31)   CODE STATUS: dnr   No Known Allergies  Chief Complaint  Patient presents with   Medical Management of Chronic Issues          Aortoiliac atherosclerosis    Folic acid  deficiency:   History of large bowel obstruction    HPI:  She is a 89 y.o. long term resident of this facility being seen for the management of her chronic illnesses:Aortoiliac atherosclerosis    Folic acid  deficiency:   History of large bowel obstruction. There are no reports of uncontrolled pain. She continues to remain fairly independent with her adl care. Her weight remains stable.    Past Medical History:  Diagnosis Date   Anxiety    Arthritis    Asthma    Cancer (HCC)    breast   Depression    Diverticulosis    GERD (gastroesophageal reflux disease)    HTN (hypertension)    Shortness of breath    exertion   Sleep apnea    STOP BANG 4   Thyroid  dysfunction     Past Surgical History:  Procedure Laterality Date   ABDOMINAL HYSTERECTOMY     arthroscopic surgery rt knee  1997   BREAST SURGERY  2001   LEFT/ CANCER    broken radius ulna  1990   left   broken right ankle  1976   EXPLORATORY LAPAROTOMY  08/24/2020   with partial sigmoidectomy creat of end colostomy and application of negative pressure dressing.      JOINT REPLACEMENT  right knee 2005 [place]   left knee  2004   rt foot bone spur removed  1992   THYROID  SURGERY  1983   TOTAL KNEE ARTHROPLASTY  01/22/2012   Procedure: TOTAL KNEE ARTHROPLASTY;  Surgeon: Taft FORBES Minerva, MD;  Location: [facility];  Service: Orthopedics;  Laterality: Left;   TUBAL LIGATION      Social History   Socioeconomic History   Marital status: Widowed    Spouse name: Not on file   Number of children: Not on file   Years of education: Not on file   Highest education level: Not on file  Occupational History   Not on file  Tobacco Use   Smoking status: Former    Smokeless tobacco: Former  Advertising account planner   Vaping status: Never Used  Substance and Sexual Activity   Alcohol use: No   Drug use: No   Sexual activity: Never  Other Topics Concern   Not on file  Social History Narrative   Not on file   Social Drivers of Health   Financial Resource Strain: Not on file  Food Insecurity: Not on file  Transportation Needs: Not on file  Physical Activity: Not on file  Stress: Not on file  Social Connections: Not on file  Intimate Partner Violence: Not on file   Family History  Problem Relation Age of Onset   Pseudochol deficiency Neg Hx    Malignant hyperthermia Neg Hx    Hypotension Neg Hx    Anesthesia problems Neg Hx       VITAL SIGNS BP 107/82   Pulse 87   Temp (!) 97.3 F (36.3 C)   Resp 18   Ht 4' 9 (1.448 m)   Wt 175 lb 6.4 oz (79.6 kg)   SpO2 96%   BMI 37.96 kg/m   Outpatient Encounter Medications as of 07/01/2024  Medication  Sig   acetaminophen  (TYLENOL ) 500 MG tablet Take 500 mg by mouth 3 (three) times daily. (Patient not taking: Reported on 05/28/2024)   acetaminophen  (TYLENOL ) 500 MG tablet Take 500 mg by mouth at bedtime. (Patient not taking: Reported on 05/28/2024)   acetaminophen  (TYLENOL ) 650 MG CR tablet Take 650 mg by mouth 3 (three) times daily.   Amino Acids-Protein Hydrolys (FEEDING SUPPLEMENT, PRO-STAT SUGAR FREE 64,) LIQD Take 30 mLs by mouth 3 (three) times daily with meals.   atenolol  (TENORMIN ) 50 MG tablet Take 50 mg by mouth every evening.   BREO ELLIPTA  200-25 MCG/INH AEPB Inhale 1 puff into the lungs daily.   Calcium Carb-Cholecalciferol  (CALCIUM 500 + D PO) Take 1 tablet by mouth daily.   Cholecalciferol  (VITAMIN D3) 50 MCG (2000 UT) TABS Take 1 tablet by mouth daily.   Cyanocobalamin  1000 MCG/ML KIT Inject as directed. Once A Day on the 10th of the Month   folic acid  (FOLVITE ) 1 MG tablet Take 1 mg by mouth daily.   furosemide (LASIX) 20 MG tablet Take 20 mg by mouth daily.   loratadine  (CLARITIN ) 10 MG  tablet Take 10 mg by mouth daily. (Patient not taking: Reported on 05/28/2024)   melatonin 5 MG TABS Take 5 mg by mouth.   Multiple Vitamins-Minerals (PRESERVISION AREDS 2) CAPS Take 1 capsule by mouth in the morning and at bedtime.   NON FORMULARY Diet:Regular   potassium chloride  SA (KLOR-CON ) 20 MEQ tablet Take 40 mEq by mouth 2 (two) times daily.   No facility-administered encounter medications on file as of 07/01/2024.     SIGNIFICANT DIAGNOSTIC EXAMS  LABS REVIEWED PREVIOUS    09-06-23: wbc 4.0; hgb 12.0; hct 37.6; mcv 104.2 plt 191; glucose 75; bun 13; creat 0.61; k+ 4.5; na++ 138; ca 8.6; gfr >60; protein 5.7; albumin 3.0; tsh 1.917; vitamin B 12: 347 10-26-23: folate 3.3 12-04-23: glucose 75; bun 18; creat 0.62; k+ 4.3; na++ 139; ca 9.0; gfr >60  TODAY  04-14-24: wbc 4.6; hgb 12.5; hct 39.3; mcv 105.4 plt 188; glucose 79; bun 19; creat 0.63; k+ 4.3 na++ 139; ca 8.6; gfr >60; protein 5.8 albumin 3.0   Review of Systems  Constitutional:  Negative for malaise/fatigue.  Respiratory:  Negative for cough and shortness of breath.   Cardiovascular:  Negative for chest pain, palpitations and leg swelling.  Gastrointestinal:  Negative for abdominal pain, constipation and heartburn.  Musculoskeletal:  Negative for back pain, joint pain and myalgias.  Skin: Negative.   Neurological:  Negative for dizziness.  Psychiatric/Behavioral:  The patient is not nervous/anxious.     Physical Exam Constitutional:      General: She is not in acute distress.    Appearance: She is well-developed. She is obese. She is not diaphoretic.  Neck:     Thyroid : No thyromegaly.  Cardiovascular:     Rate and Rhythm: Normal rate and regular rhythm.     Heart sounds: Normal heart sounds.  Pulmonary:     Effort: Pulmonary effort is normal. No respiratory distress.     Breath sounds: Normal breath sounds.  Abdominal:     General: Bowel sounds are normal. There is no distension.     Palpations: Abdomen is  soft.     Tenderness: There is no abdominal tenderness.     Comments: colostomy  Musculoskeletal:        General: Normal range of motion.     Cervical back: Neck supple.     Right lower leg: No  edema.     Left lower leg: No edema.  Lymphadenopathy:     Cervical: No cervical adenopathy.  Skin:    General: Skin is warm and dry.  Neurological:     Mental Status: She is alert. Mental status is at baseline.     Comments: SLUMS 17/30  Psychiatric:        Mood and Affect: Mood normal.       ASSESSMENT/ PLAN:  TODAY  Aortoiliac atherosclerosis (08-21-20) is not on status due to her advanced age  52. Folic acid  deficiency: level 3.3; will continue 1 mg daily supplement  3. History of large bowel obstruction: is status post colostomy   PREVIOUS   4. Degenerative disc disease, lumbar: will continue tylenol  500 mg nightly   5. Moderate persistent asthma in adult: is on breo ellipta  200/5 mcg 1 puff daily    6. Macrocytic anemia: hgb 12.2  7. Postmenopausal osteoporosis t score -3.253  8. Vitamin B 12 deficiency: level 347 is on monthly injections   9. Bilateral lower extremity edema: will continue lasix 20 mg daily with k+ 40 meq twice daily   10. Vascular dementia without behavioral disturbance: weight is 175 and is stable.   11. Non-seasonal allergic rhinitis unspecified trigger: will continue claritin  10 mg daily   12. Essential hypertension: b/p 107/82 will continue temormin 50 mg daily  13. Hypokalemia: k+ 4.3 will continue 40 meq twice daily   14. Major depression recurrent chronic: is presently taking melatonin nightly for sleep       Barnie Seip NP [facility] Adult [facility]  call [phone]

## 2024-08-01 ENCOUNTER — Non-Acute Institutional Stay (SKILLED_NURSING_FACILITY): Payer: Self-pay | Admitting: Internal Medicine

## 2024-08-01 ENCOUNTER — Encounter: Payer: Self-pay | Admitting: Internal Medicine

## 2024-08-01 DIAGNOSIS — D539 Nutritional anemia, unspecified: Secondary | ICD-10-CM | POA: Diagnosis not present

## 2024-08-01 DIAGNOSIS — E440 Moderate protein-calorie malnutrition: Secondary | ICD-10-CM | POA: Diagnosis not present

## 2024-08-01 DIAGNOSIS — N182 Chronic kidney disease, stage 2 (mild): Secondary | ICD-10-CM

## 2024-08-01 DIAGNOSIS — I129 Hypertensive chronic kidney disease with stage 1 through stage 4 chronic kidney disease, or unspecified chronic kidney disease: Secondary | ICD-10-CM | POA: Diagnosis not present

## 2024-08-01 NOTE — Patient Instructions (Signed)
 See assessment and plan under each diagnosis in the problem list and acutely for this visit

## 2024-08-01 NOTE — Progress Notes (Signed)
 NURSING HOME LOCATION:  [facility] ROOM NUMBER:  [id number]  CODE STATUS:  DNR  PCP: Landy Barnie RAMAN, NP   This is a nursing facility follow up visit of chronic medical diagnoses to document compliance with Regulation 483.30 (c) in The Long Term Care Survey Manual Phase 2 which mandates caregiver visit ( visits can alternate among physician, PA or NP as per statutes) within 10 days of 30 days / 60 days/ 90 days post admission to SNF date  .  Interim medical record and care since last SNF visit was updated with review of diagnostic studies and change in clinical status since last visit were documented.  HPI: She is a permanent resident of this facility with medical diagnoses of thyroid  dysfunction; essential hypertension; GERD; diverticulosis; history of asthma; and OSA. Significantly she has a permanent ostomy.  Most recent labs were performed 04/14/2024 and revealed hypocalcemia with a value of 8.6.  Protein/caloric malnutrition was suggested by albumin of 3 and total protein of 5.8; serially these are stable.  Macrocytic indices persist with an MCV of 105.4 in the context of a folic acid  level of 3.3 on 10/26/2023.  She is on a folate supplement at 1 mg daily.  She is also on B12 supplementation at 1000 mcg.  B12 level was lower limits of normal at 341 on 09/06/2023.  Review of systems: Other than a little bit of swelling ; she denies any active symptoms.  She stated that I am 89 and doing good, good. She describes good appetite & intake. She states she completes a jigsaw puzzles every 2 days &  I have about 15 .  Constitutional: No fever, significant weight change, fatigue  Eyes: No redness, discharge, pain, vision change ENT/mouth: No nasal congestion,  purulent discharge, earache, change in hearing, sore throat  Cardiovascular: No chest pain, palpitations, paroxysmal nocturnal dyspnea  Respiratory: No cough, sputum production, hemoptysis, DOE, significant snoring,  apnea   Gastrointestinal: No heartburn, dysphagia, abdominal pain, nausea /vomiting, rectal bleeding, melena, change in bowels Genitourinary: No dysuria, hematuria, pyuria, incontinence, nocturia Musculoskeletal: No joint stiffness, joint swelling, weakness, pain Dermatologic: No rash, pruritus, change in appearance of skin Neurologic: No dizziness, headache, syncope, seizures, numbness, tingling Psychiatric: No significant anxiety, depression, insomnia, anorexia Endocrine: No change in hair/skin/nails, excessive thirst, excessive hunger, excessive urination  Hematologic/lymphatic: No significant bruising, lymphadenopathy, abnormal bleeding Allergy/immunology: No itchy/watery eyes, significant sneezing, urticaria, angioedema  Physical exam:  Pertinent or positive findings: She is profoundly HOH.Slight ptosis of OD present. She ihas complete dentures. Abdomen protuberant ; osteomy present. 1/2+ peripheral edema.Pedal pulses not palpable.  General appearance: Adequately nourished; no acute distress, increased work of breathing is present.   Lymphatic: No lymphadenopathy about the head, neck, axilla. Eyes: No conjunctival inflammation or lid edema is present. There is no scleral icterus. Ears:  External ear exam shows no significant lesions or deformities.   Nose:  External nasal examination shows no deformity or inflammation. Nasal mucosa are pink and moist without lesions, exudates Neck:  No thyromegaly, masses, tenderness noted.    Heart:  Normal rate and regular rhythm. S1 and S2 normal without gallop, murmur, click, rub .  Lungs: Chest clear to auscultation without wheezes, rhonchi, rales, rubs. Abdomen: Bowel sounds are normal. Abdomen is soft and nontender with no organomegaly, hernias, masses. GU: Deferred  Extremities:  No cyanosis, clubbing  Neurologic exam :Balance, Rhomberg, finger to nose testing could not be completed due to clinical state Skin: Warm & dry w/o  tenting. No  significant lesions or rash.  See summary under each active problem in the Problem List with associated updated therapeutic plan :  Macrocytic anemia Anemia has resolved but macrocytosis persists.  She is on both folate and B12 supplementation.  These levels should be rechecked with a CBC next month.  Benign hypertension with chronic kidney disease, stage II BP controlled; GFR stable @ > 60. No change in antihypertensive medications   Unspecified protein-calorie malnutrition Ms. Rankin , Nutritionist will assess.  Albumin and total protein and be updated in the next 1-2 months.

## 2024-08-01 NOTE — Assessment & Plan Note (Signed)
 Anemia has resolved but macrocytosis persists.  She is on both folate and B12 supplementation.  These levels should be rechecked with a CBC next month.

## 2024-08-01 NOTE — Assessment & Plan Note (Addendum)
 BP controlled; GFR stable @ > 60. No change in antihypertensive medications

## 2024-08-01 NOTE — Assessment & Plan Note (Signed)
 Ms. Elner , Nutritionist will assess.  Albumin and total protein and be updated in the next 1-2 months.

## 2024-08-25 ENCOUNTER — Other Ambulatory Visit (HOSPITAL_COMMUNITY)
Admission: RE | Admit: 2024-08-25 | Discharge: 2024-08-25 | Disposition: A | Source: Skilled Nursing Facility | Attending: Adult Health | Admitting: Adult Health

## 2024-08-25 DIAGNOSIS — J3089 Other allergic rhinitis: Secondary | ICD-10-CM | POA: Insufficient documentation

## 2024-08-25 LAB — RESP PANEL BY RT-PCR (RSV, FLU A&B, COVID)  RVPGX2
Influenza A by PCR: NEGATIVE
Influenza B by PCR: NEGATIVE
Resp Syncytial Virus by PCR: NEGATIVE
SARS Coronavirus 2 by RT PCR: NEGATIVE

## 2024-09-10 ENCOUNTER — Encounter: Payer: Self-pay | Admitting: Adult Health

## 2024-09-10 ENCOUNTER — Non-Acute Institutional Stay: Payer: Self-pay | Admitting: Adult Health

## 2024-09-10 DIAGNOSIS — D539 Nutritional anemia, unspecified: Secondary | ICD-10-CM | POA: Diagnosis not present

## 2024-09-10 DIAGNOSIS — J4540 Moderate persistent asthma, uncomplicated: Secondary | ICD-10-CM | POA: Diagnosis not present

## 2024-09-10 DIAGNOSIS — M47816 Spondylosis without myelopathy or radiculopathy, lumbar region: Secondary | ICD-10-CM | POA: Diagnosis not present

## 2024-09-10 NOTE — Progress Notes (Unsigned)
 Location:  [facility] Room Number: 144 Place of Service:  SNF (31)   CODE STATUS: dnr   Allergies[1]  Chief Complaint  Patient presents with   Medical Management of Chronic Issues                   ***    HPI:    Past Medical History:  Diagnosis Date   Anxiety    Arthritis    Asthma    Cancer (HCC)    breast   Depression    Diverticulosis    GERD (gastroesophageal reflux disease)    HTN (hypertension)    Shortness of breath    exertion   Sleep apnea    STOP BANG 4   Thyroid  dysfunction     Past Surgical History:  Procedure Laterality Date   ABDOMINAL HYSTERECTOMY     arthroscopic surgery rt knee  1997   BREAST SURGERY  2001   LEFT/ CANCER    broken radius ulna  1990   left   broken right ankle  1976   EXPLORATORY LAPAROTOMY  08/24/2020   with partial sigmoidectomy creat of end colostomy and application of negative pressure dressing.      JOINT REPLACEMENT  right knee 2005 [place]   left knee  2004   rt foot bone spur removed  1992   THYROID  SURGERY  1983   TOTAL KNEE ARTHROPLASTY  01/22/2012   Procedure: TOTAL KNEE ARTHROPLASTY;  Surgeon: Taft FORBES Minerva, MD;  Location: [facility];  Service: Orthopedics;  Laterality: Left;   TUBAL LIGATION      Social History   Socioeconomic History   Marital status: Widowed    Spouse name: Not on file   Number of children: Not on file   Years of education: Not on file   Highest education level: Not on file  Occupational History   Not on file  Tobacco Use   Smoking status: Former   Smokeless tobacco: Former  Advertising Account Planner   Vaping status: Never Used  Substance and Sexual Activity   Alcohol use: No   Drug use: No   Sexual activity: Never  Other Topics Concern   Not on file  Social History Narrative   Not on file   Social Drivers of Health   Tobacco Use: Medium Risk (09/10/2024)   Patient History    Smoking Tobacco Use: Former    Smokeless Tobacco Use: Former    Passive  Exposure: Not on Stage Manager: Not on Ship Broker Insecurity: Not on file  Transportation Needs: Not on file  Physical Activity: Not on file  Stress: Not on file  Social Connections: Not on file  Intimate Partner Violence: Not on file  Depression (PHQ2-9): Low Risk (01/21/2024)   Depression (PHQ2-9)    PHQ-2 Score: 0  Alcohol Screen: Not on file  Housing: Not on file  Utilities: Not on file  Health Literacy: Not on file   Family History  Problem Relation Age of Onset   Pseudochol deficiency Neg Hx    Malignant hyperthermia Neg Hx    Hypotension Neg Hx    Anesthesia problems Neg Hx       VITAL SIGNS BP 133/69   Pulse 76   Temp 98.1 F (36.7 C)   Resp 18   Ht 4' 9 (1.448 m)   Wt 177 lb 8 oz (80.5 kg)   SpO2 98%   BMI 38.41 kg/m   Outpatient Encounter  Medications as of 09/10/2024  Medication Sig   acetaminophen  (TYLENOL ) 500 MG tablet Take 500 mg by mouth 3 (three) times daily. (Patient not taking: Reported on 05/28/2024)   acetaminophen  (TYLENOL ) 500 MG tablet Take 500 mg by mouth at bedtime. (Patient not taking: Reported on 05/28/2024)   acetaminophen  (TYLENOL ) 650 MG CR tablet Take 650 mg by mouth 3 (three) times daily.   Amino Acids-Protein Hydrolys (FEEDING SUPPLEMENT, PRO-STAT SUGAR FREE 64,) LIQD Take 30 mLs by mouth 3 (three) times daily with meals.   atenolol  (TENORMIN ) 50 MG tablet Take 50 mg by mouth every evening.   BREO ELLIPTA  200-25 MCG/INH AEPB Inhale 1 puff into the lungs daily.   Calcium Carb-Cholecalciferol  (CALCIUM 500 + D PO) Take 1 tablet by mouth daily.   Cholecalciferol  (VITAMIN D3) 50 MCG (2000 UT) TABS Take 1 tablet by mouth daily.   Cyanocobalamin  1000 MCG/ML KIT Inject as directed. Once A Day on the 10th of the Month   folic acid  (FOLVITE ) 1 MG tablet Take 1 mg by mouth daily.   furosemide (LASIX) 20 MG tablet Take 20 mg by mouth daily.   loratadine  (CLARITIN ) 10 MG tablet Take 10 mg by mouth daily. (Patient not taking:  Reported on 05/28/2024)   melatonin 5 MG TABS Take 5 mg by mouth.   Multiple Vitamins-Minerals (PRESERVISION AREDS 2) CAPS Take 1 capsule by mouth in the morning and at bedtime.   NON FORMULARY Diet:Regular   potassium chloride  SA (KLOR-CON ) 20 MEQ tablet Take 40 mEq by mouth 2 (two) times daily.   No facility-administered encounter medications on file as of 09/10/2024.     SIGNIFICANT DIAGNOSTIC EXAMS       ASSESSMENT/ PLAN:     Barnie Seip NP [facility] Adult Medicine  call [phone]     [1] No Known Allergies

## 2024-09-23 ENCOUNTER — Other Ambulatory Visit (HOSPITAL_COMMUNITY)
Admission: RE | Admit: 2024-09-23 | Discharge: 2024-09-23 | Disposition: A | Discharge status: Home / Self Care | Source: Skilled Nursing Facility | Attending: Adult Health | Admitting: Adult Health

## 2024-09-23 DIAGNOSIS — I10 Essential (primary) hypertension: Secondary | ICD-10-CM | POA: Insufficient documentation

## 2024-09-23 LAB — BASIC METABOLIC PANEL WITH GFR
Anion gap: 7 (ref 5–15)
BUN: 22 mg/dL (ref 8–23)
CO2: 28 mmol/L (ref 22–32)
Calcium: 9 mg/dL (ref 8.9–10.3)
Chloride: 106 mmol/L (ref 98–111)
Creatinine, Ser: 0.67 mg/dL (ref 0.44–1.00)
GFR, Estimated: >60 mL/min
Glucose, Bld: 85 mg/dL (ref 70–99)
Potassium: 4.3 mmol/L (ref 3.5–5.1)
Sodium: 140 mmol/L (ref 135–145)

## 2024-10-08 ENCOUNTER — Non-Acute Institutional Stay (SKILLED_NURSING_FACILITY): Payer: Self-pay | Admitting: Adult Health

## 2024-10-08 ENCOUNTER — Encounter: Payer: Self-pay | Admitting: Adult Health

## 2024-10-08 DIAGNOSIS — Z933 Colostomy status: Secondary | ICD-10-CM | POA: Diagnosis not present

## 2024-10-08 DIAGNOSIS — R600 Localized edema: Secondary | ICD-10-CM | POA: Diagnosis not present

## 2024-10-08 DIAGNOSIS — E538 Deficiency of other specified B group vitamins: Secondary | ICD-10-CM | POA: Diagnosis not present

## 2024-10-08 DIAGNOSIS — M810 Age-related osteoporosis without current pathological fracture: Secondary | ICD-10-CM

## 2024-10-08 NOTE — Progress Notes (Signed)
 " Location:  [facility] Room Number: 144 P Place of Service:  SNF (31)   CODE STATUS: DNR   Allergies[1]  Chief Complaint  Patient presents with   Medical Management of Chronic Issues        Postmenopausal osteoporosis: Vitamin B12: Bilateral lower extremity edema    HPI:  She is a 89 y.o. long term resident of this facility being seen for the management of her chronic illnesses:Postmenopausal osteoporosis: Vitamin B12: Bilateral lower extremity edema. There are no reports of uncontrolled pain. She is fairly independent with her adl care. Her blood pressure readings are stable.    Past Medical History:  Diagnosis Date   Anxiety    Arthritis    Asthma    Cancer (HCC)    breast   Depression    Diverticulosis    GERD (gastroesophageal reflux disease)    HTN (hypertension)    Shortness of breath    exertion   Sleep apnea    STOP BANG 4   Thyroid  dysfunction     Past Surgical History:  Procedure Laterality Date   ABDOMINAL HYSTERECTOMY     arthroscopic surgery rt knee  1997   BREAST SURGERY  2001   LEFT/ CANCER    broken radius ulna  1990   left   broken right ankle  1976   EXPLORATORY LAPAROTOMY  08/24/2020   with partial sigmoidectomy creat of end colostomy and application of negative pressure dressing.      JOINT REPLACEMENT  right knee 2005 [place]   left knee  2004   rt foot bone spur removed  1992   THYROID  SURGERY  1983   TOTAL KNEE ARTHROPLASTY  01/22/2012   Procedure: TOTAL KNEE ARTHROPLASTY;  Surgeon: Taft FORBES Minerva, MD;  Location: [facility];  Service: Orthopedics;  Laterality: Left;   TUBAL LIGATION      Social History   Socioeconomic History   Marital status: Widowed    Spouse name: Not on file   Number of children: Not on file   Years of education: Not on file   Highest education level: Not on file  Occupational History   Not on file  Tobacco Use   Smoking status: Former   Smokeless tobacco: Former  Advertising Account Planner    Vaping status: Never Used  Substance and Sexual Activity   Alcohol use: No   Drug use: No   Sexual activity: Never  Other Topics Concern   Not on file  Social History Narrative   Not on file   Social Drivers of Health   Tobacco Use: Medium Risk (10/08/2024)   Patient History    Smoking Tobacco Use: Former    Smokeless Tobacco Use: Former    Passive Exposure: Not on Stage Manager: Not on Ship Broker Insecurity: Not on file  Transportation Needs: Not on file  Physical Activity: Not on file  Stress: Not on file  Social Connections: Not on file  Intimate Partner Violence: Not on file  Depression (PHQ2-9): Low Risk (01/21/2024)   Depression (PHQ2-9)    PHQ-2 Score: 0  Alcohol Screen: Not on file  Housing: Not on file  Utilities: Not on file  Health Literacy: Not on file   Family History  Problem Relation Age of Onset   Pseudochol deficiency Neg Hx    Malignant hyperthermia Neg Hx    Hypotension Neg Hx    Anesthesia problems Neg Hx       VITAL  SIGNS BP (!) 145/67   Pulse 76   Temp 97.7 F (36.5 C)   Resp 20   Ht 4' 5 (1.346 m)   Wt 176 lb (79.8 kg)   SpO2 99%   BMI 44.05 kg/m   Outpatient Encounter Medications as of 10/08/2024  Medication Sig   acetaminophen  (TYLENOL ) 650 MG CR tablet Take 650 mg by mouth 3 (three) times daily.   Amino Acids-Protein Hydrolys (FEEDING SUPPLEMENT, PRO-STAT SUGAR FREE 64,) LIQD Take 30 mLs by mouth 3 (three) times daily with meals.   atenolol  (TENORMIN ) 50 MG tablet Take 50 mg by mouth every evening.   BREO ELLIPTA  200-25 MCG/INH AEPB Inhale 1 puff into the lungs daily.   Calcium Carb-Cholecalciferol  (CALCIUM 500 + D PO) Take 1 tablet by mouth daily. Once A Day 08:00 AM   Cholecalciferol  (VITAMIN D3) 50 MCG (2000 UT) TABS Take 1 tablet by mouth daily.   Cyanocobalamin  1000 MCG/ML KIT Inject as directed. Once A Day on the 26th of the Month 09:00 AM   folic acid  (FOLVITE ) 1 MG tablet Take 1 mg by mouth daily.  Special Instructions: for deficiency . Once A Day 08:00 AM   furosemide (LASIX) 20 MG tablet Take 20 mg by mouth daily. Special Instructions: bilateral lower extremity edema Once A Day 08:00 AM   melatonin 5 MG TABS Take 5 mg by mouth. At Bedtime 08:00 PM   Multiple Vitamins-Minerals (PRESERVISION AREDS 2) CAPS Take 1 capsule by mouth in the morning and at bedtime.   NON FORMULARY Diet:Regular   potassium chloride  SA (KLOR-CON ) 20 MEQ tablet Take 40 mEq by mouth 2 (two) times daily.   No facility-administered encounter medications on file as of 10/08/2024.     SIGNIFICANT DIAGNOSTIC EXAMS  LABS REVIEWED PREVIOUS    10-26-23: folate 3.3 12-04-23: glucose 75; bun 18; creat 0.62; k+ 4.3; na++ 139; ca 9.0; gfr >60 04-14-24: wbc 4.6; hgb 12.5; hct 39.3; mcv 105.4 plt 188; glucose 79; bun 19; creat 0.63; k+ 4.3 na++ 139; ca 8.6; gfr >60; protein 5.8 albumin 3.0    Review of Systems  Constitutional:  Negative for malaise/fatigue.  Respiratory:  Negative for cough and shortness of breath.   Cardiovascular:  Negative for chest pain, palpitations and leg swelling.  Gastrointestinal:  Negative for abdominal pain, constipation and heartburn.  Musculoskeletal:  Negative for back pain, joint pain and myalgias.  Skin: Negative.   Neurological:  Negative for dizziness.  Psychiatric/Behavioral:  The patient is not nervous/anxious.      Physical Exam Constitutional:      General: She is not in acute distress.    Appearance: She is well-developed. She is obese. She is not diaphoretic.  Neck:     Thyroid : No thyromegaly.  Cardiovascular:     Rate and Rhythm: Normal rate and regular rhythm.     Heart sounds: Normal heart sounds.  Pulmonary:     Effort: Pulmonary effort is normal. No respiratory distress.     Breath sounds: Normal breath sounds.  Abdominal:     General: Bowel sounds are normal. There is no distension.     Palpations: Abdomen is soft.     Tenderness: There is no abdominal  tenderness.     Comments: colostomy  Musculoskeletal:        General: Normal range of motion.     Cervical back: Neck supple.     Right lower leg: No edema.     Left lower leg: No edema.  Lymphadenopathy:  Cervical: No cervical adenopathy.  Skin:    General: Skin is warm and dry.  Neurological:     Mental Status: She is alert. Mental status is at baseline.     Comments: SLUMS 17/30  Psychiatric:        Mood and Affect: Mood normal.      ASSESSMENT/ PLAN:  TODAY  Postmenopausal osteoporosis: t score -3.253  2. Vitamin B12: level 347 is on monthly injections  3. Bilateral lower extremity edema: will continue lasix 20 mg daily with k+ 40 meq twice daily   PREVIOUS   4. Vascular dementia without behavioral disturbance: weight is 176 pounds and is stable.   5. Non-seasonal allergic rhinitis unspecified trigger: will continue claritin  10 mg daily   6. Essential hypertension: b/p 145/67 will continue temormin 50 mg daily  7. Hypokalemia: k+ 4.3 will continue 40 meq twice daily   8. Major depression recurrent chronic: is presently taking melatonin nightly for sleep   9. Aortoiliac atherosclerosis (08-21-20) is not on statin due to her advanced age  34. Folic acid  deficiency: level 3.3; will continue 1 mg daily supplement  11. History of large bowel obstruction: is status post colostomy   12. Degenerative disc disease, lumbar: is without pain; will continue tylenol  cr 650 mg three times daily   13. Moderate persistent asthma in adult: is on breo 200/25 mcg 1 puff daily  14. Macrocytic anemia: hgb 12.2  Will check cbc; cmp; vitamin B12 and folate     Cheryl Fenster NP [facility]  call [phone]      [1] No Known Allergies  "

## 2024-10-17 ENCOUNTER — Other Ambulatory Visit (HOSPITAL_COMMUNITY)
Admission: RE | Admit: 2024-10-17 | Discharge: 2024-10-17 | Disposition: A | Source: Skilled Nursing Facility | Attending: Adult Health | Admitting: Adult Health

## 2024-10-17 DIAGNOSIS — I129 Hypertensive chronic kidney disease with stage 1 through stage 4 chronic kidney disease, or unspecified chronic kidney disease: Secondary | ICD-10-CM | POA: Diagnosis present

## 2024-10-17 LAB — CBC
HCT: 36.8 % (ref 36.0–46.0)
Hemoglobin: 12 g/dL (ref 12.0–15.0)
MCH: 34 pg (ref 26.0–34.0)
MCHC: 32.6 g/dL (ref 30.0–36.0)
MCV: 104.2 fL — ABNORMAL HIGH (ref 80.0–100.0)
Platelets: 181 K/uL (ref 150–400)
RBC: 3.53 MIL/uL — ABNORMAL LOW (ref 3.87–5.11)
RDW: 12.6 % (ref 11.5–15.5)
WBC: 5.1 K/uL (ref 4.0–10.5)
nRBC: 0 % (ref 0.0–0.2)

## 2024-10-17 LAB — FOLATE: Folate: 17 ng/mL

## 2024-10-17 LAB — VITAMIN B12: Vitamin B-12: 499 pg/mL (ref 180–914)

## 2024-10-17 LAB — COMPREHENSIVE METABOLIC PANEL WITH GFR
ALT: 8 U/L (ref 0–44)
AST: 15 U/L (ref 15–41)
Albumin: 3.4 g/dL — ABNORMAL LOW (ref 3.5–5.0)
Alkaline Phosphatase: 56 U/L (ref 38–126)
Anion gap: 13 (ref 5–15)
BUN: 22 mg/dL (ref 8–23)
CO2: 23 mmol/L (ref 22–32)
Calcium: 9.1 mg/dL (ref 8.9–10.3)
Chloride: 105 mmol/L (ref 98–111)
Creatinine, Ser: 0.75 mg/dL (ref 0.44–1.00)
GFR, Estimated: >60 mL/min
Glucose, Bld: 74 mg/dL (ref 70–99)
Potassium: 4.4 mmol/L (ref 3.5–5.1)
Sodium: 141 mmol/L (ref 135–145)
Total Bilirubin: 0.4 mg/dL (ref 0.0–1.2)
Total Protein: 5.8 g/dL — ABNORMAL LOW (ref 6.5–8.1)
